# Patient Record
Sex: Female | Born: 1970
Health system: Southern US, Community
[De-identification: ages and names within clinical notes are randomized; demographics above are authoritative.]

## PROBLEM LIST (undated history)

## (undated) DIAGNOSIS — K922 Gastrointestinal hemorrhage, unspecified: Secondary | ICD-10-CM

## (undated) DIAGNOSIS — F259 Schizoaffective disorder, unspecified: Secondary | ICD-10-CM

## (undated) DIAGNOSIS — K921 Melena: Secondary | ICD-10-CM

## (undated) DIAGNOSIS — F319 Bipolar disorder, unspecified: Secondary | ICD-10-CM

## (undated) DIAGNOSIS — T8859XA Other complications of anesthesia, initial encounter: Secondary | ICD-10-CM

## (undated) DIAGNOSIS — T4145XA Adverse effect of unspecified anesthetic, initial encounter: Secondary | ICD-10-CM

## (undated) DIAGNOSIS — C569 Malignant neoplasm of unspecified ovary: Secondary | ICD-10-CM

## (undated) DIAGNOSIS — I2510 Atherosclerotic heart disease of native coronary artery without angina pectoris: Secondary | ICD-10-CM

## (undated) DIAGNOSIS — K219 Gastro-esophageal reflux disease without esophagitis: Secondary | ICD-10-CM

## (undated) DIAGNOSIS — Z9889 Other specified postprocedural states: Secondary | ICD-10-CM

## (undated) DIAGNOSIS — K5792 Diverticulitis of intestine, part unspecified, without perforation or abscess without bleeding: Secondary | ICD-10-CM

## (undated) DIAGNOSIS — R112 Nausea with vomiting, unspecified: Secondary | ICD-10-CM

## (undated) DIAGNOSIS — Z87442 Personal history of urinary calculi: Secondary | ICD-10-CM

## (undated) DIAGNOSIS — E8940 Asymptomatic postprocedural ovarian failure: Secondary | ICD-10-CM

## (undated) DIAGNOSIS — L309 Dermatitis, unspecified: Secondary | ICD-10-CM

## (undated) DIAGNOSIS — D369 Benign neoplasm, unspecified site: Secondary | ICD-10-CM

## (undated) DIAGNOSIS — E785 Hyperlipidemia, unspecified: Secondary | ICD-10-CM

## (undated) DIAGNOSIS — R109 Unspecified abdominal pain: Secondary | ICD-10-CM

## (undated) DIAGNOSIS — G8929 Other chronic pain: Secondary | ICD-10-CM

## (undated) DIAGNOSIS — F988 Other specified behavioral and emotional disorders with onset usually occurring in childhood and adolescence: Secondary | ICD-10-CM

## (undated) HISTORY — DX: Schizoaffective disorder, unspecified: F25.9

## (undated) HISTORY — DX: Bipolar disorder, unspecified: F31.9

## (undated) HISTORY — DX: Benign neoplasm, unspecified site: D36.9

## (undated) HISTORY — DX: Asymptomatic postprocedural ovarian failure: E89.40

## (undated) HISTORY — DX: Atherosclerotic heart disease of native coronary artery without angina pectoris: I25.10

## (undated) HISTORY — DX: Dermatitis, unspecified: L30.9

## (undated) HISTORY — DX: Melena: K92.1

## (undated) HISTORY — DX: Other chronic pain: G89.29

## (undated) HISTORY — DX: Gastrointestinal hemorrhage, unspecified: K92.2

## (undated) HISTORY — DX: Other specified behavioral and emotional disorders with onset usually occurring in childhood and adolescence: F98.8

## (undated) HISTORY — DX: Other chronic pain: R10.9

## (undated) HISTORY — DX: Hyperlipidemia, unspecified: E78.5

## (undated) HISTORY — PX: TUBAL LIGATION: SHX77

## (undated) HISTORY — DX: Malignant neoplasm of unspecified ovary: C56.9

---

## 1998-04-30 ENCOUNTER — Encounter: Admission: RE | Admit: 1998-04-30 | Discharge: 1998-07-29 | Payer: Self-pay | Admitting: *Deleted

## 2002-01-04 HISTORY — PX: APPENDECTOMY: SHX54

## 2005-05-21 ENCOUNTER — Inpatient Hospital Stay (HOSPITAL_COMMUNITY): Admission: RE | Admit: 2005-05-21 | Discharge: 2005-05-23 | Payer: Self-pay | Admitting: *Deleted

## 2005-05-22 ENCOUNTER — Ambulatory Visit: Payer: Self-pay | Admitting: Psychiatry

## 2006-11-17 ENCOUNTER — Encounter: Admission: RE | Admit: 2006-11-17 | Discharge: 2006-11-17 | Payer: Self-pay | Admitting: Family Medicine

## 2006-11-22 ENCOUNTER — Emergency Department (HOSPITAL_COMMUNITY): Admission: EM | Admit: 2006-11-22 | Discharge: 2006-11-22 | Payer: Self-pay | Admitting: Emergency Medicine

## 2008-01-05 DIAGNOSIS — E8940 Asymptomatic postprocedural ovarian failure: Secondary | ICD-10-CM

## 2008-01-05 DIAGNOSIS — C569 Malignant neoplasm of unspecified ovary: Secondary | ICD-10-CM

## 2008-01-05 DIAGNOSIS — D369 Benign neoplasm, unspecified site: Secondary | ICD-10-CM

## 2008-01-05 HISTORY — DX: Asymptomatic postprocedural ovarian failure: E89.40

## 2008-01-05 HISTORY — DX: Benign neoplasm, unspecified site: D36.9

## 2008-01-05 HISTORY — DX: Malignant neoplasm of unspecified ovary: C56.9

## 2008-01-05 HISTORY — PX: ABDOMINAL HYSTERECTOMY: SHX81

## 2008-05-01 ENCOUNTER — Inpatient Hospital Stay (HOSPITAL_COMMUNITY): Admission: EM | Admit: 2008-05-01 | Discharge: 2008-05-06 | Payer: Self-pay | Admitting: Emergency Medicine

## 2008-05-02 ENCOUNTER — Ambulatory Visit: Payer: Self-pay | Admitting: Internal Medicine

## 2008-05-03 ENCOUNTER — Encounter: Payer: Self-pay | Admitting: Internal Medicine

## 2008-05-04 ENCOUNTER — Ambulatory Visit: Payer: Self-pay | Admitting: Gastroenterology

## 2008-05-06 ENCOUNTER — Ambulatory Visit: Payer: Self-pay | Admitting: Gastroenterology

## 2008-05-06 HISTORY — PX: ESOPHAGOGASTRODUODENOSCOPY: SHX1529

## 2008-05-17 ENCOUNTER — Encounter: Payer: Self-pay | Admitting: Gastroenterology

## 2008-06-10 ENCOUNTER — Telehealth (INDEPENDENT_AMBULATORY_CARE_PROVIDER_SITE_OTHER): Payer: Self-pay | Admitting: *Deleted

## 2008-10-28 ENCOUNTER — Telehealth (INDEPENDENT_AMBULATORY_CARE_PROVIDER_SITE_OTHER): Payer: Self-pay

## 2008-10-28 ENCOUNTER — Emergency Department (HOSPITAL_COMMUNITY): Admission: EM | Admit: 2008-10-28 | Discharge: 2008-10-28 | Payer: Self-pay | Admitting: Emergency Medicine

## 2008-10-28 ENCOUNTER — Encounter: Payer: Self-pay | Admitting: Gastroenterology

## 2008-10-28 DIAGNOSIS — K589 Irritable bowel syndrome without diarrhea: Secondary | ICD-10-CM | POA: Insufficient documentation

## 2008-10-28 DIAGNOSIS — R109 Unspecified abdominal pain: Secondary | ICD-10-CM | POA: Insufficient documentation

## 2008-10-30 ENCOUNTER — Encounter: Payer: Self-pay | Admitting: Gastroenterology

## 2008-11-12 ENCOUNTER — Ambulatory Visit: Payer: Self-pay | Admitting: Internal Medicine

## 2008-11-12 ENCOUNTER — Encounter: Payer: Self-pay | Admitting: Gastroenterology

## 2008-11-12 DIAGNOSIS — R933 Abnormal findings on diagnostic imaging of other parts of digestive tract: Secondary | ICD-10-CM | POA: Insufficient documentation

## 2008-11-12 DIAGNOSIS — K5732 Diverticulitis of large intestine without perforation or abscess without bleeding: Secondary | ICD-10-CM | POA: Insufficient documentation

## 2008-11-12 LAB — CONVERTED CEMR LAB
BUN: 9 mg/dL (ref 6–23)
Basophils Absolute: 0 10*3/uL (ref 0.0–0.1)
Basophils Relative: 1 % (ref 0–1)
CO2: 30 meq/L (ref 19–32)
Calcium: 9.1 mg/dL (ref 8.4–10.5)
Chloride: 104 meq/L (ref 96–112)
Creatinine, Ser: 0.75 mg/dL (ref 0.40–1.20)
Eosinophils Absolute: 0.5 10*3/uL (ref 0.0–0.7)
Eosinophils Relative: 7 % — ABNORMAL HIGH (ref 0–5)
Glucose, Bld: 82 mg/dL (ref 70–99)
HCT: 37.1 % (ref 36.0–46.0)
Hemoglobin: 12.7 g/dL (ref 12.0–15.0)
Lymphocytes Relative: 19 % (ref 12–46)
Lymphs Abs: 1.5 10*3/uL (ref 0.7–4.0)
MCHC: 34.3 g/dL (ref 30.0–36.0)
MCV: 92.7 fL (ref 78.0–100.0)
Monocytes Absolute: 0.6 10*3/uL (ref 0.1–1.0)
Monocytes Relative: 8 % (ref 3–12)
Neutro Abs: 5.1 10*3/uL (ref 1.7–7.7)
Neutrophils Relative %: 66 % (ref 43–77)
Platelets: 212 10*3/uL (ref 150–400)
Potassium: 4.3 meq/L (ref 3.5–5.3)
RBC: 4 M/uL (ref 3.87–5.11)
RDW: 12.8 % (ref 11.5–15.5)
Sodium: 140 meq/L (ref 135–145)
WBC: 7.8 10*3/uL (ref 4.0–10.5)

## 2008-11-14 ENCOUNTER — Encounter: Payer: Self-pay | Admitting: Gastroenterology

## 2008-11-15 ENCOUNTER — Encounter: Payer: Self-pay | Admitting: Internal Medicine

## 2008-12-04 HISTORY — PX: COLONOSCOPY: SHX174

## 2008-12-16 ENCOUNTER — Emergency Department (HOSPITAL_COMMUNITY): Admission: EM | Admit: 2008-12-16 | Discharge: 2008-12-16 | Payer: Self-pay | Admitting: Emergency Medicine

## 2008-12-16 ENCOUNTER — Telehealth (INDEPENDENT_AMBULATORY_CARE_PROVIDER_SITE_OTHER): Payer: Self-pay

## 2008-12-17 ENCOUNTER — Encounter: Payer: Self-pay | Admitting: Gastroenterology

## 2008-12-20 ENCOUNTER — Ambulatory Visit (HOSPITAL_COMMUNITY): Admission: RE | Admit: 2008-12-20 | Discharge: 2008-12-20 | Payer: Self-pay | Admitting: Gastroenterology

## 2008-12-20 ENCOUNTER — Ambulatory Visit: Payer: Self-pay | Admitting: Gastroenterology

## 2008-12-26 ENCOUNTER — Encounter: Payer: Self-pay | Admitting: Gastroenterology

## 2009-01-02 ENCOUNTER — Other Ambulatory Visit: Payer: Self-pay | Admitting: General Surgery

## 2009-01-04 HISTORY — PX: SUBTOTAL COLECTOMY: SHX855

## 2009-01-05 ENCOUNTER — Inpatient Hospital Stay (HOSPITAL_COMMUNITY): Admission: EM | Admit: 2009-01-05 | Discharge: 2009-01-12 | Payer: Self-pay | Admitting: Emergency Medicine

## 2009-01-06 ENCOUNTER — Encounter (INDEPENDENT_AMBULATORY_CARE_PROVIDER_SITE_OTHER): Payer: Self-pay | Admitting: General Surgery

## 2009-02-04 ENCOUNTER — Ambulatory Visit: Payer: Self-pay | Admitting: Gastroenterology

## 2009-02-04 DIAGNOSIS — D126 Benign neoplasm of colon, unspecified: Secondary | ICD-10-CM | POA: Insufficient documentation

## 2009-04-21 ENCOUNTER — Ambulatory Visit (HOSPITAL_COMMUNITY): Admission: RE | Admit: 2009-04-21 | Discharge: 2009-04-21 | Payer: Self-pay | Admitting: Psychiatry

## 2009-06-12 ENCOUNTER — Encounter: Admission: RE | Admit: 2009-06-12 | Discharge: 2009-09-10 | Payer: Self-pay | Admitting: Obstetrics and Gynecology

## 2009-06-17 ENCOUNTER — Emergency Department (HOSPITAL_COMMUNITY): Admission: EM | Admit: 2009-06-17 | Discharge: 2009-06-17 | Payer: Self-pay | Admitting: Emergency Medicine

## 2009-07-22 ENCOUNTER — Emergency Department (HOSPITAL_COMMUNITY): Admission: EM | Admit: 2009-07-22 | Discharge: 2009-07-22 | Payer: Self-pay | Admitting: Emergency Medicine

## 2009-08-25 ENCOUNTER — Emergency Department (HOSPITAL_COMMUNITY): Admission: EM | Admit: 2009-08-25 | Discharge: 2009-08-25 | Payer: Self-pay | Admitting: Emergency Medicine

## 2009-08-30 ENCOUNTER — Emergency Department (HOSPITAL_COMMUNITY): Admission: EM | Admit: 2009-08-30 | Discharge: 2009-08-30 | Payer: Self-pay | Admitting: Emergency Medicine

## 2009-09-12 ENCOUNTER — Encounter: Payer: Self-pay | Admitting: Gastroenterology

## 2009-11-24 ENCOUNTER — Emergency Department (HOSPITAL_COMMUNITY): Admission: EM | Admit: 2009-11-24 | Discharge: 2009-11-24 | Payer: Self-pay | Admitting: Emergency Medicine

## 2010-01-04 DIAGNOSIS — L309 Dermatitis, unspecified: Secondary | ICD-10-CM

## 2010-01-04 HISTORY — DX: Dermatitis, unspecified: L30.9

## 2010-02-03 NOTE — Consult Note (Signed)
Summary: Consultation Report  Consultation Report   Imported By: Diana Eves 05/03/2008 14:58:10  _____________________________________________________________________  External Attachment:    Type:   Image     Comment:   External Document

## 2010-02-03 NOTE — Progress Notes (Signed)
  Phone Note Call from Patient   Caller: Patient Summary of Call: Pt called from [facility] ER, said she is scheduled for TCS Fri and has not gotten her Trilyte Prep yet. She is requesting the pill. Said there is no way she can drink the solution and she really does need to have the TCS. Please advise. Initial call taken by: Cloria Spring LPN,  December 16, 2008 2:57 PM     Appended Document:  Osmoprep okay. Pt NEEDS TO DRINL PLENTY OF FLUIDS. URINE SHOULD BE LIGHT YELLOW.  Appended Document:  LMOM for pt to call.  Appended Document:  Pt informed. [facility] [place].

## 2010-02-03 NOTE — Assessment & Plan Note (Signed)
Summary: diverticulitis,consult for colonoscopy/ams   Visit Type:  f/u Primary Care Provider:  Belva Agee, NP  Chief Complaint:  diverticulitis.  History of Present Illness: [name] is a pleasant 40 y/o female with h/o recurrent diverticulitis who presents for follow-up visit.  She called on 10/28/08 with c/o recurrent llq pain, loose dark stools similar to her previous diverticulitis episodes.  She was hospitalized in 4/10 with diverticulitis.  She had episode in late 2008 or early 2009 at [facility] as well.  Dr. Darrick Penna recommended STAT CT A/P but before this was able to be done, she went to the ED at [facility] for evaluation.  CT from 10/28/08 showed extensive inflammatory change involving the sigmoid colon most c/w sigmoid diverticulitis.  No definite abscess or evidence of perforation was seen.  WBC 11,800, H/H 12.9/36.8, postassium 3.5.  She was given Augmentin for 7 days.  She continues to have low-grade fever 99-100 Farenheit.  Her LLQ pain is improved but persistent.  She has some radiation to the mid abdomen as well.  She cannot tolerate the oral Dilaudid she was given.  She says it is too strong.  Denies n/v.  She is having one loose dark stool daily.  She has mucous in the stool but no frank blood.  She c/o feeling fatigued.     Current Medications (verified): 1)  Lithium .... One Tablet At Bedtime 2)  Lamictal .... One Tablet Daily 3)  Alprazolam 2 Mg Tabs (Alprazolam) .... Take 1 Tablet By Mouth Three Times A Day 4)  Ambien Cr 12.5 Mg Cr-Tabs (Zolpidem Tartrate) .... Take 1 Tab By Mouth At Bedtime As Needed 5)  Macrobid 100 Mg Caps (Nitrofurantoin Monohyd Macro) .... One Tablet Nightly  Allergies (verified): No Known Drug Allergies  Past History:  Past Medical History: Bipolar d/o with psychiatric admission in 2007 Frequent UTIs Recurrent diverticulitis, first episode at [facility] requiring hospitalization about 2 years ago.  Hospitalized in 4/10 at [facility] with right sided  diverticulitis and possible focal colitis.  Current CT 10/10, sigmoid diverticulitis. EGD 05/06/08, Dr. Darrick Penna, for coffee-ground emesis and hematemesis, showed hiatal hernia only.  Past Surgical History: C-section Appendectomy Hysterectomy  Family History: Negative for chronic GI illnesses, liver dz, crc.  Social History: Married.  Lives in [place] with husband and  3 sons, ages 68, 25, 6.   Currently unemployed. 1/2 ppd.  No alcohol or drug use.  Review of Systems General:  Complains of fever and fatigue; denies chills, sweats, anorexia, weakness, and weight loss. Eyes:  Denies vision loss. ENT:  Denies nasal congestion, sore throat, hoarseness, and difficulty swallowing. CV:  Denies chest pains, angina, palpitations, dyspnea on exertion, and peripheral edema. Resp:  Denies dyspnea at rest, dyspnea with exercise, and cough. GI:  Complains of abdominal pain, gas/bloating, diarrhea, and change in bowel habits; denies difficulty swallowing, pain on swallowing, nausea, indigestion/heartburn, vomiting, jaundice, constipation, bloody BM's, black BMs, and fecal incontinence. GU:  Denies urinary burning and blood in urine. MS:  Denies joint pain / LOM. Derm:  Denies rash and itching. Neuro:  Denies frequent headaches, memory loss, and confusion. Psych:  Denies depression and anxiety. Endo:  Denies unusual weight change. Heme:  Denies bruising and bleeding. Allergy:  Denies hives and rash.  Vital Signs:  Patient profile:   40 year old female Height:      63 inches Weight:      114 pounds BMI:     20.27 Temp:     98.7 degrees F oral  Pulse rate:   64 / minute BP sitting:   100 / 70  (left arm) Cuff size:   regular  Vitals Entered By: Cloria Spring LPN (November 12, 2008 9:29 AM)  Physical Exam  General:  Well developed, well nourished, no acute distress. Head:  Normocephalic and atraumatic. Eyes:  Conjunctivae pink, no scleral icterus.  Mouth:  Oropharyngeal mucosa moist,  pink.  No lesions, erythema or exudate.    Neck:  Supple; no masses or thyromegaly. Lungs:  Clear throughout to auscultation. Heart:  Regular rate and rhythm; no murmurs, rubs,  or bruits. Abdomen:  Soft.  Flat.  ND.  Positive BS.  Moderate tenderness in llq and suprapubic region.  No rebound.  Subjective guarding.  No HSM or masses. Extremities:  No clubbing, cyanosis, edema or deformities noted. Neurologic:  Alert and  oriented x4;  grossly normal neurologically. Skin:  Intact without significant lesions or rashes. Cervical Nodes:  No significant cervical adenopathy. Psych:  Alert and cooperative. Normal mood and affect.  Impression & Recommendations:  Problem # 1:  DIVERTICULITIS, COLON (ICD-562.11) Recurrent diverticulitis, third epidsode. 2 episodes in the last 7 months. Notably her last episode was right-sided, the current episode is in  sigmoid region. We'll request records from [facility] to see where the diverticulitis was located during her first episode around 2008. She been off antibiotics now for one week. She continues to have ongoing left lower quadrant abdominal pain and low-grade fever. She notes that her symptoms have improved but are still persistent. She has moderate pain on exam. She is afebrile currently. At this point in time she wants to continue outpatient treatment. She likely has an element of persisting diverticulitis having received a 7 day course of Augmentin. We discussed the possibility of starting Cipro and Flagyl along with blood work, however if she is not significantly improved in the next 48-72 hours then would pursue repeat CT scan to rule out diverticular abscess. Our goal is to plan on colonoscopy approximately the third week of December assuming that this current episode of diverticulitis resolves.  Colonoscopy to be performed in near future.  Risks, alternatives, and benefits including but not limited to the risk of reaction to medication,  bleeding, infection, and perforation were addressed.  Patient voiced understanding and provided verbal consent.  Orders: T-CBC w/Diff ([id number]) T-Basic Metabolic Panel ([id number]) Est. Patient Level IV (29562) Prescriptions: OXYCODONE-ACETAMINOPHEN 5-325 MG TABS (OXYCODONE-ACETAMINOPHEN) one by mouth every 4-6 hours as needed pain, may make drowsy  #30 x 0   Entered and Authorized by:   Leanna Battles. Dixon Boos   Signed by:   Leanna Battles Lavarius Doughten PA-C on 11/12/2008   Method used:   Print then Give to Patient   RxID:   [id number] METRONIDAZOLE 500 MG TABS (METRONIDAZOLE) one by mouth three times a day with food for two weeks  #42 x 0   Entered and Authorized by:   Leanna Battles. Dixon Boos   Signed by:   Leanna Battles Ulysess Witz PA-C on 11/12/2008   Method used:   Print then Give to Patient   RxID:   [id number] CIPROFLOXACIN HCL 500 MG TABS (CIPROFLOXACIN HCL) one by mouth two times a day for two weeks  #28 x 0   Entered and Authorized by:   Leanna Battles. Dixon Boos   Signed by:   Leanna Battles Kandra Graven PA-C on 11/12/2008   Method used:   Print then Give to Patient   RxID:   [id number]  Appended Document: diverticulitis,consult for colonoscopy/ams Reviewed records from [facility] CT 02/23/2006:  Prelim report stated inflammatory changes involving mid transverse colon and few diverticula seen there s/o diverticulitis.  Final report did not mention diverticulitis.

## 2010-02-03 NOTE — Progress Notes (Signed)
Summary: abd pain, dark stools  Phone Note Call from Patient Call back at Home Phone [phone]   Caller: Patient Summary of Call: pt called- feels like she is having an episode of diverticulitis. she is having nausea, lower left abd pain and dark stools. She doesnt know if she has a fever or not. pt has ov appt on 11/04/08. wants to know if there is anything she can do now. please advise Initial call taken by: Hendricks Limes LPN,  October 28, 2008 9:11 AM     Appended Document: abd pain, dark stools Pt needs STAT CT Scan A/P w/ iv contrast, Dx: abd pain and diarhhea, UJ:[id number]. nEEDS cdiff TOXIN X3, AND FECAL LACTOFERRIN.  Appended Document: abd pain, dark stools pt aware- stool containers at front desk. pt to come to [facility] for CT.   Appended Document: abd pain, dark stools pt instructed to go to [facility]. order faxed to radiology for stat ct scan

## 2010-02-03 NOTE — Assessment & Plan Note (Signed)
Summary: ABD PAIN/ SUBTOTAL COLECTOMY-DIVERTICULITIS JAN 3   Visit Type:  Follow-up Visit Primary Care Provider:  [facility]  Chief Complaint:  F/U abd pain.  History of Present Illness: Since surgery, having back pain. Gets up in the middle of the night. No numbness or tinlging in legs. No lower extremity weakness. Bowels are still kind of loose. Taking Benefiber tabs as needed. BM 3x/day. Still c/o pain: Rx-Vicodon no help, Percocet: no help.   Current Medications (verified): 1)  Lithium .... One Tablet At Bedtime 2)  Alprazolam 2 Mg Tabs (Alprazolam) .... Take 1 Tablet By Mouth Three Times A Day As Needed 3)  Ambien Cr 12.5 Mg Cr-Tabs (Zolpidem Tartrate) .... Take 1 Tab By Mouth At Bedtime As Needed 4)  Oxycodone-Acetaminophen 5-325 Mg Tabs (Oxycodone-Acetaminophen) .... One By Mouth Every 4-6 Hours As Needed Pain, May Make Drowsy  Allergies (verified): No Known Drug Allergies  Past History:  Past Medical History: Bipolar d/o with psychiatric admission in 2007 Frequent UTIs DEC 2010 TCS: SIMPLE ADENOMA, NO MICROSCOPIC COLITIS CHRONIC Abdominal pain, multifactorial: IBS-diarrhea predominant, bile salt-induced diarrhea? EGD 05/06/08, Dr. Darrick Penna, for coffee-ground emesis and hematemesis, showed hiatal hernia only. Recurrent diverticulitis, first episode at [facility] requiring hospitalization about 2 years ago. APR 2010: right sided diverticulitis,  CT 10/10, sigmoid diverticulitis-->subtotal colectomy 2011, Dr. Lovell Sheehan.  Past Surgical History: C-section Appendectomy Hysterectomy Subtotal colectomy 2011, Dr. Lovell Sheehan.  Vital Signs:  Patient profile:   40 year old female Height:      63 inches Weight:      106.50 pounds BMI:     18.93 Temp:     98.0 degrees F oral Pulse rate:   72 / minute BP sitting:   100 / 70  (left arm) Cuff size:   regular  Vitals Entered By: Cloria Spring LPN (February 04, 2009 9:37 AM)  Physical Exam  General:  Well developed, well nourished, no acute  distress. Head:  Normocephalic and atraumatic. Eyes:  PERRLA, no icterus. Lungs:  Clear throughout to auscultation. Heart:  Regular rate and rhythm; no murmurs Abdomen:  Soft.  Flat.  ND.  Positive BS.  Moderate tenderness x all 4 quadrants.  No rebound or guarding.    Impression & Recommendations:  Problem # 1:  ABDOMINAL PAIN (ICD-789.00)  Benefiber at least once a day. May liberalize diet.  2 TUMS with every meal.  Refer to [facility] OR DR. Lovell Sheehan for back pain. OPV in 4 mos.  Orders: Est. Patient Level III (16109)  Problem # 2:  BENIGN NEOPLASM OF COLON (ICD-211.3) S/P SUBTOTAL COLECTOMY. FLEX SIG in 5 years. Continue fiber. OPV as needed. 1st degree relatives need TCS at age 28 then every 5 years.  CC: PCP and Dr. Lovell Sheehan Prescriptions: AMITRIPTYLINE HCL 25 MG TABS (AMITRIPTYLINE HCL) 1 by mouth at bedtime for 7 days then 2 by mouth qhs  #60 x 5   Entered and Authorized by:   West Bali MD   Signed by:   West Bali MD on 02/04/2009   Method used:   [phone][facility] (retail)       [address]       [place], [place]  [address]       Ph: [phone] or [phone]       Fax: [phone]   RxID:   [id number]

## 2010-02-03 NOTE — Letter (Signed)
Summary: Internal Other /Change in Prep /TCS  Internal Other /Change in Prep /TCS   Imported By: Cloria Spring LPN 61/60/7371 06:26:94  _____________________________________________________________________  External Attachment:    Type:   Image     Comment:   External Document

## 2010-02-03 NOTE — Letter (Signed)
Summary: MEDICAL RECORDS TO LAWYERS OFFICE  MEDICAL RECORDS TO LAWYERS OFFICE   Imported By: Rexene Alberts 09/12/2009 13:59:29  _____________________________________________________________________  External Attachment:    Type:   Image     Comment:   External Document

## 2010-02-03 NOTE — Letter (Signed)
Summary: ER NOTES  ER NOTES   Imported By: Diana Eves 11/15/2008 13:39:16  _____________________________________________________________________  External Attachment:    Type:   Image     Comment:   External Document

## 2010-02-03 NOTE — Miscellaneous (Signed)
Summary: c-diff and wbc order

## 2010-02-03 NOTE — Letter (Signed)
Summary: CT SCAN ORDER  CT SCAN ORDER   Imported By: Ave Filter 10/30/2008 08:47:44  _____________________________________________________________________  External Attachment:    Type:   Image     Comment:   External Document

## 2010-02-03 NOTE — Medication Information (Signed)
Summary: zofran prior auth  zofran prior auth   Imported By: Hendricks Limes LPN 16/10/9602 54:09:81  _____________________________________________________________________  External Attachment:    Type:   Image     Comment:   External Document

## 2010-02-03 NOTE — Progress Notes (Signed)
Summary: Ruth Duncan Requesting appt with SM only  Phone Note Call from Patient Call back at [facility] Phone [phone]   Caller: Patient Summary of Call: Pt called and rs her hosp f/u with [place].  She does not want RMR as her doc even though he did the initial consult.  She said that SM did the procedure and she really likes her.  She has no problem with RMR but really likes SM and feels comfortable with her.  She changed her appt and would not rs with RMR. Initial call taken by: Elinor Parkinson,  June 10, 2008 1:55 PM      Appended Document: Ruth Duncan Requesting appt with SM only Ok.

## 2010-02-03 NOTE — Letter (Signed)
Summary: TCS ORDER  TCS ORDER   Imported By: Ave Filter 11/14/2008 10:40:55  _____________________________________________________________________  External Attachment:    Type:   Image     Comment:   External Document

## 2010-03-17 LAB — DIFFERENTIAL
Basophils Absolute: 0.1 10*3/uL (ref 0.0–0.1)
Basophils Relative: 1 % (ref 0–1)
Eosinophils Absolute: 0.1 10*3/uL (ref 0.0–0.7)
Eosinophils Relative: 2 % (ref 0–5)
Lymphocytes Relative: 19 % (ref 12–46)
Lymphs Abs: 1.4 10*3/uL (ref 0.7–4.0)
Monocytes Absolute: 0.8 10*3/uL (ref 0.1–1.0)
Monocytes Relative: 10 % (ref 3–12)
Neutro Abs: 5.3 10*3/uL (ref 1.7–7.7)
Neutrophils Relative %: 69 % (ref 43–77)

## 2010-03-17 LAB — COMPREHENSIVE METABOLIC PANEL
ALT: 11 U/L (ref 0–35)
AST: 19 U/L (ref 0–37)
Albumin: 3.4 g/dL — ABNORMAL LOW (ref 3.5–5.2)
Alkaline Phosphatase: 64 U/L (ref 39–117)
BUN: 3 mg/dL — ABNORMAL LOW (ref 6–23)
CO2: 24 mEq/L (ref 19–32)
Calcium: 8.8 mg/dL (ref 8.4–10.5)
Chloride: 106 mEq/L (ref 96–112)
Creatinine, Ser: 0.77 mg/dL (ref 0.4–1.2)
GFR calc Af Amer: >60 mL/min (ref >60)
GFR calc non Af Amer: >60 mL/min (ref >60)
Glucose, Bld: 84 mg/dL (ref 70–99)
Potassium: 3.5 mEq/L (ref 3.5–5.1)
Sodium: 137 mEq/L (ref 135–145)
Total Bilirubin: 0.4 mg/dL (ref 0.3–1.2)
Total Protein: 6.4 g/dL (ref 6.0–8.3)

## 2010-03-17 LAB — URINALYSIS, ROUTINE W REFLEX MICROSCOPIC
Bilirubin Urine: NEGATIVE
Glucose, UA: NEGATIVE mg/dL
Ketones, ur: NEGATIVE mg/dL
Nitrite: POSITIVE — AB
Protein, ur: NEGATIVE mg/dL
Specific Gravity, Urine: 1.02 (ref 1.005–1.030)
Urobilinogen, UA: 0.2 mg/dL (ref 0.0–1.0)
pH: 6.5 (ref 5.0–8.0)

## 2010-03-17 LAB — CBC
HCT: 41 % (ref 36.0–46.0)
Hemoglobin: 14.1 g/dL (ref 12.0–15.0)
MCH: 31.7 pg (ref 26.0–34.0)
MCHC: 34.3 g/dL (ref 30.0–36.0)
MCV: 92.5 fL (ref 78.0–100.0)
Platelets: 288 10*3/uL (ref 150–400)
RBC: 4.43 MIL/uL (ref 3.87–5.11)
RDW: 13.3 % (ref 11.5–15.5)
WBC: 7.8 10*3/uL (ref 4.0–10.5)

## 2010-03-17 LAB — LIPASE, BLOOD: Lipase: 25 U/L (ref 11–59)

## 2010-03-17 LAB — POCT CARDIAC MARKERS
CKMB, poc: <1 ng/mL — ABNORMAL LOW (ref 1.0–8.0)
Myoglobin, poc: 21 ng/mL (ref 12–200)
Troponin i, poc: <0.05 ng/mL (ref 0.00–0.09)

## 2010-03-17 LAB — URINE MICROSCOPIC-ADD ON

## 2010-03-20 LAB — POCT CARDIAC MARKERS
CKMB, poc: <1 ng/mL — ABNORMAL LOW (ref 1.0–8.0)
CKMB, poc: <1 ng/mL — ABNORMAL LOW (ref 1.0–8.0)
Myoglobin, poc: 38.7 ng/mL (ref 12–200)
Myoglobin, poc: 52.4 ng/mL (ref 12–200)
Troponin i, poc: <0.05 ng/mL (ref 0.00–0.09)
Troponin i, poc: <0.05 ng/mL (ref 0.00–0.09)

## 2010-03-20 LAB — URINALYSIS, ROUTINE W REFLEX MICROSCOPIC
Glucose, UA: 250 mg/dL — AB
Ketones, ur: 15 mg/dL — AB
Nitrite: POSITIVE — AB
Protein, ur: >300 mg/dL — AB
Specific Gravity, Urine: 1.02 (ref 1.005–1.030)
Urobilinogen, UA: >8 mg/dL — ABNORMAL HIGH (ref 0.0–1.0)
pH: 6.5 (ref 5.0–8.0)

## 2010-03-20 LAB — BASIC METABOLIC PANEL
BUN: 11 mg/dL (ref 6–23)
BUN: 14 mg/dL (ref 6–23)
CO2: 26 mEq/L (ref 19–32)
CO2: 28 mEq/L (ref 19–32)
Calcium: 9.1 mg/dL (ref 8.4–10.5)
Calcium: 9.2 mg/dL (ref 8.4–10.5)
Chloride: 104 mEq/L (ref 96–112)
Chloride: 105 mEq/L (ref 96–112)
Creatinine, Ser: 0.59 mg/dL (ref 0.4–1.2)
Creatinine, Ser: 0.63 mg/dL (ref 0.4–1.2)
GFR calc Af Amer: >60 mL/min (ref >60)
GFR calc Af Amer: >60 mL/min (ref >60)
GFR calc non Af Amer: >60 mL/min (ref >60)
GFR calc non Af Amer: >60 mL/min (ref >60)
Glucose, Bld: 87 mg/dL (ref 70–99)
Glucose, Bld: 88 mg/dL (ref 70–99)
Potassium: 3.4 mEq/L — ABNORMAL LOW (ref 3.5–5.1)
Potassium: 3.7 mEq/L (ref 3.5–5.1)
Sodium: 137 mEq/L (ref 135–145)
Sodium: 139 mEq/L (ref 135–145)

## 2010-03-20 LAB — DIFFERENTIAL
Basophils Absolute: 0.1 10*3/uL (ref 0.0–0.1)
Basophils Absolute: 0.3 10*3/uL — ABNORMAL HIGH (ref 0.0–0.1)
Basophils Relative: 1 % (ref 0–1)
Basophils Relative: 4 % — ABNORMAL HIGH (ref 0–1)
Eosinophils Absolute: 0.3 10*3/uL (ref 0.0–0.7)
Eosinophils Absolute: 0.4 10*3/uL (ref 0.0–0.7)
Eosinophils Relative: 3 % (ref 0–5)
Eosinophils Relative: 4 % (ref 0–5)
Lymphocytes Relative: 16 % (ref 12–46)
Lymphocytes Relative: 17 % (ref 12–46)
Lymphs Abs: 1.6 10*3/uL (ref 0.7–4.0)
Lymphs Abs: 1.6 10*3/uL (ref 0.7–4.0)
Monocytes Absolute: 0.6 10*3/uL (ref 0.1–1.0)
Monocytes Absolute: 0.8 10*3/uL (ref 0.1–1.0)
Monocytes Relative: 6 % (ref 3–12)
Monocytes Relative: 8 % (ref 3–12)
Neutro Abs: 6.7 10*3/uL (ref 1.7–7.7)
Neutro Abs: 7 10*3/uL (ref 1.7–7.7)
Neutrophils Relative %: 69 % (ref 43–77)
Neutrophils Relative %: 72 % (ref 43–77)

## 2010-03-20 LAB — CBC
HCT: 38.5 % (ref 36.0–46.0)
HCT: 38.5 % (ref 36.0–46.0)
Hemoglobin: 12.9 g/dL (ref 12.0–15.0)
Hemoglobin: 13.3 g/dL (ref 12.0–15.0)
MCH: 31.1 pg (ref 26.0–34.0)
MCH: 32 pg (ref 26.0–34.0)
MCHC: 33.4 g/dL (ref 30.0–36.0)
MCHC: 34.6 g/dL (ref 30.0–36.0)
MCV: 92.5 fL (ref 78.0–100.0)
MCV: 93.1 fL (ref 78.0–100.0)
Platelets: 258 10*3/uL (ref 150–400)
Platelets: 262 10*3/uL (ref 150–400)
RBC: 4.14 MIL/uL (ref 3.87–5.11)
RBC: 4.16 MIL/uL (ref 3.87–5.11)
RDW: 12.4 % (ref 11.5–15.5)
RDW: 12.5 % (ref 11.5–15.5)
WBC: 9.6 10*3/uL (ref 4.0–10.5)
WBC: 9.8 10*3/uL (ref 4.0–10.5)

## 2010-03-20 LAB — URINE MICROSCOPIC-ADD ON

## 2010-03-20 LAB — URINE CULTURE
Colony Count: NO GROWTH
Culture  Setup Time: 201108272014
Culture: NO GROWTH

## 2010-03-20 LAB — D-DIMER, QUANTITATIVE: D-Dimer, Quant: 0.32 ug/mL-FEU (ref 0.00–0.48)

## 2010-03-21 LAB — DIFFERENTIAL
Basophils Absolute: 0.1 10*3/uL (ref 0.0–0.1)
Basophils Relative: 1 % (ref 0–1)
Eosinophils Absolute: 0.3 10*3/uL (ref 0.0–0.7)
Eosinophils Relative: 6 % — ABNORMAL HIGH (ref 0–5)
Lymphocytes Relative: 23 % (ref 12–46)
Lymphs Abs: 1.2 10*3/uL (ref 0.7–4.0)
Monocytes Absolute: 0.4 10*3/uL (ref 0.1–1.0)
Monocytes Relative: 8 % (ref 3–12)
Neutro Abs: 3.3 10*3/uL (ref 1.7–7.7)
Neutrophils Relative %: 62 % (ref 43–77)

## 2010-03-21 LAB — URINALYSIS, ROUTINE W REFLEX MICROSCOPIC
Bilirubin Urine: NEGATIVE
Glucose, UA: NEGATIVE mg/dL
Hgb urine dipstick: NEGATIVE
Ketones, ur: NEGATIVE mg/dL
Nitrite: NEGATIVE
Protein, ur: NEGATIVE mg/dL
Specific Gravity, Urine: 1.01 (ref 1.005–1.030)
Urobilinogen, UA: 0.2 mg/dL (ref 0.0–1.0)
pH: 7.5 (ref 5.0–8.0)

## 2010-03-21 LAB — POCT CARDIAC MARKERS
CKMB, poc: <1 ng/mL — ABNORMAL LOW (ref 1.0–8.0)
CKMB, poc: <1 ng/mL — ABNORMAL LOW (ref 1.0–8.0)
Myoglobin, poc: 25.5 ng/mL (ref 12–200)
Myoglobin, poc: 47.7 ng/mL (ref 12–200)
Troponin i, poc: <0.05 ng/mL (ref 0.00–0.09)
Troponin i, poc: <0.05 ng/mL (ref 0.00–0.09)

## 2010-03-21 LAB — CBC
HCT: 39.1 % (ref 36.0–46.0)
Hemoglobin: 13.6 g/dL (ref 12.0–15.0)
MCH: 32.1 pg (ref 26.0–34.0)
MCHC: 34.7 g/dL (ref 30.0–36.0)
MCV: 92.4 fL (ref 78.0–100.0)
Platelets: 231 10*3/uL (ref 150–400)
RBC: 4.23 MIL/uL (ref 3.87–5.11)
RDW: 13.1 % (ref 11.5–15.5)
WBC: 5.3 10*3/uL (ref 4.0–10.5)

## 2010-03-21 LAB — COMPREHENSIVE METABOLIC PANEL
ALT: 11 U/L (ref 0–35)
AST: 15 U/L (ref 0–37)
Albumin: 4.3 g/dL (ref 3.5–5.2)
Alkaline Phosphatase: 52 U/L (ref 39–117)
BUN: 16 mg/dL (ref 6–23)
CO2: 26 mEq/L (ref 19–32)
Calcium: 8.9 mg/dL (ref 8.4–10.5)
Chloride: 106 mEq/L (ref 96–112)
Creatinine, Ser: 0.74 mg/dL (ref 0.4–1.2)
GFR calc Af Amer: >60 mL/min (ref >60)
GFR calc non Af Amer: >60 mL/min (ref >60)
Glucose, Bld: 89 mg/dL (ref 70–99)
Potassium: 4.1 mEq/L (ref 3.5–5.1)
Sodium: 138 mEq/L (ref 135–145)
Total Bilirubin: 0.5 mg/dL (ref 0.3–1.2)
Total Protein: 7.7 g/dL (ref 6.0–8.3)

## 2010-03-21 LAB — LIPASE, BLOOD: Lipase: 29 U/L (ref 11–59)

## 2010-03-21 LAB — LITHIUM LEVEL: Lithium Lvl: <0.25 mEq/L — ABNORMAL LOW (ref 0.80–1.40)

## 2010-03-22 LAB — CBC
HCT: 29.2 % — ABNORMAL LOW (ref 36.0–46.0)
HCT: 29.4 % — ABNORMAL LOW (ref 36.0–46.0)
HCT: 30.9 % — ABNORMAL LOW (ref 36.0–46.0)
HCT: 32.2 % — ABNORMAL LOW (ref 36.0–46.0)
HCT: 40.3 % (ref 36.0–46.0)
Hemoglobin: 10 g/dL — ABNORMAL LOW (ref 12.0–15.0)
Hemoglobin: 10.1 g/dL — ABNORMAL LOW (ref 12.0–15.0)
Hemoglobin: 10.6 g/dL — ABNORMAL LOW (ref 12.0–15.0)
Hemoglobin: 11 g/dL — ABNORMAL LOW (ref 12.0–15.0)
Hemoglobin: 14 g/dL (ref 12.0–15.0)
MCHC: 34.1 g/dL (ref 30.0–36.0)
MCHC: 34.2 g/dL (ref 30.0–36.0)
MCHC: 34.4 g/dL (ref 30.0–36.0)
MCHC: 34.5 g/dL (ref 30.0–36.0)
MCHC: 34.7 g/dL (ref 30.0–36.0)
MCV: 91.1 fL (ref 78.0–100.0)
MCV: 91.2 fL (ref 78.0–100.0)
MCV: 91.5 fL (ref 78.0–100.0)
MCV: 91.9 fL (ref 78.0–100.0)
MCV: 92.3 fL (ref 78.0–100.0)
Platelets: 159 10*3/uL (ref 150–400)
Platelets: 178 10*3/uL (ref 150–400)
Platelets: 179 10*3/uL (ref 150–400)
Platelets: 184 10*3/uL (ref 150–400)
Platelets: 228 10*3/uL (ref 150–400)
RBC: 3.18 MIL/uL — ABNORMAL LOW (ref 3.87–5.11)
RBC: 3.22 MIL/uL — ABNORMAL LOW (ref 3.87–5.11)
RBC: 3.38 MIL/uL — ABNORMAL LOW (ref 3.87–5.11)
RBC: 3.49 MIL/uL — ABNORMAL LOW (ref 3.87–5.11)
RBC: 4.43 MIL/uL (ref 3.87–5.11)
RDW: 12.4 % (ref 11.5–15.5)
RDW: 12.5 % (ref 11.5–15.5)
RDW: 12.6 % (ref 11.5–15.5)
RDW: 12.6 % (ref 11.5–15.5)
RDW: 12.6 % (ref 11.5–15.5)
WBC: 11.8 10*3/uL — ABNORMAL HIGH (ref 4.0–10.5)
WBC: 12.9 10*3/uL — ABNORMAL HIGH (ref 4.0–10.5)
WBC: 6.1 10*3/uL (ref 4.0–10.5)
WBC: 7.6 10*3/uL (ref 4.0–10.5)
WBC: 8.9 10*3/uL (ref 4.0–10.5)

## 2010-03-22 LAB — DIFFERENTIAL
Basophils Absolute: 0 10*3/uL (ref 0.0–0.1)
Basophils Absolute: 0 10*3/uL (ref 0.0–0.1)
Basophils Absolute: 0 10*3/uL (ref 0.0–0.1)
Basophils Absolute: 0 10*3/uL (ref 0.0–0.1)
Basophils Absolute: 0.1 10*3/uL (ref 0.0–0.1)
Basophils Relative: 0 % (ref 0–1)
Basophils Relative: 0 % (ref 0–1)
Basophils Relative: 0 % (ref 0–1)
Basophils Relative: 1 % (ref 0–1)
Basophils Relative: 1 % (ref 0–1)
Eosinophils Absolute: 0.1 10*3/uL (ref 0.0–0.7)
Eosinophils Absolute: 0.2 10*3/uL (ref 0.0–0.7)
Eosinophils Absolute: 0.3 10*3/uL (ref 0.0–0.7)
Eosinophils Absolute: 0.4 10*3/uL (ref 0.0–0.7)
Eosinophils Absolute: 0.6 10*3/uL (ref 0.0–0.7)
Eosinophils Relative: 1 % (ref 0–5)
Eosinophils Relative: 2 % (ref 0–5)
Eosinophils Relative: 4 % (ref 0–5)
Eosinophils Relative: 6 % — ABNORMAL HIGH (ref 0–5)
Eosinophils Relative: 7 % — ABNORMAL HIGH (ref 0–5)
Lymphocytes Relative: 12 % (ref 12–46)
Lymphocytes Relative: 25 % (ref 12–46)
Lymphocytes Relative: 41 % (ref 12–46)
Lymphocytes Relative: 9 % — ABNORMAL LOW (ref 12–46)
Lymphocytes Relative: 9 % — ABNORMAL LOW (ref 12–46)
Lymphs Abs: 0.7 10*3/uL (ref 0.7–4.0)
Lymphs Abs: 1 10*3/uL (ref 0.7–4.0)
Lymphs Abs: 1.6 10*3/uL (ref 0.7–4.0)
Lymphs Abs: 2.2 10*3/uL (ref 0.7–4.0)
Lymphs Abs: 2.5 10*3/uL (ref 0.7–4.0)
Monocytes Absolute: 0.4 10*3/uL (ref 0.1–1.0)
Monocytes Absolute: 0.6 10*3/uL (ref 0.1–1.0)
Monocytes Absolute: 0.9 10*3/uL (ref 0.1–1.0)
Monocytes Absolute: 0.9 10*3/uL (ref 0.1–1.0)
Monocytes Absolute: 1.2 10*3/uL — ABNORMAL HIGH (ref 0.1–1.0)
Monocytes Relative: 10 % (ref 3–12)
Monocytes Relative: 12 % (ref 3–12)
Monocytes Relative: 6 % (ref 3–12)
Monocytes Relative: 7 % (ref 3–12)
Monocytes Relative: 7 % (ref 3–12)
Neutro Abs: 10.3 10*3/uL — ABNORMAL HIGH (ref 1.7–7.7)
Neutro Abs: 2.7 10*3/uL (ref 1.7–7.7)
Neutro Abs: 5.5 10*3/uL (ref 1.7–7.7)
Neutro Abs: 5.8 10*3/uL (ref 1.7–7.7)
Neutro Abs: 9.4 10*3/uL — ABNORMAL HIGH (ref 1.7–7.7)
Neutrophils Relative %: 45 % (ref 43–77)
Neutrophils Relative %: 62 % (ref 43–77)
Neutrophils Relative %: 76 % (ref 43–77)
Neutrophils Relative %: 80 % — ABNORMAL HIGH (ref 43–77)
Neutrophils Relative %: 80 % — ABNORMAL HIGH (ref 43–77)

## 2010-03-22 LAB — BASIC METABOLIC PANEL
BUN: 12 mg/dL (ref 6–23)
BUN: 3 mg/dL — ABNORMAL LOW (ref 6–23)
BUN: 3 mg/dL — ABNORMAL LOW (ref 6–23)
BUN: 8 mg/dL (ref 6–23)
BUN: 8 mg/dL (ref 6–23)
CO2: 28 mEq/L (ref 19–32)
CO2: 28 mEq/L (ref 19–32)
CO2: 29 mEq/L (ref 19–32)
CO2: 31 mEq/L (ref 19–32)
CO2: 32 mEq/L (ref 19–32)
Calcium: 7.7 mg/dL — ABNORMAL LOW (ref 8.4–10.5)
Calcium: 7.9 mg/dL — ABNORMAL LOW (ref 8.4–10.5)
Calcium: 8 mg/dL — ABNORMAL LOW (ref 8.4–10.5)
Calcium: 8.1 mg/dL — ABNORMAL LOW (ref 8.4–10.5)
Calcium: 9 mg/dL (ref 8.4–10.5)
Chloride: 102 mEq/L (ref 96–112)
Chloride: 103 mEq/L (ref 96–112)
Chloride: 105 mEq/L (ref 96–112)
Chloride: 98 mEq/L (ref 96–112)
Chloride: 99 mEq/L (ref 96–112)
Creatinine, Ser: 0.63 mg/dL (ref 0.4–1.2)
Creatinine, Ser: 0.63 mg/dL (ref 0.4–1.2)
Creatinine, Ser: 0.68 mg/dL (ref 0.4–1.2)
Creatinine, Ser: 0.72 mg/dL (ref 0.4–1.2)
Creatinine, Ser: 0.77 mg/dL (ref 0.4–1.2)
GFR calc Af Amer: >60 mL/min (ref >60)
GFR calc Af Amer: >60 mL/min (ref >60)
GFR calc Af Amer: >60 mL/min (ref >60)
GFR calc Af Amer: >60 mL/min (ref >60)
GFR calc Af Amer: >60 mL/min (ref >60)
GFR calc non Af Amer: >60 mL/min (ref >60)
GFR calc non Af Amer: >60 mL/min (ref >60)
GFR calc non Af Amer: >60 mL/min (ref >60)
GFR calc non Af Amer: >60 mL/min (ref >60)
GFR calc non Af Amer: >60 mL/min (ref >60)
Glucose, Bld: 103 mg/dL — ABNORMAL HIGH (ref 70–99)
Glucose, Bld: 109 mg/dL — ABNORMAL HIGH (ref 70–99)
Glucose, Bld: 76 mg/dL (ref 70–99)
Glucose, Bld: 83 mg/dL (ref 70–99)
Glucose, Bld: 90 mg/dL (ref 70–99)
Potassium: 3.4 mEq/L — ABNORMAL LOW (ref 3.5–5.1)
Potassium: 3.6 mEq/L (ref 3.5–5.1)
Potassium: 3.6 mEq/L (ref 3.5–5.1)
Potassium: 4 mEq/L (ref 3.5–5.1)
Potassium: 4.1 mEq/L (ref 3.5–5.1)
Sodium: 132 mEq/L — ABNORMAL LOW (ref 135–145)
Sodium: 133 mEq/L — ABNORMAL LOW (ref 135–145)
Sodium: 137 mEq/L (ref 135–145)
Sodium: 137 mEq/L (ref 135–145)
Sodium: 137 mEq/L (ref 135–145)

## 2010-03-22 LAB — ALBUMIN
Albumin: 2.7 g/dL — ABNORMAL LOW (ref 3.5–5.2)
Albumin: 2.8 g/dL — ABNORMAL LOW (ref 3.5–5.2)

## 2010-03-22 LAB — PHOSPHORUS
Phosphorus: 2.5 mg/dL (ref 2.3–4.6)
Phosphorus: 2.8 mg/dL (ref 2.3–4.6)

## 2010-03-22 LAB — LIPASE, BLOOD: Lipase: 21 U/L (ref 11–59)

## 2010-03-22 LAB — MAGNESIUM
Magnesium: 1.6 mg/dL (ref 1.5–2.5)
Magnesium: 1.6 mg/dL (ref 1.5–2.5)

## 2010-03-23 LAB — CBC
HCT: 38.9 % (ref 36.0–46.0)
Hemoglobin: 13.5 g/dL (ref 12.0–15.0)
MCHC: 34.7 g/dL (ref 30.0–36.0)
MCV: 90.9 fL (ref 78.0–100.0)
Platelets: 227 10*3/uL (ref 150–400)
RBC: 4.28 MIL/uL (ref 3.87–5.11)
RDW: 13.3 % (ref 11.5–15.5)
WBC: 7.4 10*3/uL (ref 4.0–10.5)

## 2010-03-23 LAB — DIFFERENTIAL
Basophils Absolute: 0 10*3/uL (ref 0.0–0.1)
Basophils Relative: 1 % (ref 0–1)
Eosinophils Absolute: 0.1 10*3/uL (ref 0.0–0.7)
Eosinophils Relative: 2 % (ref 0–5)
Lymphocytes Relative: 28 % (ref 12–46)
Lymphs Abs: 2 10*3/uL (ref 0.7–4.0)
Monocytes Absolute: 0.6 10*3/uL (ref 0.1–1.0)
Monocytes Relative: 8 % (ref 3–12)
Neutro Abs: 4.6 10*3/uL (ref 1.7–7.7)
Neutrophils Relative %: 62 % (ref 43–77)

## 2010-03-23 LAB — BASIC METABOLIC PANEL
BUN: 11 mg/dL (ref 6–23)
CO2: 27 mEq/L (ref 19–32)
Calcium: 9.4 mg/dL (ref 8.4–10.5)
Chloride: 105 mEq/L (ref 96–112)
Creatinine, Ser: 0.79 mg/dL (ref 0.4–1.2)
GFR calc Af Amer: >60 mL/min (ref >60)
GFR calc non Af Amer: >60 mL/min (ref >60)
Glucose, Bld: 93 mg/dL (ref 70–99)
Potassium: 3.7 mEq/L (ref 3.5–5.1)
Sodium: 140 mEq/L (ref 135–145)

## 2010-03-23 LAB — POCT CARDIAC MARKERS
CKMB, poc: <1 ng/mL — ABNORMAL LOW (ref 1.0–8.0)
Myoglobin, poc: 25.4 ng/mL (ref 12–200)
Troponin i, poc: <0.05 ng/mL (ref 0.00–0.09)

## 2010-03-23 LAB — D-DIMER, QUANTITATIVE: D-Dimer, Quant: 0.35 ug/mL-FEU (ref 0.00–0.48)

## 2010-04-06 LAB — CROSSMATCH
ABO/RH(D): O POS
Antibody Screen: NEGATIVE

## 2010-04-06 LAB — CBC
HCT: 43.5 % (ref 36.0–46.0)
Hemoglobin: 14.9 g/dL (ref 12.0–15.0)
MCHC: 34.2 g/dL (ref 30.0–36.0)
MCV: 92 fL (ref 78.0–100.0)
Platelets: 267 10*3/uL (ref 150–400)
RBC: 4.72 MIL/uL (ref 3.87–5.11)
RDW: 12.6 % (ref 11.5–15.5)
WBC: 6.8 10*3/uL (ref 4.0–10.5)

## 2010-04-06 LAB — BASIC METABOLIC PANEL
BUN: 11 mg/dL (ref 6–23)
CO2: 30 mEq/L (ref 19–32)
Calcium: 9.9 mg/dL (ref 8.4–10.5)
Chloride: 99 mEq/L (ref 96–112)
Creatinine, Ser: 0.65 mg/dL (ref 0.4–1.2)
GFR calc Af Amer: >60 mL/min (ref >60)
GFR calc non Af Amer: >60 mL/min (ref >60)
Glucose, Bld: 81 mg/dL (ref 70–99)
Potassium: 4.7 mEq/L (ref 3.5–5.1)
Sodium: 138 mEq/L (ref 135–145)

## 2010-04-06 LAB — ABO/RH: ABO/RH(D): O POS

## 2010-04-07 LAB — CBC
HCT: 36.4 % (ref 36.0–46.0)
Hemoglobin: 12.6 g/dL (ref 12.0–15.0)
MCHC: 34.4 g/dL (ref 30.0–36.0)
MCV: 91.6 fL (ref 78.0–100.0)
Platelets: 201 10*3/uL (ref 150–400)
RBC: 3.98 MIL/uL (ref 3.87–5.11)
RDW: 12.5 % (ref 11.5–15.5)
WBC: 5.4 10*3/uL (ref 4.0–10.5)

## 2010-04-07 LAB — COMPREHENSIVE METABOLIC PANEL
ALT: 10 U/L (ref 0–35)
AST: 11 U/L (ref 0–37)
Albumin: 3.7 g/dL (ref 3.5–5.2)
Alkaline Phosphatase: 47 U/L (ref 39–117)
BUN: 13 mg/dL (ref 6–23)
CO2: 27 mEq/L (ref 19–32)
Calcium: 8.8 mg/dL (ref 8.4–10.5)
Chloride: 106 mEq/L (ref 96–112)
Creatinine, Ser: 0.7 mg/dL (ref 0.4–1.2)
GFR calc Af Amer: >60 mL/min (ref >60)
GFR calc non Af Amer: >60 mL/min (ref >60)
Glucose, Bld: 89 mg/dL (ref 70–99)
Potassium: 4 mEq/L (ref 3.5–5.1)
Sodium: 137 mEq/L (ref 135–145)
Total Bilirubin: 0.4 mg/dL (ref 0.3–1.2)
Total Protein: 6.5 g/dL (ref 6.0–8.3)

## 2010-04-07 LAB — DIFFERENTIAL
Basophils Absolute: 0 10*3/uL (ref 0.0–0.1)
Basophils Relative: 0 % (ref 0–1)
Eosinophils Absolute: 0.5 10*3/uL (ref 0.0–0.7)
Eosinophils Relative: 8 % — ABNORMAL HIGH (ref 0–5)
Lymphocytes Relative: 26 % (ref 12–46)
Lymphs Abs: 1.4 10*3/uL (ref 0.7–4.0)
Monocytes Absolute: 0.5 10*3/uL (ref 0.1–1.0)
Monocytes Relative: 9 % (ref 3–12)
Neutro Abs: 3.1 10*3/uL (ref 1.7–7.7)
Neutrophils Relative %: 57 % (ref 43–77)

## 2010-04-09 LAB — URINALYSIS, ROUTINE W REFLEX MICROSCOPIC
Bilirubin Urine: NEGATIVE
Glucose, UA: NEGATIVE mg/dL
Ketones, ur: NEGATIVE mg/dL
Leukocytes, UA: NEGATIVE
Nitrite: NEGATIVE
Protein, ur: NEGATIVE mg/dL
Specific Gravity, Urine: 1.01 (ref 1.005–1.030)
Urobilinogen, UA: 0.2 mg/dL (ref 0.0–1.0)
pH: 7.5 (ref 5.0–8.0)

## 2010-04-09 LAB — DIFFERENTIAL
Basophils Absolute: 0 10*3/uL (ref 0.0–0.1)
Basophils Relative: 0 % (ref 0–1)
Eosinophils Absolute: 0.3 10*3/uL (ref 0.0–0.7)
Eosinophils Relative: 2 % (ref 0–5)
Lymphocytes Relative: 13 % (ref 12–46)
Lymphs Abs: 1.5 10*3/uL (ref 0.7–4.0)
Monocytes Absolute: 0.9 10*3/uL (ref 0.1–1.0)
Monocytes Relative: 8 % (ref 3–12)
Neutro Abs: 9.1 10*3/uL — ABNORMAL HIGH (ref 1.7–7.7)
Neutrophils Relative %: 77 % (ref 43–77)

## 2010-04-09 LAB — CBC
HCT: 36.8 % (ref 36.0–46.0)
Hemoglobin: 12.8 g/dL (ref 12.0–15.0)
MCHC: 34.8 g/dL (ref 30.0–36.0)
MCV: 92.2 fL (ref 78.0–100.0)
Platelets: 240 10*3/uL (ref 150–400)
RBC: 3.99 MIL/uL (ref 3.87–5.11)
RDW: 12.6 % (ref 11.5–15.5)
WBC: 11.8 10*3/uL — ABNORMAL HIGH (ref 4.0–10.5)

## 2010-04-09 LAB — COMPREHENSIVE METABOLIC PANEL
ALT: 11 U/L (ref 0–35)
AST: 12 U/L (ref 0–37)
Albumin: 3.9 g/dL (ref 3.5–5.2)
Alkaline Phosphatase: 55 U/L (ref 39–117)
BUN: 8 mg/dL (ref 6–23)
CO2: 28 mEq/L (ref 19–32)
Calcium: 8.8 mg/dL (ref 8.4–10.5)
Chloride: 102 mEq/L (ref 96–112)
Creatinine, Ser: 0.61 mg/dL (ref 0.4–1.2)
GFR calc Af Amer: >60 mL/min (ref >60)
GFR calc non Af Amer: >60 mL/min (ref >60)
Glucose, Bld: 84 mg/dL (ref 70–99)
Potassium: 3.5 mEq/L (ref 3.5–5.1)
Sodium: 137 mEq/L (ref 135–145)
Total Bilirubin: 1 mg/dL (ref 0.3–1.2)
Total Protein: 6.9 g/dL (ref 6.0–8.3)

## 2010-04-09 LAB — LIPASE, BLOOD: Lipase: 35 U/L (ref 11–59)

## 2010-04-09 LAB — URINE MICROSCOPIC-ADD ON

## 2010-04-09 LAB — PREGNANCY, URINE: Preg Test, Ur: NEGATIVE

## 2010-04-14 LAB — BASIC METABOLIC PANEL
BUN: 1 mg/dL — ABNORMAL LOW (ref 6–23)
BUN: <1 mg/dL — ABNORMAL LOW (ref 6–23)
CO2: 25 mEq/L (ref 19–32)
CO2: 26 mEq/L (ref 19–32)
Calcium: 7.4 mg/dL — ABNORMAL LOW (ref 8.4–10.5)
Calcium: 8.2 mg/dL — ABNORMAL LOW (ref 8.4–10.5)
Chloride: 108 mEq/L (ref 96–112)
Chloride: 110 mEq/L (ref 96–112)
Creatinine, Ser: 0.52 mg/dL (ref 0.4–1.2)
Creatinine, Ser: 0.55 mg/dL (ref 0.4–1.2)
GFR calc Af Amer: >60 mL/min (ref >60)
GFR calc Af Amer: >60 mL/min (ref >60)
GFR calc non Af Amer: >60 mL/min (ref >60)
GFR calc non Af Amer: >60 mL/min (ref >60)
Glucose, Bld: 103 mg/dL — ABNORMAL HIGH (ref 70–99)
Glucose, Bld: 104 mg/dL — ABNORMAL HIGH (ref 70–99)
Potassium: 2.9 mEq/L — ABNORMAL LOW (ref 3.5–5.1)
Potassium: 3.5 mEq/L (ref 3.5–5.1)
Sodium: 137 mEq/L (ref 135–145)
Sodium: 139 mEq/L (ref 135–145)

## 2010-04-14 LAB — DIFFERENTIAL
Basophils Absolute: 0 10*3/uL (ref 0.0–0.1)
Basophils Absolute: 0 10*3/uL (ref 0.0–0.1)
Basophils Absolute: 0 10*3/uL (ref 0.0–0.1)
Basophils Relative: 1 % (ref 0–1)
Basophils Relative: 1 % (ref 0–1)
Basophils Relative: 1 % (ref 0–1)
Eosinophils Absolute: 0.4 10*3/uL (ref 0.0–0.7)
Eosinophils Absolute: 0.4 10*3/uL (ref 0.0–0.7)
Eosinophils Absolute: 0.6 10*3/uL (ref 0.0–0.7)
Eosinophils Relative: 7 % — ABNORMAL HIGH (ref 0–5)
Eosinophils Relative: 7 % — ABNORMAL HIGH (ref 0–5)
Eosinophils Relative: 9 % — ABNORMAL HIGH (ref 0–5)
Lymphocytes Relative: 16 % (ref 12–46)
Lymphocytes Relative: 16 % (ref 12–46)
Lymphocytes Relative: 21 % (ref 12–46)
Lymphs Abs: 0.9 10*3/uL (ref 0.7–4.0)
Lymphs Abs: 1 10*3/uL (ref 0.7–4.0)
Lymphs Abs: 1.3 10*3/uL (ref 0.7–4.0)
Monocytes Absolute: 0.5 10*3/uL (ref 0.1–1.0)
Monocytes Absolute: 0.6 10*3/uL (ref 0.1–1.0)
Monocytes Absolute: 0.7 10*3/uL (ref 0.1–1.0)
Monocytes Relative: 11 % (ref 3–12)
Monocytes Relative: 11 % (ref 3–12)
Monocytes Relative: 8 % (ref 3–12)
Neutro Abs: 3.7 10*3/uL (ref 1.7–7.7)
Neutro Abs: 3.8 10*3/uL (ref 1.7–7.7)
Neutro Abs: 4.1 10*3/uL (ref 1.7–7.7)
Neutrophils Relative %: 63 % (ref 43–77)
Neutrophils Relative %: 64 % (ref 43–77)
Neutrophils Relative %: 66 % (ref 43–77)

## 2010-04-14 LAB — CBC
HCT: 29.2 % — ABNORMAL LOW (ref 36.0–46.0)
HCT: 29.5 % — ABNORMAL LOW (ref 36.0–46.0)
HCT: 29.6 % — ABNORMAL LOW (ref 36.0–46.0)
Hemoglobin: 10.1 g/dL — ABNORMAL LOW (ref 12.0–15.0)
Hemoglobin: 10.3 g/dL — ABNORMAL LOW (ref 12.0–15.0)
Hemoglobin: 10.3 g/dL — ABNORMAL LOW (ref 12.0–15.0)
MCHC: 34.7 g/dL (ref 30.0–36.0)
MCHC: 34.8 g/dL (ref 30.0–36.0)
MCHC: 34.9 g/dL (ref 30.0–36.0)
MCV: 89 fL (ref 78.0–100.0)
MCV: 89.2 fL (ref 78.0–100.0)
MCV: 89.8 fL (ref 78.0–100.0)
Platelets: 170 10*3/uL (ref 150–400)
Platelets: 187 10*3/uL (ref 150–400)
Platelets: 200 10*3/uL (ref 150–400)
RBC: 3.27 MIL/uL — ABNORMAL LOW (ref 3.87–5.11)
RBC: 3.29 MIL/uL — ABNORMAL LOW (ref 3.87–5.11)
RBC: 3.31 MIL/uL — ABNORMAL LOW (ref 3.87–5.11)
RDW: 12.7 % (ref 11.5–15.5)
RDW: 12.9 % (ref 11.5–15.5)
RDW: 13.1 % (ref 11.5–15.5)
WBC: 5.6 10*3/uL (ref 4.0–10.5)
WBC: 6.1 10*3/uL (ref 4.0–10.5)
WBC: 6.4 10*3/uL (ref 4.0–10.5)

## 2010-04-14 LAB — URINALYSIS, ROUTINE W REFLEX MICROSCOPIC
Bilirubin Urine: NEGATIVE
Glucose, UA: NEGATIVE mg/dL
Hgb urine dipstick: NEGATIVE
Ketones, ur: 40 mg/dL — AB
Nitrite: NEGATIVE
Protein, ur: NEGATIVE mg/dL
Specific Gravity, Urine: 1.015 (ref 1.005–1.030)
Urobilinogen, UA: 0.2 mg/dL (ref 0.0–1.0)
pH: 6 (ref 5.0–8.0)

## 2010-04-14 LAB — COMPREHENSIVE METABOLIC PANEL
ALT: 10 U/L (ref 0–35)
AST: 9 U/L (ref 0–37)
Albumin: 2.6 g/dL — ABNORMAL LOW (ref 3.5–5.2)
Alkaline Phosphatase: 48 U/L (ref 39–117)
BUN: 1 mg/dL — ABNORMAL LOW (ref 6–23)
CO2: 26 mEq/L (ref 19–32)
Calcium: 7.7 mg/dL — ABNORMAL LOW (ref 8.4–10.5)
Chloride: 108 mEq/L (ref 96–112)
Creatinine, Ser: 0.59 mg/dL (ref 0.4–1.2)
GFR calc Af Amer: >60 mL/min (ref >60)
GFR calc non Af Amer: >60 mL/min (ref >60)
Glucose, Bld: 90 mg/dL (ref 70–99)
Potassium: 3.5 mEq/L (ref 3.5–5.1)
Sodium: 138 mEq/L (ref 135–145)
Total Bilirubin: 0.5 mg/dL (ref 0.3–1.2)
Total Protein: 5.1 g/dL — ABNORMAL LOW (ref 6.0–8.3)

## 2010-04-14 LAB — FERRITIN: Ferritin: 89 ng/mL (ref 10–291)

## 2010-04-14 LAB — URINE MICROSCOPIC-ADD ON

## 2010-04-14 LAB — HEMOGLOBIN AND HEMATOCRIT, BLOOD
HCT: 30.9 % — ABNORMAL LOW (ref 36.0–46.0)
Hemoglobin: 10.8 g/dL — ABNORMAL LOW (ref 12.0–15.0)

## 2010-04-15 LAB — BASIC METABOLIC PANEL
BUN: 3 mg/dL — ABNORMAL LOW (ref 6–23)
CO2: 28 mEq/L (ref 19–32)
Calcium: 8 mg/dL — ABNORMAL LOW (ref 8.4–10.5)
Chloride: 107 mEq/L (ref 96–112)
Creatinine, Ser: 0.65 mg/dL (ref 0.4–1.2)
GFR calc Af Amer: >60 mL/min (ref >60)
GFR calc non Af Amer: >60 mL/min (ref >60)
Glucose, Bld: 84 mg/dL (ref 70–99)
Potassium: 3.7 mEq/L (ref 3.5–5.1)
Sodium: 138 mEq/L (ref 135–145)

## 2010-04-15 LAB — DIFFERENTIAL
Basophils Absolute: 0 10*3/uL (ref 0.0–0.1)
Basophils Absolute: 0 10*3/uL (ref 0.0–0.1)
Basophils Absolute: 0.1 10*3/uL (ref 0.0–0.1)
Basophils Relative: 0 % (ref 0–1)
Basophils Relative: 0 % (ref 0–1)
Basophils Relative: 1 % (ref 0–1)
Eosinophils Absolute: 0.1 10*3/uL (ref 0.0–0.7)
Eosinophils Absolute: 0.2 10*3/uL (ref 0.0–0.7)
Eosinophils Absolute: 0.3 10*3/uL (ref 0.0–0.7)
Eosinophils Relative: 1 % (ref 0–5)
Eosinophils Relative: 2 % (ref 0–5)
Eosinophils Relative: 3 % (ref 0–5)
Lymphocytes Relative: 10 % — ABNORMAL LOW (ref 12–46)
Lymphocytes Relative: 16 % (ref 12–46)
Lymphocytes Relative: 7 % — ABNORMAL LOW (ref 12–46)
Lymphs Abs: 1 10*3/uL (ref 0.7–4.0)
Lymphs Abs: 1.4 10*3/uL (ref 0.7–4.0)
Lymphs Abs: 1.8 10*3/uL (ref 0.7–4.0)
Monocytes Absolute: 1 10*3/uL (ref 0.1–1.0)
Monocytes Absolute: 1.2 10*3/uL — ABNORMAL HIGH (ref 0.1–1.0)
Monocytes Absolute: 1.4 10*3/uL — ABNORMAL HIGH (ref 0.1–1.0)
Monocytes Relative: 10 % (ref 3–12)
Monocytes Relative: 8 % (ref 3–12)
Monocytes Relative: 9 % (ref 3–12)
Neutro Abs: 11 10*3/uL — ABNORMAL HIGH (ref 1.7–7.7)
Neutro Abs: 12.3 10*3/uL — ABNORMAL HIGH (ref 1.7–7.7)
Neutro Abs: 8 10*3/uL — ABNORMAL HIGH (ref 1.7–7.7)
Neutrophils Relative %: 72 % (ref 43–77)
Neutrophils Relative %: 79 % — ABNORMAL HIGH (ref 43–77)
Neutrophils Relative %: 83 % — ABNORMAL HIGH (ref 43–77)

## 2010-04-15 LAB — CBC
HCT: 31.5 % — ABNORMAL LOW (ref 36.0–46.0)
HCT: 34.7 % — ABNORMAL LOW (ref 36.0–46.0)
HCT: 38.5 % (ref 36.0–46.0)
Hemoglobin: 10.8 g/dL — ABNORMAL LOW (ref 12.0–15.0)
Hemoglobin: 12.1 g/dL (ref 12.0–15.0)
Hemoglobin: 13.5 g/dL (ref 12.0–15.0)
MCHC: 34.2 g/dL (ref 30.0–36.0)
MCHC: 34.9 g/dL (ref 30.0–36.0)
MCHC: 35 g/dL (ref 30.0–36.0)
MCV: 89.7 fL (ref 78.0–100.0)
MCV: 90.4 fL (ref 78.0–100.0)
MCV: 90.7 fL (ref 78.0–100.0)
Platelets: 168 10*3/uL (ref 150–400)
Platelets: 180 10*3/uL (ref 150–400)
Platelets: 226 10*3/uL (ref 150–400)
RBC: 3.48 MIL/uL — ABNORMAL LOW (ref 3.87–5.11)
RBC: 3.84 MIL/uL — ABNORMAL LOW (ref 3.87–5.11)
RBC: 4.29 MIL/uL (ref 3.87–5.11)
RDW: 13 % (ref 11.5–15.5)
RDW: 13.1 % (ref 11.5–15.5)
RDW: 13.2 % (ref 11.5–15.5)
WBC: 11.1 10*3/uL — ABNORMAL HIGH (ref 4.0–10.5)
WBC: 14.1 10*3/uL — ABNORMAL HIGH (ref 4.0–10.5)
WBC: 14.8 10*3/uL — ABNORMAL HIGH (ref 4.0–10.5)

## 2010-04-15 LAB — COMPREHENSIVE METABOLIC PANEL
ALT: 13 U/L (ref 0–35)
ALT: 16 U/L (ref 0–35)
AST: 12 U/L (ref 0–37)
AST: 14 U/L (ref 0–37)
Albumin: 3.2 g/dL — ABNORMAL LOW (ref 3.5–5.2)
Albumin: 3.8 g/dL (ref 3.5–5.2)
Alkaline Phosphatase: 53 U/L (ref 39–117)
Alkaline Phosphatase: 59 U/L (ref 39–117)
BUN: 6 mg/dL (ref 6–23)
BUN: 9 mg/dL (ref 6–23)
CO2: 28 mEq/L (ref 19–32)
CO2: 28 mEq/L (ref 19–32)
Calcium: 8.2 mg/dL — ABNORMAL LOW (ref 8.4–10.5)
Calcium: 8.6 mg/dL (ref 8.4–10.5)
Chloride: 100 mEq/L (ref 96–112)
Chloride: 104 mEq/L (ref 96–112)
Creatinine, Ser: 0.64 mg/dL (ref 0.4–1.2)
Creatinine, Ser: 0.73 mg/dL (ref 0.4–1.2)
GFR calc Af Amer: >60 mL/min (ref >60)
GFR calc Af Amer: >60 mL/min (ref >60)
GFR calc non Af Amer: >60 mL/min (ref >60)
GFR calc non Af Amer: >60 mL/min (ref >60)
Glucose, Bld: 90 mg/dL (ref 70–99)
Glucose, Bld: 99 mg/dL (ref 70–99)
Potassium: 3.9 mEq/L (ref 3.5–5.1)
Potassium: 3.9 mEq/L (ref 3.5–5.1)
Sodium: 136 mEq/L (ref 135–145)
Sodium: 136 mEq/L (ref 135–145)
Total Bilirubin: 1 mg/dL (ref 0.3–1.2)
Total Bilirubin: 1.1 mg/dL (ref 0.3–1.2)
Total Protein: 6.1 g/dL (ref 6.0–8.3)
Total Protein: 6.6 g/dL (ref 6.0–8.3)

## 2010-04-15 LAB — URINE MICROSCOPIC-ADD ON

## 2010-04-15 LAB — URINALYSIS, ROUTINE W REFLEX MICROSCOPIC
Bilirubin Urine: NEGATIVE
Glucose, UA: NEGATIVE mg/dL
Ketones, ur: NEGATIVE mg/dL
Leukocytes, UA: NEGATIVE
Nitrite: NEGATIVE
Protein, ur: NEGATIVE mg/dL
Specific Gravity, Urine: 1.02 (ref 1.005–1.030)
Urobilinogen, UA: 0.2 mg/dL (ref 0.0–1.0)
pH: 7 (ref 5.0–8.0)

## 2010-04-15 LAB — PROTIME-INR
INR: 1.1 (ref 0.00–1.49)
Prothrombin Time: 14.7 seconds (ref 11.6–15.2)

## 2010-04-15 LAB — MAGNESIUM: Magnesium: 1.8 mg/dL (ref 1.5–2.5)

## 2010-04-15 LAB — PHOSPHORUS: Phosphorus: 3.1 mg/dL (ref 2.3–4.6)

## 2010-04-15 LAB — PREGNANCY, URINE: Preg Test, Ur: NEGATIVE

## 2010-04-15 LAB — URINE CULTURE
Colony Count: NO GROWTH
Culture: NO GROWTH
Special Requests: NEGATIVE

## 2010-04-15 LAB — APTT: aPTT: 44 seconds — ABNORMAL HIGH (ref 24–37)

## 2010-04-15 LAB — LIPASE, BLOOD: Lipase: 15 U/L (ref 11–59)

## 2010-05-19 NOTE — H&P (Signed)
NAME:  Ruth Duncan, Ruth Duncan NO.:  [id number]   MEDICAL RECORD NO.:  [id number]          PATIENT TYPE:  INP   LOCATION:  A308                          FACILITY:  [facility]   PHYSICIAN:  Osvaldo Shipper, MD     DATE OF BIRTH:  10-Jan-1970   DATE OF ADMISSION:  05/01/2008  DATE OF DISCHARGE:  LH                              HISTORY & PHYSICAL   The patient goes to the [facility] as needed.  She goes to Dr. Evelene Croon in [facility] for psychiatric care.   ADMITTING DIAGNOSES:  1. Extensive diverticulitis.  2. History of bipolar disorder, stable.   CHIEF COMPLAINT:  Abdominal pain for the past 5 days.   HISTORY OF PRESENT ILLNESS:  The patient is a 40 year old lady of [place]  descent who was in her usual state of health until this past Friday when  she started developing abdominal pain.  The pain started insidiously, no  precipitant factors are identified, no aggravating or relieving factors  identified.  The patient was initially 6/10.  It slowly started getting  worse over the past 5 days and last night became 10/10 in intensity.  The pain is described as squeezing pain located all over the abdomen.  No definite radiation of the pain was identified.  The pain has been  progressively getting worse.  The pain has been associated with nausea  and vomiting enumerable times with the last episode being about 2 hours  ago in the ED.  She denies any diarrhea.  Denies any blood in the stool.  Denies any constipation or having to strain with having a bowel  movement.  She denies any blood in the emesis.  She did have a fever up  to 101 degrees Fahrenheit yesterday night.   MEDICATIONS AT HOME:  She is on multiple medications for bipolar.  She  is not sure of all the medications, but she thinks she is on Seroquel,  Lamictal, Abilify, Xanax and Klonopin - the exact doses are unknown.  Her husband is going to bring these medications in tomorrow.   ALLERGIES:  NO KNOWN  DRUG ALLERGIES.   PAST MEDICAL HISTORY:  Remarkable for bipolar disorder.   PAST SURGICAL HISTORY:  1. C-section.  2. Hysterectomy.  3. Appendectomy.   SOCIAL HISTORY:  Lives in [place] with her husband.  Currently  unemployed.  Smokes 1/2 pack of cigarettes on a daily basis.  Counseling  was provided.  No alcohol use, no illicit drug use.  Independent with  her daily activities.   FAMILY HISTORY:  Positive for hypertension.   REVIEW OF SYSTEMS:  GENERAL:  Positive for weakness and malaise.  HEENT:  Unremarkable.  CARDIOVASCULAR:  Unremarkable.  RESPIRATORY:  Unremarkable.  GI:  As in HPI.  GU:  Unremarkable.  NEUROLOGIC:  Unremarkable.  PSYCHIATRIC:  Unremarkable.  DERMATOLOGIC:  Unremarkable.   Other systems unremarkable.   PHYSICAL EXAMINATION:  VITAL SIGNS:  In the ED, temperature was 98.7,  blood pressure 100/57, heart rate 88, respiratory rate 16.  O2  saturation 96% on room air.  GENERAL:  This  is a thin female of [place] descent in no distress.  HEENT:  There is no pallor.  No icterus.  Oral mucous membranes moist.  No oral lesions are noted.  NECK:  Soft and supple.  No thyromegaly is appreciated.  No  lymphadenopathy is noted in the cervical area.  LUNGS:  Clear to auscultation bilaterally.  No wheezing, rales or  rhonchi.  No dullness to percussion.  CARDIOVASCULAR:  S1-S2 is normal, regular.  No murmurs appreciated.  No  S3-S4, no rubs, no bruits.  PMI was nondisplaced.  ABDOMEN:  Soft, diffusely tender, some rebound is present.  No rigidity  is noted.  Bowel sounds are sluggish but present.  No masses or  organomegaly appreciated.  RECTAL:  Not done.  GU:  Deferred.  DERMATOLOGIC:  Revealed no skin rashes.  MUSCULOSKELETAL:  Unremarkable.  NEUROLOGIC:  She is alert and oriented x3.  No focal neurological  deficits are present.   LABORATORY DATA:  White count is 14,800 with 83% neutrophils.  Hemoglobin is normal.  Platelet count is normal.  Electrolytes  are  normal.  Renal function is normal.  LFTs are normal.  Lipase is 15.  Beta HCG was negative.  UA showed small blood, a few squamous epithelial  cells.   IMAGING STUDIES:  She had a CT of the abdomen and pelvis which showed  evidence for extensive diverticulitis, especially in the hepatic flexure  area, subsequent focal colitis also noted.  No abscess was seen.  No  hydronephrosis, no hydroureter.  Otherwise unremarkable CT.   ASSESSMENT:  This is a 40 year old female with bipolar disorder who  presents with abdominal pain and is found to have evidence for acute  diverticulitis.   PLAN:  1. Acute diverticulitis.  Treat with IV Cipro and Flagyl.  Give      narcotics for pain control.  Give symptomatic treatment for nausea      and vomiting.  I have emphasized avoiding constipation, increasing      the roughage in the diet.  She will require outpatient GI      evaluation when she is stable and okay in the next few weeks.  2. Bipolar disorder.  She is on multiple medications for this.  The      husband is going to bring in the medication list tomorrow and we      can then start her on these medications.  3. Deep venous thrombosis prophylaxis with Lovenox.  4. IV fluids will be given.  Her blood pressure is low, and she tells      me that she always has low blood pressures, but we will anyway keep      this closely monitored.  5. Further management decisions will depend on results of further      testing and patient's response to treatment.      Osvaldo Shipper, MD  Electronically Signed     GK/MEDQ  D:  05/01/2008  T:  05/01/2008  Job:  [id number]   cc:   [facility]

## 2010-05-19 NOTE — Group Therapy Note (Signed)
NAME:  Ruth Duncan, Ruth Duncan NO.:  [id number]   MEDICAL RECORD NO.:  [id number]          PATIENT TYPE:  INP   LOCATION:  A308                          FACILITY:  [facility]   PHYSICIAN:  Dorris Singh, DO    DATE OF BIRTH:  05/09/1970   DATE OF PROCEDURE:  05/06/2008  DATE OF DISCHARGE:                                 PROGRESS NOTE   The patient was seen today and states she feels a lot better.  She was  able to eat without any problems.  She is scheduled for an EGD today.  Temperature is 96.9, pulse 60, respirations 18, blood pressure 108/65.  Generally, she is clinically improved from previous examinations and she  is in no acute distress.  HEART:  Regular rate and rhythm.  LUNGS:  Clear to auscultation bilaterally.  ABDOMEN:  Soft, nontender,  nondistended.  EXTREMITIES:  Positive pulses.  Her labs:  White count is  6.1, hemoglobin 10.3, hematocrit 29.5, and her platelet count 200,000.  Her BMET is within normal limits with her glucose being slightly  elevated at 104.   ASSESSMENT AND PLAN:  1. Acute diverticulitis.  The patient seems to be clinically      improving.  We will continue with IV antibiotics.  She is eating      now and we will continue to monitor.  2. Schedule for an EGD today.  I will await any findings that GI may      have regarding her EGD.  If they feel that she is able to be      discharged, we will anticipate discharge today if not first thing      in the morning.  We will continue to monitor her and change therapy      as necessary.  3. Anemia.  This could be hemodilution effect.  She has been on      several boluses of fluids and we will continue to monitor this.      Dorris Singh, DO  Electronically Signed     CB/MEDQ  D:  05/06/2008  T:  05/06/2008  Job:  [id number]

## 2010-05-19 NOTE — Op Note (Signed)
NAME:  Ruth Duncan, Ruth Duncan                 ACCOUNT NO.:  [id number]   MEDICAL RECORD NO.:  [id number]          PATIENT TYPE:  INP   LOCATION:  A308                          FACILITY:  [facility]   PHYSICIAN:  Kassie Mends, M.D.      DATE OF BIRTH:  Jul 15, 1970   DATE OF PROCEDURE:  05/06/2008  DATE OF DISCHARGE:                               OPERATIVE REPORT   PRIMARY GASTROENTEROLOGIST:  Roetta Sessions.   PROCEDURE:  Esophagogastroduodenoscopy.   INDICATIONS FOR EXAM:  Ms. Birdsall is 40 year old female who presented  with abdominal pain and diverticulitis.  She was placed on Lovenox.  Her  hemoglobin dropped from 12 to 13 to 10.  In reviewing the labs, in  November 2008, she had a hemoglobin of 12.8.  She has had a  hysterectomy.  She developed vomiting during her hospital stay and had  dark contents, as well as rare bright red blood.   FINDINGS:  1. Normal esophagus without evidence of Barrett's, mass, erosion,      ulceration or stricture.  2. Hiatal hernia.  Otherwise no evidence of erosions, ulcerations or      erythema in the stomach.  3. Normal duodenal bulb and second portion of the duodenum.   DIAGNOSIS:  No source for coffee-ground emesis and hematemesis  identified.  No source for acute drop in her hemoglobin identified.  She  never had bright red blood per rectum.   RECOMMENDATIONS:  1. Continue low residue diet for the next 5 days.  She is given a      handout on discharge.  2. Continue antibiotics for 10 days.  She will complete Augmentin 500      mg b.i.d. for 5 days as an outpatient.  3. She is given a prescription for Zofran and Dilaudid to use as      needed.  The Zofran tablets, quantity number 20 and Dilaudid      number 15.  4. Outpatient visit with Dr. Jena Gauss in 1 month.  5. Will check ferritin level today.  If she is iron deficient, would      consider capsule endoscopy and/or colonoscopy.   MEDICATIONS:  1. Demerol 1 mg IV.  2. Versed 4 mg IV.   PROCEDURE  TECHNIQUE:  Physical exam was performed.  Informed consent was  obtained from the patient after explaining the benefits, risks, and  alternatives to the procedure.  The patient was connected to the monitor  and placed in the left lateral position.  Continuous oxygen was provided  by nasal cannula and IV medicine administered through an indwelling  cannula.  After administration of sedation, the patient's esophagus was  intubated and the scope advanced under direct visualization to the  second portion of the duodenum.  The scope was removed slowly  by careful exam of the color, texture, anatomy, and integrity of the way  out.  The patient was recovered in endoscopy and discharged home in  satisfactory condition.   LABS:  Ferritin 89      Kassie Mends, M.D.  Electronically Signed     SM/MEDQ  D:  05/06/2008  T:  05/06/2008  Job:  [id number]   cc:   Ernestina Penna, M.D.  Fax: [phone]

## 2010-05-19 NOTE — Group Therapy Note (Signed)
NAME:  Ruth Duncan, HERBEL NO.:  [id number]   MEDICAL RECORD NO.:  [id number]          PATIENT TYPE:  INP   LOCATION:  A308                          FACILITY:  [facility]   PHYSICIAN:  Dorris Singh, DO    DATE OF BIRTH:  [birth year]   DATE OF PROCEDURE:  DATE OF DISCHARGE:                                 PROGRESS NOTE   The patient has been still complaining of abdominal pain.  We will  increase her IV fluids.  GI seen her and agreed with the current  management.  We will allow her to have ice chips as well.  She was  actually seen in [place] back in 2008.  We are requesting records.   PHYSICAL EXAMINATION:  VITAL SIGNS:  Temperature 97.2, pulse 78,  respirations 18, and blood pressure 93/54.  GENERAL:  A plus O x3.  HEART:  Regular rate and rhythm.  LUNGS:  Clear to auscultation bilaterally.  ABDOMEN:  Positive without tenderness in the right lower quadrant.  EXTREMITIES:  Positive pulses.   LABORATORY DATA:  As follows; white count is 14.1, hemoglobin 12.1,  hematocrit 34.7, and platelet count is 180.  Her BMP is within normal  limits.   ASSESSMENT AND PLAN:  Acute diverticulitis.  We will continue with  n.p.o. and IV antibiotics and GI is on the case.  I will make sure the  patient starts her home medications and we will continue to monitor and  she is __________.      Dorris Singh, DO  Electronically Signed     CB/MEDQ  D:  05/02/2008  T:  05/03/2008  Job:  [id number]

## 2010-05-19 NOTE — Discharge Summary (Signed)
NAME:  Ruth Duncan, Ruth Duncan                 ACCOUNT NO.:  [id number]   MEDICAL RECORD NO.:  [id number]          PATIENT TYPE:  INP   LOCATION:  A308                          FACILITY:  [facility]   PHYSICIAN:  Dorris Singh, DO    DATE OF BIRTH:  1970/07/04   DATE OF ADMISSION:  05/01/2008  DATE OF DISCHARGE:  05/03/2010LH                               DISCHARGE SUMMARY   ADMISSION DIAGNOSES:  Include:  1. Acute diverticulitis.  2. History of bipolar disorder.   DISCHARGE DIAGNOSES:  1. Acute diverticulitis, resolved.  2. Bipolar disorder.  3. Tobacco abuse.   CONSULTS:  Include Dr. Roetta Sessions of [facility].   TESTING AND OPERATIVE REPORTS:  She had an EGD on May 06, 2008, which  demonstrated normal esophagus without evidence of Barrett's, mass,  erosion, or ulceration or stricture.  She had hiatal hernia and a normal  duodenal bulb.   RADIOLOGY TESTS:  Include on May 01, 2008, a CT of the abdomen and  pelvis which demonstrated inflammatory changes in the right upper  abdomen, right hepatic flexure, colonic region, multiple colonic  diverticula noted in this region.  Findings are consistent with  extensive diverticulitis.  There is subsequent focal colitis.  There was  a strand of pericolonic fat and pericolonic fluid is noted.  No definite  diverticular abscess.  No definite extraluminal contrast material.  No  hydronephrosis or hydroureter.  Her pelvis, surgically absent uterus, no  adnexal masses noted, no pelvic ascites or adenopathy.   HOSPITAL COURSE:  Patient was admitted to the service of [facility] with  the above diagnoses.  Her primary care physician is at [facility] and she goes to Dr. Sharl Ma in [facility] for  psychiatric care.  She was admitted to the service of [facility].  She  was placed on Cipro and Flagyl IV.  She was also made n.p.o. and GI was  consulted to see her.  For her bipolar medications, her husband was to  bring in her list and she was  followed on that until she could eat.  She  was also started on IV fluids.  She continued for several days to have  abdominal pain and could not eat.  She was having recurrent nausea and  vomiting and did have an episode of anxiety in which she was given  Ativan for.  On May 05, 2008, she continued to improve and she was  started on a liquid diet and she was able to eat a full diet on May 05, 2008, and then on May 06, 2008, she had an EGD done which was normal.  It  was determined after her EGD that she could be discharged to home and to  follow up with Dr. Cira Servant if that is recommended.   DISCHARGE INSTRUCTIONS:  1. Eat a low-residue diet.  2. Schedule with Dr. Jena Gauss in 1 month for her anemia and abdominal      pain.   CURRENT MEDICATIONS THAT SHE WILL BE PLACED ON:  Include:  1. Lamictal ODT 25 mg a week for week 1 and  2, 50 mg for week 3 and 4,      and 100 mg for week 5.  2. Alprazolam 1 mg as needed.  3. Seroquel 150 mg daily.   She will be sent home with prescriptions written by GI on:  1. Dilaudid 2 mg one p.o. every 4 hours, #15.  2. Augmentin 500 mg one p.o. b.i.d.  3. Zofran 4 mg one p.o. every 4 hours, #20.   DISCHARGE INSTRUCTIONS:  To follow up with [facility] in the  next 2 weeks and with Dr. Jena Gauss in 4 weeks.  She is to return if  symptoms worsen and she is to take medications as recommended.   CONDITION:  Stable.   DISPOSITION:  Will be to home.      Dorris Singh, DO  Electronically Signed     CB/MEDQ  D:  05/06/2008  T:  05/06/2008  Job:  [phone]

## 2010-05-19 NOTE — Consult Note (Signed)
NAME:  Ruth Duncan, Ruth Duncan                 ACCOUNT NO.:  [id number]   MEDICAL RECORD NO.:  [id number]          PATIENT TYPE:  INP   LOCATION:  A308                          FACILITY:  [facility]   PHYSICIAN:  R. Roetta Sessions, M.D. DATE OF BIRTH:  December 25, 1970   DATE OF CONSULTATION:  05/01/2008  DATE OF DISCHARGE:                                 CONSULTATION   REQUESTING PHYSICIAN:  [facility].   REASON FOR CONSULTATION:  Diverticulitis.   HISTORY OF PRESENT ILLNESS:  The patient is a very pleasant 40 year old  lady with [place] descent who presented to the emergency department  yesterday with progressive 6 day history of right-sided abdominal pain  associated with protracted nausea and vomiting.  She states the symptoms  began about last Friday.  She thought she was having gas like pains.  Over the course of several days the pain became more severe.  A couple  of days ago she had a temperature of 101.  She describes the pain as  squeezing like.  There is some radiation into her back at this point.  Her left abdomen is sore but not quite as severe as the right side.  She  states her bowel movements have been regular.  She denies any  constipation.  Denies any diarrhea.  She describes her stools as very  dark and black.  This has been occurring for a couple of weeks.  Denies  any Pepto-Bismol use.  She did have stool Hemoccult negative in the ED  yesterday.  She denies any frank blood in the stool.  No blood in the  emesis.  She tells me she takes Macrobid p.r.n. for frequent UTIs.  She  is also on multiple medications for bipolar disorder but denies any  NSAID or aspirin use.   HOME MEDICATIONS:  We are still waiting for a list of her medications,  but she believes she is on Seroquel, Lamictal, Abilify, Xanax and  Klonopin.   ALLERGIES:  No known drug allergies.   PAST MEDICAL HISTORY:  History of bipolar disorder with psychiatric  admission back in May of 2007.  History of frequent  UTIs.   PAST SURGICAL HISTORY:  She has had a C-section, hysterectomy,  appendectomy.  The patient states she has had another episode of  diverticulitis about 2 years and was hospitalized at [facility] but not as severe as this current episode.   FAMILY HISTORY:  Negative for chronic GI illnesses, colorectal cancer.   SOCIAL HISTORY:  She lives in [place] with her husband.  She has 3 sons.  She is currently unemployed.  She smokes 1/2 a pack of cigarettes daily.  No alcohol or drug use.   REVIEW OF SYSTEMS:  GI:  See HPI.  CONSTITUTIONAL:  Denies any weight  loss.  CARDIOPULMONARY:  Denies chest pain, shortness of breath,  palpitations or cough.  GENITOURINARY:  Denies any dysuria or hematuria.   PHYSICAL EXAMINATION:  VITAL SIGNS:  T current 99.4, pulse 80,  respirations 18, blood pressure 80/36.  O2 sat is 95% on room air.  Height  61 inches.  Weight 52.1 kilograms.  GENERAL:  A pleasant, well-nourished, well-developed young female in no  acute distress.  She does appear uncomfortable.  Her mother is at her  bedside.  SKIN:  Warm and dry, no jaundice.  HEENT:  Sclerae nonicteric.  Oropharyngeal mucosa moist and pink.  No  lymphadenopathy.  CHEST:  Lungs are clear to auscultation.  CARDIAC:  Reveals regular rate and rhythm.  No murmurs, rubs or gallops.  ABDOMEN:  Positive bowel sounds.  Abdomen soft.  Nondistended.  She has  moderate tenderness throughout the right abdomen, lesser tenderness in  the left abdomen.  Subjective guarding but no rebound.  No organomegaly  or masses appreciated but exam is somewhat limited by patient's  discomfort.  LOWER EXTREMITIES:  No edema.   LABS:  Sodium 136, potassium 3.9, BUN 6, creatinine 0.73, glucose 90,  total bilirubin 1.1, alkaline phosphatase 53, AST 12, ALT 13, albumin  3.2.  White count 14,100, hemoglobin 12.1, platelets 180,000, lipase 15.  CT showed inflammatory changes in the right upper quadrant/right hepatic   flexure colonic region.  Changes were consistent with extensive  diverticulitis and subsequent focal colitis.   IMPRESSION:  The patient is a very pleasant 40 year old lady with  progressive right-sided abdominal pain, nausea and vomiting likely due  to right-sided diverticula that is based on CT report.  She gives a  history of dark stool but I doubt melena, occurring for the past 2  weeks.  Her Hemoccult is negative.  Her H and H is normal.  I cannot  exclude the possibility of right colonic bleeding but at this point it  would appear to be minimal based on normal H and H.  She has never had a  prior colonoscopy and would recommend one at some point after complete  resolution of symptoms.  She states this is her second documented  episode of diverticulitis based on CT and requiring hospitalization in  the past 2 years.   RECOMMENDATIONS:  1. Will review the CT scan with radiologist.  2. Continue IV Cipro and IV Flagyl.  3. Maintain n.p.o. for now.  4. Further recommendations to follow.  5. Retrieve records from [facility] regarding CT in      2008.   I would like to thank the [facility] team for allowing [place] to take part  in the care of this patient.      Tana Coast, P.AJonathon Bellows, M.D.  Electronically Signed    LL/MEDQ  D:  05/02/2008  T:  05/02/2008  Job:  [id number]   cc:   [facility]   [facility]

## 2010-05-22 NOTE — H&P (Signed)
NAME:  Ruth Duncan, Ruth Duncan NO.:  [id number]   MEDICAL RECORD NO.:  [id number]          PATIENT TYPE:  IPS   LOCATION:  0307                          [facility]ACILITY:  [facility]   PHYSICIAN:  Jasmine Pang, M.D. DATE OF BIRTH:  21-Dec-1970   DATE OF ADMISSION:  05/21/2005  DATE OF DISCHARGE:                         PSYCHIATRIC ADMISSION ASSESSMENT   This is a voluntary admission to the services of Dr. Milford Cage.   IDENTIFYING INFORMATION:  This is a 40 year old, married, [place] female.  She states that she has been hearing voices in her head all of her life but  usually she is able to make it through.  Recently, she has begun crying.  She stayed home from work the past week.  She states the voices have been  telling her to hurt herself, but I didn't do it because of my kids.  The  voices are now telling her that, I am no good, nobody cares, I should just  end it and put everyone out of their misery.  One voice, I didn't  understand because it stumbled.  The patient states that her primary care  doctor, Dr. Morrie Sheldon, began Seroquel 50 mg about three days ago and the dose was  bumped to 100 mg last night.  She states today that she is not hearing the  voices.  She was also prescribed Xanax 0.25 mg t.i.d. approximately a month  ago.  She states that just takes it periodically.  Prior to this, she has  had no psychiatric care.  There is a reference that she tried to OD at age  65, and she also reports having a recent weight loss of approximately 21  pounds.  The patient is quite anxious today.  She is not actively crying,  although she is on the verge of tears and she states that she is a very  private person and only wants to discuss this further with the attending  psychiatrist.   PAST PSYCHIATRIC HISTORY:  She has no prior history.  She has an upcoming  appointment this Thursday, May 26, 2005, to start care at [facility].   SOCIAL HISTORY:  She has one year of college.   She works in an office.  She  is married once.  She has three children, all boys ages 10, and the twins  are almost 81.   FAMILY HISTORY:  She denies.   ALCOHOL AND DRUG HISTORY:  She does smoke one pack of cigarettes per day.   Her primary physician, Dr. Morrie Sheldon in [place] apparently recently told her that  she has COPD.   MEDICATIONS:  1.  As already stated, she is prescribed Seroquel now 100 mg at h.s.  2.  Xanax 0.25 t.i.d. p.r.n.   DRUG ALLERGIES:  No known drug allergies.   POSITIVE PHYSICAL FINDINGS:  She is a well-developed, well-nourished female  in no acute distress.   LABORATORY:  Within normal limits.  There was no __________  scan, etcetera  to indicate a weight loss.   MENTAL STATUS:  GENERAL:  She is alert and oriented x3.  She is casually  groomed.  Appropriately dressed.  MOTOR:  Within normal limits.  SPEECH:  Normal.  She had no bizarre references.  MOOD:  Close to tears but not crying.  AFFECT:  Reflects being anxious and scared.  THOUGHT PROCESSES:  Clear, organized, and goal-oriented.  She is requesting  discharge.  JUDGMENT/INSIGHT:  Intact.  CONCENTRATION/MEMORY:  Intact.  She is denying auditory/visual  hallucinations today and she is denying suicidal or homicidal ideation.   DIAGNOSES:   AXIS I:  1.  Major depressive disorder with psychotic features.  2.  Auditory hallucinations.   AXIS II:  No diagnosis.   AXIS III:  1.  Chronic obstructive pulmonary disease, does smoke one pack of cigarettes      per day.  2.  She is status post an appendectomy.  3.  She is status post two cesarean sections.  4.  She is also known to have a hiatal hernia.   AXIS IV:  Stress at work.   AXIS V:  Is 39.   PLAN:  1.  Admit for stabilization.  2.  Adjust her meds as indicated.  3.  To have her keep her upcoming appointment at [facility], May 26, 2005.      Mickie Leonarda Salon, P.A.-C.      Jasmine Pang, M.D.  Electronically  Signed    MD/MEDQ  D:  05/22/2005  T:  05/22/2005  Job:  [id number]

## 2010-05-22 NOTE — Discharge Summary (Signed)
NAME:  Ruth Duncan, Ruth Duncan                 ACCOUNT NO.:  [id number]   MEDICAL RECORD NO.:  [id number]          PATIENT TYPE:  IPS   LOCATION:  0307                          [facility]   PHYSICIAN:  Syed T. Arfeen, M.D.   DATE OF BIRTH:  05-04-1970   DATE OF ADMISSION:  05/21/2005  DATE OF DISCHARGE:                                 DISCHARGE SUMMARY   IDENTIFYING INFORMATION:  The patient is a 40 year old married [place] female  who reported that she has been hearing voices in her head all of her life  and currently she is not able to make it and cannot function.  The patient  was seen crying, very emotional and reported that she has been not going to  work for the past one week.  She reported that she could not handle the  voices.  They have started telling her to hurt herself.  However, the  patient reported any suicidal thoughts on the admission day.  The patient  supported that she had been very attached to her cats and she does not want  to do it for her children.  The patient feels sometimes that these voices  sometimes let her down.  However, she wanted to get better and make it good  for her life.  The patient was unable to give the nature of voices and  reported that she has been for such a long time that she could not remember  when she started.  Very recently, the patient was seen by primary care  doctor, Dr. Morrie Sheldon, who started her on Seroquel 50 mg three days ago and the  dose has been increased to 100 mg last night.  The patient also reported  that she has been taking Xanax 0.25 mg three times a day a month ago to help  her anxiety.  The patient endorses sometimes she feels very depressed,  overwhelmed with guilty and embarrassment that she cannot be a productive  person in the family.  She also reported she had lately been sleep problem  and not able to get sleep.  She also endorses sometimes crying, anhedonia,  though she denied any major significant weight loss.  She denies  any  homicidal thoughts at this time.   PAST PSYCHIATRIC HISTORY:  The patient reported that she had tried to  overdose at age 15 though no follow-up or psychiatric care.  The patient is  scheduled to see the psychiatrist at [facility] on Thursday, May 26, 2005.   MEDICAL HISTORY:  She denies any medical problems.   CURRENT MEDICATIONS:  She is on Seroquel 100 mg and Xanax 0.25 mg three  times a day.   LABORATORY DATA:  Within normal limits.   MENTAL STATUS EXAM:  The patient is alert and oriented x3.  She is casually  dressed, appropriately dressed and groomed.  Speech normal and coherent.  Thought processes logical, goal directed.  Affect tearful.  Mood sad,  depressed.  Thought processes clear, organized, goal directed.  Did not  endorse any suicidal thoughts, auditory hallucinations or paranoia at  this  time.  Attention and concentration distracted at times.  Insight, judgment  and impulse control fair.   ADMISSION DIAGNOSES:  AXIS I:  Major depressive disorder with psychotic  features.  Rule out bipolar disorder.  Rule out schizophrenia, paranoid-  type.  AXIS II:  Deferred.  AXIS III:  The patient has a history of appendectomy, C-sections.  AXIS IV:  Stress of job.  AXIS V:  40.   HOSPITAL COURSE:  The patient was admitted and placed on safety checks.  The  patient was restarted on her medication.  Seroquel 50 mg was started at  bedtime as she reported better with this medication.  She was also started  on 10 mg of Prozac to address her depressive symptoms.  A family session was  ordered which was done with the husband.  The patient was able to handle the  session very well.  Endorses that being away from her family, she has been  more stressed and overwhelmed and likely to be discharged.  The patient was  noted much calmer and relaxed.  She addresses that, if she stayed away from  the kids and the family, she would be more depressed.  However, she promised   that she will continue to follow up with her appointment with [facility].  She reported no side effects with the Prozac and wanted  to continue the Prozac to see if it can help her anxiety and depressive  thoughts.  She reported that, since she came to the hospital, she has not  heard any voices.  Denies any paranoia.  Denies any hallucinations, either  visual or auditory.  She reported that she felt ready to be discharged so  that she can continue her medications outside and agreed to comply with her  follow-up appointment.   CONDITION ON DISCHARGE:  Remarkably improved.  Mood euthymic.  Affect  better.  Denies auditory or visual hallucinations.  Denied any homicidal  thoughts or suicidal thoughts.  Reported no side effects from the  medications.   DISCHARGE MEDICATIONS:  1.  Prozac 10 mg daily.  2.  Xanax 0.25 mg three times a day.  3.  Seroquel 50 mg at bedtime.   She reported improved sleep with less anxiety and less depressive thoughts.   DISCHARGE DIAGNOSES:  AXIS I:  Depressive disorder with psychotic features.  AXIS II:  Deferred.  AXIS III:  History of appendectomy, two cesarean sections.  AXIS IV:  Stress of job.  AXIS V:  60.   FOLLOWUP:  The patient is schedule to see psychiatrist in [facility] on Thursday, May 26, 2005.  The patient agreed with the discharge  plan.      Syed T. Lolly Mustache, M.D.  Electronically Signed     STA/MEDQ  D:  05/23/2005  T:  05/23/2005  Job:  [id number]

## 2010-08-11 ENCOUNTER — Other Ambulatory Visit (HOSPITAL_COMMUNITY): Payer: Self-pay | Admitting: Orthopedic Surgery

## 2010-08-11 DIAGNOSIS — M542 Cervicalgia: Secondary | ICD-10-CM

## 2010-08-11 DIAGNOSIS — M545 Low back pain, unspecified: Secondary | ICD-10-CM

## 2010-08-15 ENCOUNTER — Ambulatory Visit (HOSPITAL_COMMUNITY)
Admission: RE | Admit: 2010-08-15 | Discharge: 2010-08-15 | Disposition: A | Discharge status: Home / Self Care | Payer: Medicare Other | Source: Ambulatory Visit | Attending: Orthopedic Surgery | Admitting: Orthopedic Surgery

## 2010-08-15 DIAGNOSIS — M542 Cervicalgia: Secondary | ICD-10-CM

## 2010-08-15 DIAGNOSIS — M545 Low back pain, unspecified: Secondary | ICD-10-CM | POA: Insufficient documentation

## 2010-08-15 DIAGNOSIS — J323 Chronic sphenoidal sinusitis: Secondary | ICD-10-CM | POA: Insufficient documentation

## 2010-08-15 DIAGNOSIS — M5030 Other cervical disc degeneration, unspecified cervical region: Secondary | ICD-10-CM | POA: Insufficient documentation

## 2010-09-02 ENCOUNTER — Ambulatory Visit (HOSPITAL_COMMUNITY)
Admission: RE | Admit: 2010-09-02 | Discharge: 2010-09-02 | Disposition: A | Discharge status: Home / Self Care | Payer: Medicare Other | Attending: Psychiatry | Admitting: Psychiatry

## 2010-09-02 DIAGNOSIS — F1420 Cocaine dependence, uncomplicated: Secondary | ICD-10-CM | POA: Insufficient documentation

## 2010-09-04 ENCOUNTER — Other Ambulatory Visit (HOSPITAL_COMMUNITY): Payer: Self-pay | Admitting: Physician Assistant

## 2010-09-04 ENCOUNTER — Other Ambulatory Visit (HOSPITAL_COMMUNITY): Payer: Medicare Other | Attending: Physician Assistant

## 2010-09-04 DIAGNOSIS — C189 Malignant neoplasm of colon, unspecified: Secondary | ICD-10-CM | POA: Insufficient documentation

## 2010-09-04 DIAGNOSIS — F1420 Cocaine dependence, uncomplicated: Secondary | ICD-10-CM | POA: Insufficient documentation

## 2010-09-05 LAB — URINE DRUGS OF ABUSE SCREEN W ALC, ROUTINE (REF LAB)
Amphetamine Screen, Ur: NEGATIVE
Barbiturate Quant, Ur: NEGATIVE
Benzodiazepines.: NEGATIVE
Cocaine Metabolites: NEGATIVE
Creatinine,U: 169.8 mg/dL
Ethyl Alcohol: <10 mg/dL (ref <10)
Marijuana Metabolite: NEGATIVE
Methadone: NEGATIVE
Opiate Screen, Urine: NEGATIVE
Phencyclidine (PCP): NEGATIVE
Propoxyphene: NEGATIVE

## 2010-09-08 ENCOUNTER — Other Ambulatory Visit (HOSPITAL_COMMUNITY): Payer: Medicare Other | Attending: Physician Assistant

## 2010-09-08 DIAGNOSIS — F1420 Cocaine dependence, uncomplicated: Secondary | ICD-10-CM | POA: Insufficient documentation

## 2010-09-08 DIAGNOSIS — Z79899 Other long term (current) drug therapy: Secondary | ICD-10-CM | POA: Insufficient documentation

## 2010-09-08 DIAGNOSIS — Z6379 Other stressful life events affecting family and household: Secondary | ICD-10-CM | POA: Insufficient documentation

## 2010-09-09 ENCOUNTER — Other Ambulatory Visit (HOSPITAL_COMMUNITY): Payer: Self-pay | Admitting: Physician Assistant

## 2010-09-09 ENCOUNTER — Other Ambulatory Visit (HOSPITAL_COMMUNITY): Payer: Medicare Other | Attending: Physician Assistant

## 2010-09-09 DIAGNOSIS — F1420 Cocaine dependence, uncomplicated: Secondary | ICD-10-CM | POA: Insufficient documentation

## 2010-09-10 LAB — URINE DRUGS OF ABUSE SCREEN W ALC, ROUTINE (REF LAB)
Amphetamine Screen, Ur: NEGATIVE
Barbiturate Quant, Ur: NEGATIVE
Benzodiazepines.: NEGATIVE
Cocaine Metabolites: NEGATIVE
Creatinine,U: 113.9 mg/dL
Ethyl Alcohol: <10 mg/dL (ref <10)
Marijuana Metabolite: NEGATIVE
Methadone: NEGATIVE
Opiate Screen, Urine: NEGATIVE
Phencyclidine (PCP): NEGATIVE
Propoxyphene: NEGATIVE

## 2010-09-11 ENCOUNTER — Other Ambulatory Visit: Payer: Self-pay | Admitting: Obstetrics & Gynecology

## 2010-09-11 ENCOUNTER — Other Ambulatory Visit (HOSPITAL_COMMUNITY): Payer: Medicare Other

## 2010-09-11 DIAGNOSIS — R928 Other abnormal and inconclusive findings on diagnostic imaging of breast: Secondary | ICD-10-CM

## 2010-09-14 ENCOUNTER — Other Ambulatory Visit (HOSPITAL_COMMUNITY): Payer: Medicare Other

## 2010-09-15 ENCOUNTER — Other Ambulatory Visit (HOSPITAL_COMMUNITY): Payer: Medicare Other

## 2010-09-16 ENCOUNTER — Other Ambulatory Visit (HOSPITAL_COMMUNITY): Payer: Medicare Other

## 2010-09-18 ENCOUNTER — Ambulatory Visit
Admission: RE | Admit: 2010-09-18 | Discharge: 2010-09-18 | Disposition: A | Discharge status: Home / Self Care | Payer: Medicare Other | Source: Ambulatory Visit | Attending: Obstetrics & Gynecology | Admitting: Obstetrics & Gynecology

## 2010-09-18 ENCOUNTER — Other Ambulatory Visit (HOSPITAL_COMMUNITY): Payer: Medicare Other

## 2010-09-18 DIAGNOSIS — R928 Other abnormal and inconclusive findings on diagnostic imaging of breast: Secondary | ICD-10-CM

## 2010-09-21 ENCOUNTER — Other Ambulatory Visit (HOSPITAL_COMMUNITY): Payer: Medicare Other

## 2010-09-21 ENCOUNTER — Other Ambulatory Visit (HOSPITAL_COMMUNITY): Payer: Self-pay | Admitting: Physician Assistant

## 2010-09-22 LAB — DRUGS OF ABUSE SCREEN W/O ALC, ROUTINE URINE
Amphetamine Screen, Ur: NEGATIVE
Barbiturate Quant, Ur: NEGATIVE
Benzodiazepines.: NEGATIVE
Cocaine Metabolites: NEGATIVE
Creatinine,U: 110.8 mg/dL
Marijuana Metabolite: NEGATIVE
Methadone: NEGATIVE
Opiate Screen, Urine: NEGATIVE
Phencyclidine (PCP): NEGATIVE
Propoxyphene: NEGATIVE

## 2010-09-23 ENCOUNTER — Other Ambulatory Visit (HOSPITAL_COMMUNITY): Payer: Medicare Other

## 2010-09-23 ENCOUNTER — Other Ambulatory Visit (HOSPITAL_COMMUNITY): Payer: Self-pay | Admitting: Physician Assistant

## 2010-09-24 LAB — URINE DRUGS OF ABUSE SCREEN W ALC, ROUTINE (REF LAB)
Amphetamine Screen, Ur: NEGATIVE
Barbiturate Quant, Ur: NEGATIVE
Benzodiazepines.: NEGATIVE
Cocaine Metabolites: NEGATIVE
Creatinine,U: 88.2 mg/dL
Ethyl Alcohol: <10 mg/dL (ref <10)
Marijuana Metabolite: NEGATIVE
Methadone: NEGATIVE
Opiate Screen, Urine: NEGATIVE
Phencyclidine (PCP): NEGATIVE
Propoxyphene: NEGATIVE

## 2010-09-25 ENCOUNTER — Other Ambulatory Visit (HOSPITAL_COMMUNITY): Payer: Medicare Other

## 2010-09-28 ENCOUNTER — Other Ambulatory Visit (HOSPITAL_COMMUNITY): Payer: Medicare Other

## 2010-09-30 ENCOUNTER — Other Ambulatory Visit (HOSPITAL_COMMUNITY): Payer: Medicare Other

## 2010-10-02 ENCOUNTER — Other Ambulatory Visit (HOSPITAL_COMMUNITY): Payer: Self-pay | Admitting: Physician Assistant

## 2010-10-02 ENCOUNTER — Other Ambulatory Visit (HOSPITAL_COMMUNITY): Payer: Medicare Other

## 2010-10-03 LAB — URINE DRUGS OF ABUSE SCREEN W ALC, ROUTINE (REF LAB)
Amphetamine Screen, Ur: NEGATIVE
Barbiturate Quant, Ur: NEGATIVE
Benzodiazepines.: NEGATIVE
Cocaine Metabolites: NEGATIVE
Creatinine,U: 221.4 mg/dL
Ethyl Alcohol: <10 mg/dL (ref <10)
Marijuana Metabolite: NEGATIVE
Methadone: NEGATIVE
Opiate Screen, Urine: NEGATIVE
Phencyclidine (PCP): NEGATIVE
Propoxyphene: NEGATIVE

## 2010-10-05 ENCOUNTER — Other Ambulatory Visit (HOSPITAL_COMMUNITY): Payer: Self-pay | Admitting: Physician Assistant

## 2010-10-05 ENCOUNTER — Other Ambulatory Visit (HOSPITAL_COMMUNITY): Payer: Medicare Other | Attending: Physician Assistant

## 2010-10-05 DIAGNOSIS — F1420 Cocaine dependence, uncomplicated: Secondary | ICD-10-CM | POA: Insufficient documentation

## 2010-10-05 DIAGNOSIS — Z6379 Other stressful life events affecting family and household: Secondary | ICD-10-CM | POA: Insufficient documentation

## 2010-10-05 DIAGNOSIS — Z79899 Other long term (current) drug therapy: Secondary | ICD-10-CM | POA: Insufficient documentation

## 2010-10-06 LAB — URINE DRUGS OF ABUSE SCREEN W ALC, ROUTINE (REF LAB)
Amphetamine Screen, Ur: NEGATIVE
Barbiturate Quant, Ur: NEGATIVE
Benzodiazepines.: NEGATIVE
Cocaine Metabolites: NEGATIVE
Creatinine,U: 75.5 mg/dL
Ethyl Alcohol: <10 mg/dL (ref <10)
Marijuana Metabolite: NEGATIVE
Methadone: NEGATIVE
Opiate Screen, Urine: NEGATIVE
Phencyclidine (PCP): NEGATIVE
Propoxyphene: NEGATIVE

## 2010-10-07 ENCOUNTER — Other Ambulatory Visit (HOSPITAL_COMMUNITY): Payer: Medicare Other

## 2010-10-09 ENCOUNTER — Other Ambulatory Visit (HOSPITAL_COMMUNITY): Payer: Medicare Other

## 2010-10-12 ENCOUNTER — Other Ambulatory Visit (HOSPITAL_COMMUNITY): Payer: Self-pay | Admitting: Physician Assistant

## 2010-10-12 ENCOUNTER — Other Ambulatory Visit (HOSPITAL_COMMUNITY): Payer: Medicare Other

## 2010-10-13 LAB — COMPREHENSIVE METABOLIC PANEL
ALT: 11
AST: 16
Albumin: 3.7
Alkaline Phosphatase: 54
BUN: 12
CO2: 26
Calcium: 9.3
Chloride: 107
Creatinine, Ser: 0.75
GFR calc Af Amer: >60
GFR calc non Af Amer: >60
Glucose, Bld: 114 — ABNORMAL HIGH
Potassium: 3.3 — ABNORMAL LOW
Sodium: 141
Total Bilirubin: 0.8
Total Protein: 6.3

## 2010-10-13 LAB — URINE DRUGS OF ABUSE SCREEN W ALC, ROUTINE (REF LAB)
Amphetamine Screen, Ur: NEGATIVE
Barbiturate Quant, Ur: NEGATIVE
Benzodiazepines.: NEGATIVE
Cocaine Metabolites: NEGATIVE
Creatinine,U: 133.1 mg/dL
Ethyl Alcohol: <10 mg/dL (ref <10)
Marijuana Metabolite: NEGATIVE
Methadone: NEGATIVE
Opiate Screen, Urine: NEGATIVE
Phencyclidine (PCP): NEGATIVE
Propoxyphene: NEGATIVE

## 2010-10-13 LAB — DIFFERENTIAL
Basophils Absolute: 0
Basophils Relative: 0
Eosinophils Absolute: 0.3
Eosinophils Relative: 2
Lymphocytes Relative: 8 — ABNORMAL LOW
Lymphs Abs: 1.1
Monocytes Absolute: 0.8
Monocytes Relative: 6
Neutro Abs: 11.7 — ABNORMAL HIGH
Neutrophils Relative %: 84 — ABNORMAL HIGH

## 2010-10-13 LAB — URINALYSIS, ROUTINE W REFLEX MICROSCOPIC
Glucose, UA: 250 — AB
Nitrite: POSITIVE — AB
Protein, ur: 100 — AB
Specific Gravity, Urine: 1.02
Urobilinogen, UA: >8 — ABNORMAL HIGH
pH: 6.5

## 2010-10-13 LAB — CBC
HCT: 37
Hemoglobin: 12.8
MCHC: 34.6
MCV: 91.2
Platelets: 231
RBC: 4.06
RDW: 12.4
WBC: 14 — ABNORMAL HIGH

## 2010-10-13 LAB — URINE MICROSCOPIC-ADD ON

## 2010-10-13 LAB — LIPASE, BLOOD: Lipase: 25

## 2010-10-14 ENCOUNTER — Other Ambulatory Visit (HOSPITAL_COMMUNITY): Payer: Medicare Other

## 2010-10-16 ENCOUNTER — Other Ambulatory Visit (HOSPITAL_COMMUNITY): Payer: Medicare Other

## 2010-10-19 ENCOUNTER — Other Ambulatory Visit (HOSPITAL_COMMUNITY): Payer: Medicare Other

## 2010-10-21 ENCOUNTER — Other Ambulatory Visit (HOSPITAL_COMMUNITY): Payer: Medicare Other

## 2010-10-23 ENCOUNTER — Other Ambulatory Visit (HOSPITAL_COMMUNITY): Payer: Medicare Other

## 2010-10-23 ENCOUNTER — Other Ambulatory Visit (HOSPITAL_COMMUNITY): Payer: Self-pay | Admitting: Physician Assistant

## 2010-10-24 LAB — URINE DRUGS OF ABUSE SCREEN W ALC, ROUTINE (REF LAB)
Amphetamine Screen, Ur: NEGATIVE
Barbiturate Quant, Ur: NEGATIVE
Benzodiazepines.: NEGATIVE
Cocaine Metabolites: NEGATIVE
Creatinine,U: 179.2 mg/dL
Ethyl Alcohol: <10 mg/dL (ref <10)
Marijuana Metabolite: NEGATIVE
Methadone: NEGATIVE
Opiate Screen, Urine: NEGATIVE
Phencyclidine (PCP): NEGATIVE
Propoxyphene: NEGATIVE

## 2010-10-26 ENCOUNTER — Other Ambulatory Visit (HOSPITAL_COMMUNITY): Payer: Self-pay | Admitting: Physician Assistant

## 2010-10-26 ENCOUNTER — Other Ambulatory Visit (HOSPITAL_COMMUNITY): Payer: Medicare Other

## 2010-10-27 LAB — URINE DRUGS OF ABUSE SCREEN W ALC, ROUTINE (REF LAB)
Amphetamine Screen, Ur: NEGATIVE
Barbiturate Quant, Ur: NEGATIVE
Benzodiazepines.: NEGATIVE
Cocaine Metabolites: NEGATIVE
Creatinine,U: 130.7 mg/dL
Ethyl Alcohol: <10 mg/dL (ref <10)
Marijuana Metabolite: NEGATIVE
Methadone: NEGATIVE
Opiate Screen, Urine: NEGATIVE
Phencyclidine (PCP): NEGATIVE
Propoxyphene: NEGATIVE

## 2010-10-28 ENCOUNTER — Other Ambulatory Visit (HOSPITAL_COMMUNITY): Payer: Medicare Other

## 2010-10-30 ENCOUNTER — Other Ambulatory Visit (HOSPITAL_COMMUNITY): Payer: Medicare Other

## 2010-11-02 ENCOUNTER — Other Ambulatory Visit (HOSPITAL_COMMUNITY): Payer: Medicare Other

## 2010-11-04 ENCOUNTER — Other Ambulatory Visit (HOSPITAL_COMMUNITY): Payer: Self-pay | Admitting: Physician Assistant

## 2010-11-04 ENCOUNTER — Other Ambulatory Visit (HOSPITAL_COMMUNITY): Payer: Medicare Other

## 2010-11-05 LAB — DRUGS OF ABUSE SCREEN W/O ALC, ROUTINE URINE
Amphetamine Screen, Ur: NEGATIVE
Barbiturate Quant, Ur: NEGATIVE
Benzodiazepines.: NEGATIVE
Cocaine Metabolites: NEGATIVE
Creatinine,U: 156 mg/dL
Marijuana Metabolite: NEGATIVE
Methadone: NEGATIVE
Opiate Screen, Urine: NEGATIVE
Phencyclidine (PCP): NEGATIVE
Propoxyphene: NEGATIVE

## 2010-11-06 ENCOUNTER — Other Ambulatory Visit (HOSPITAL_COMMUNITY): Payer: Medicare Other | Attending: Physician Assistant

## 2010-11-06 DIAGNOSIS — F1420 Cocaine dependence, uncomplicated: Secondary | ICD-10-CM | POA: Insufficient documentation

## 2010-11-06 DIAGNOSIS — Z79899 Other long term (current) drug therapy: Secondary | ICD-10-CM | POA: Insufficient documentation

## 2010-11-06 DIAGNOSIS — F259 Schizoaffective disorder, unspecified: Secondary | ICD-10-CM | POA: Insufficient documentation

## 2010-11-06 DIAGNOSIS — Z6379 Other stressful life events affecting family and household: Secondary | ICD-10-CM | POA: Insufficient documentation

## 2011-01-06 ENCOUNTER — Ambulatory Visit (HOSPITAL_COMMUNITY): Payer: Medicare Other | Admitting: Physician Assistant

## 2011-01-16 IMAGING — CR DG ABDOMEN ACUTE W/ 1V CHEST
3 series · 3 of 3 positions shown · non-contrast
Comparison: 11/22/2006

CLINICAL DATA: Severe abdominal pain with nausea and vomiting.

ACUTE ABDOMEN SERIES (ABDOMEN 2 VIEW & CHEST 1 VIEW)

[view not recorded (1 of 3)]
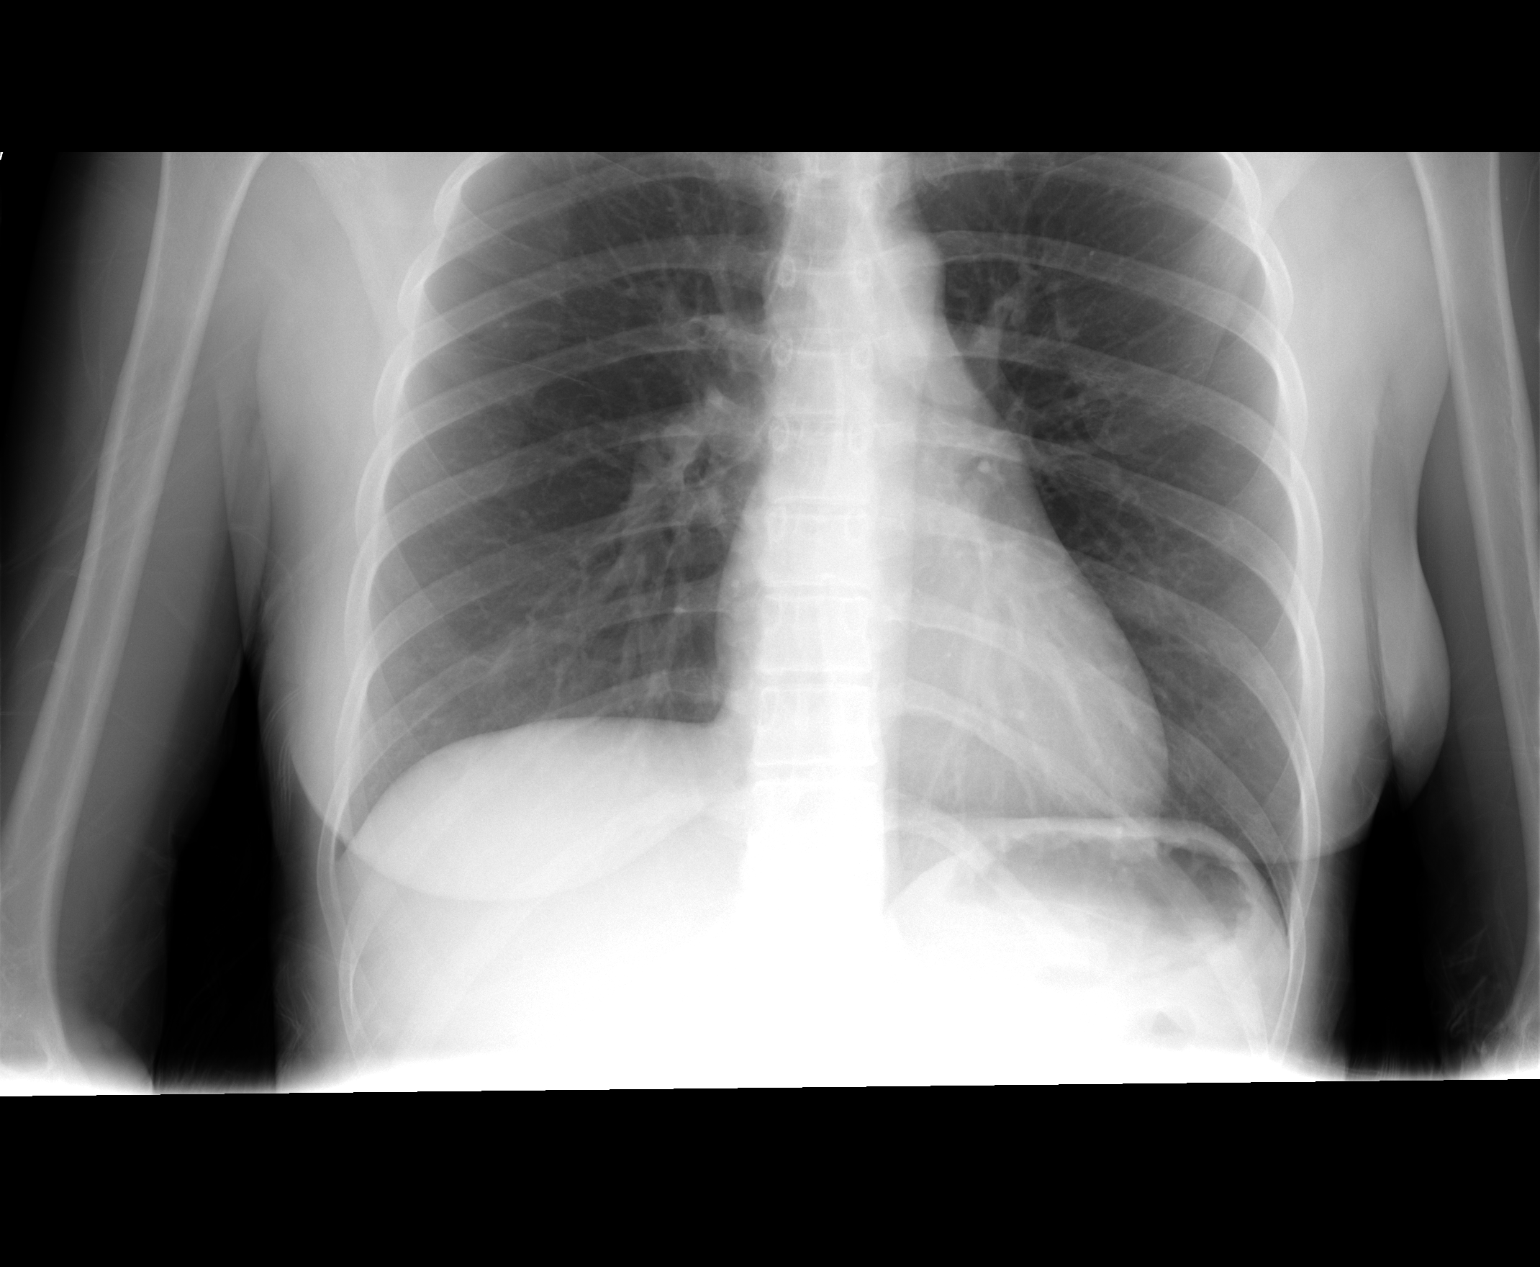

[view not recorded (2 of 3)]
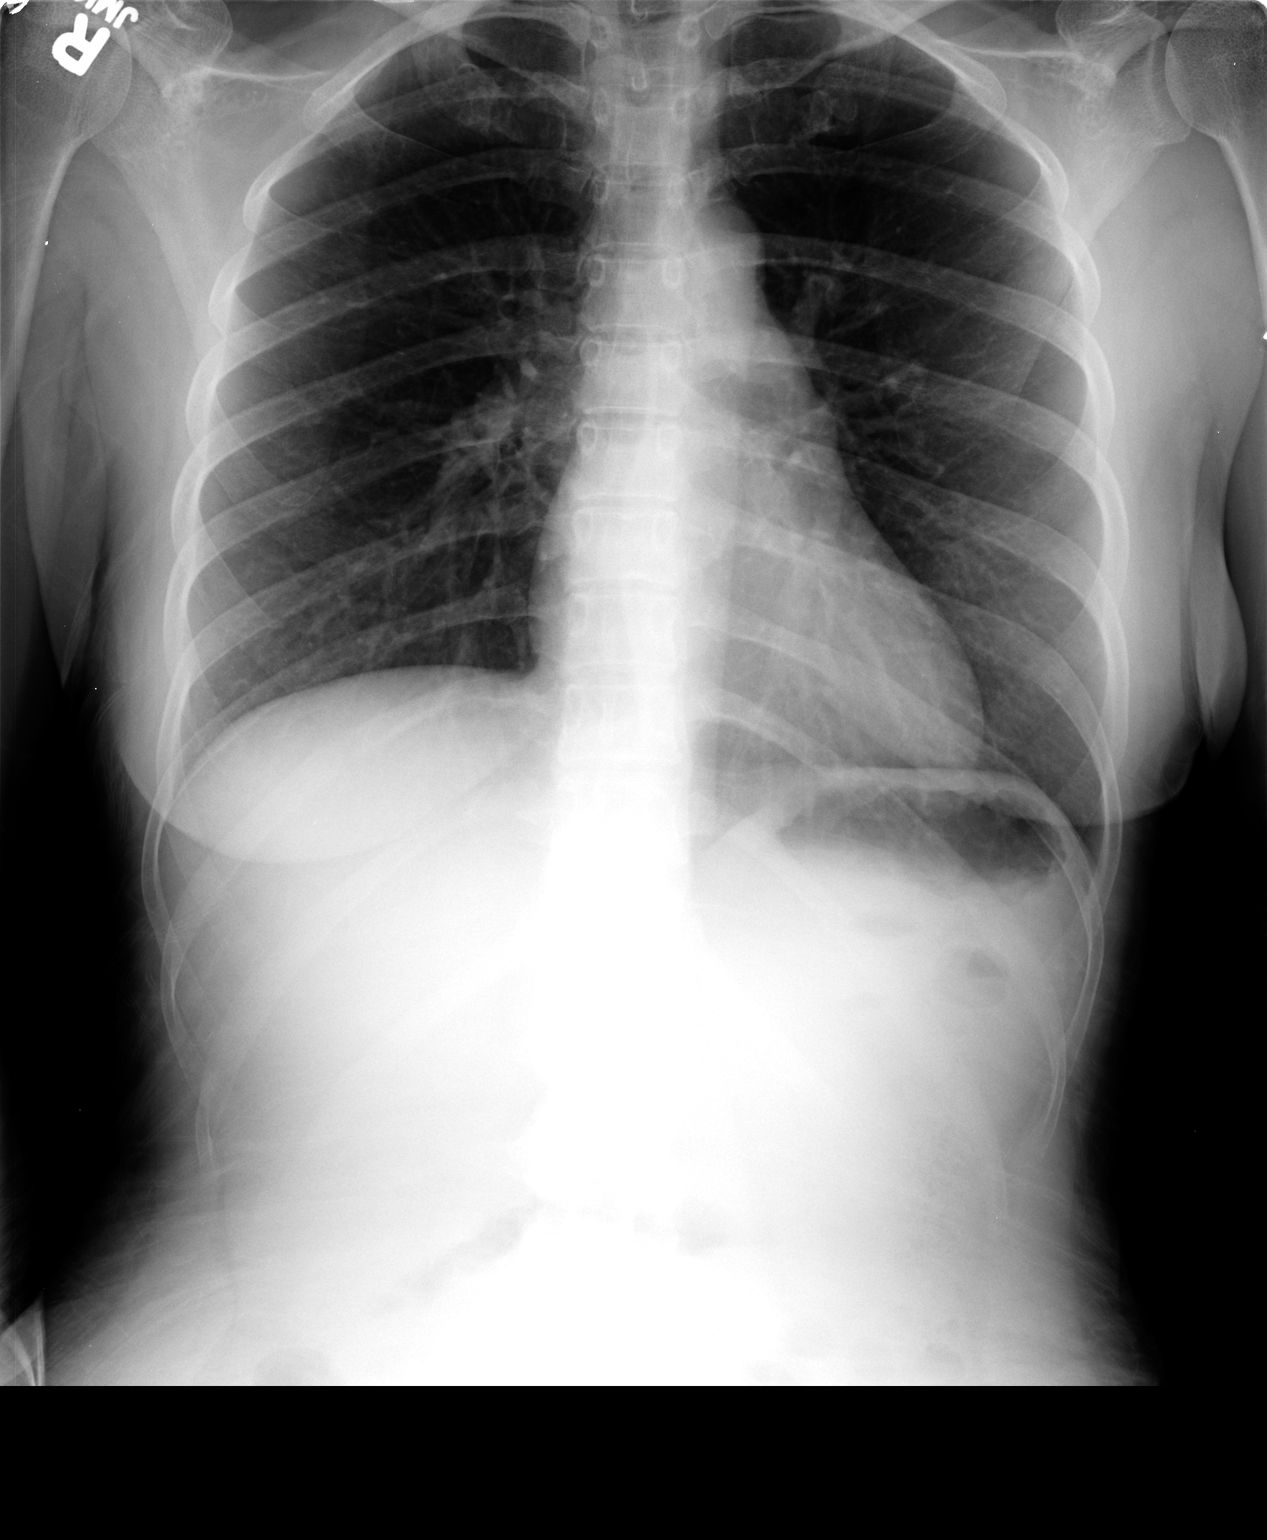

[view not recorded (3 of 3)]
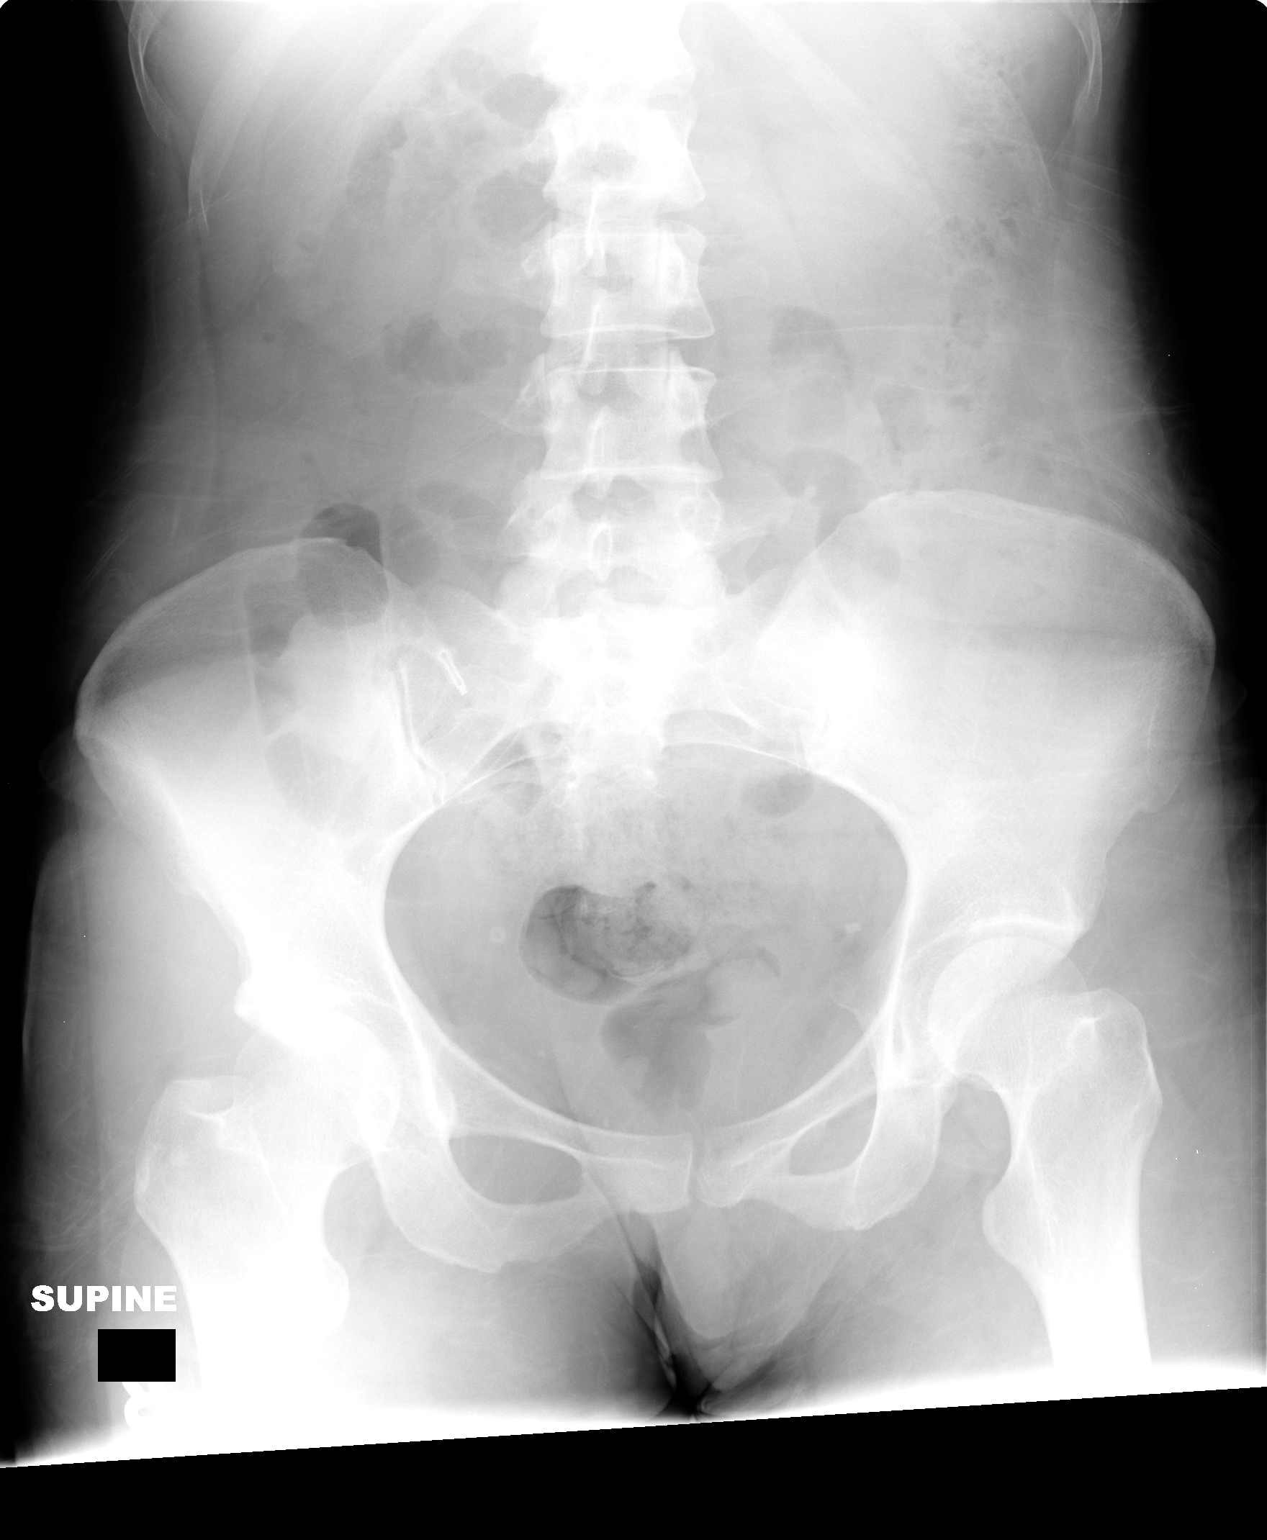

[3 of 3 positions shown; findings below may reference images not displayed]

FINDINGS: Heart and lungs appear normal.  No free air or free fluid
in the abdomen.  The bowel gas pattern is normal.  Surgical clip in
the right lower quadrant, probably from previous appendectomy.
Tubal ligation rings are noted in the pelvis.  No bony abnormality.
IMPRESSION: Benign-appearing abdomen and chest.

## 2011-02-21 ENCOUNTER — Emergency Department (HOSPITAL_COMMUNITY)
Admission: EM | Admit: 2011-02-21 | Discharge: 2011-02-21 | Disposition: A | Discharge status: Home / Self Care | Payer: Medicare Other | Attending: Emergency Medicine | Admitting: Emergency Medicine

## 2011-02-21 ENCOUNTER — Emergency Department (HOSPITAL_COMMUNITY): Payer: Medicare Other

## 2011-02-21 ENCOUNTER — Encounter (HOSPITAL_COMMUNITY): Payer: Self-pay

## 2011-02-21 DIAGNOSIS — Z9079 Acquired absence of other genital organ(s): Secondary | ICD-10-CM | POA: Insufficient documentation

## 2011-02-21 DIAGNOSIS — R1032 Left lower quadrant pain: Secondary | ICD-10-CM | POA: Insufficient documentation

## 2011-02-21 DIAGNOSIS — R197 Diarrhea, unspecified: Secondary | ICD-10-CM | POA: Insufficient documentation

## 2011-02-21 DIAGNOSIS — K5900 Constipation, unspecified: Secondary | ICD-10-CM | POA: Insufficient documentation

## 2011-02-21 DIAGNOSIS — R10814 Left lower quadrant abdominal tenderness: Secondary | ICD-10-CM | POA: Insufficient documentation

## 2011-02-21 DIAGNOSIS — R112 Nausea with vomiting, unspecified: Secondary | ICD-10-CM | POA: Insufficient documentation

## 2011-02-21 DIAGNOSIS — Z9889 Other specified postprocedural states: Secondary | ICD-10-CM | POA: Insufficient documentation

## 2011-02-21 DIAGNOSIS — F17200 Nicotine dependence, unspecified, uncomplicated: Secondary | ICD-10-CM | POA: Insufficient documentation

## 2011-02-21 HISTORY — DX: Diverticulitis of intestine, part unspecified, without perforation or abscess without bleeding: K57.92

## 2011-02-21 LAB — COMPREHENSIVE METABOLIC PANEL
ALT: 10 U/L (ref 0–35)
AST: 11 U/L (ref 0–37)
Albumin: 3.8 g/dL (ref 3.5–5.2)
Alkaline Phosphatase: 55 U/L (ref 39–117)
BUN: 13 mg/dL (ref 6–23)
CO2: 28 mEq/L (ref 19–32)
Calcium: 8.9 mg/dL (ref 8.4–10.5)
Chloride: 102 mEq/L (ref 96–112)
Creatinine, Ser: 0.55 mg/dL (ref 0.50–1.10)
GFR calc Af Amer: >90 mL/min (ref >90)
GFR calc non Af Amer: >90 mL/min (ref >90)
Glucose, Bld: 145 mg/dL — ABNORMAL HIGH (ref 70–99)
Potassium: 3.4 mEq/L — ABNORMAL LOW (ref 3.5–5.1)
Sodium: 137 mEq/L (ref 135–145)
Total Bilirubin: 0.4 mg/dL (ref 0.3–1.2)
Total Protein: 6.7 g/dL (ref 6.0–8.3)

## 2011-02-21 LAB — CBC
HCT: 36.5 % (ref 36.0–46.0)
Hemoglobin: 12.9 g/dL (ref 12.0–15.0)
MCH: 31.9 pg (ref 26.0–34.0)
MCHC: 35.3 g/dL (ref 30.0–36.0)
MCV: 90.3 fL (ref 78.0–100.0)
Platelets: 211 10*3/uL (ref 150–400)
RBC: 4.04 MIL/uL (ref 3.87–5.11)
RDW: 11.9 % (ref 11.5–15.5)
WBC: 7.5 10*3/uL (ref 4.0–10.5)

## 2011-02-21 LAB — DIFFERENTIAL
Basophils Absolute: 0 10*3/uL (ref 0.0–0.1)
Basophils Relative: 0 % (ref 0–1)
Eosinophils Absolute: 0.3 10*3/uL (ref 0.0–0.7)
Eosinophils Relative: 4 % (ref 0–5)
Lymphocytes Relative: 16 % (ref 12–46)
Lymphs Abs: 1.2 10*3/uL (ref 0.7–4.0)
Monocytes Absolute: 0.4 10*3/uL (ref 0.1–1.0)
Monocytes Relative: 6 % (ref 3–12)
Neutro Abs: 5.5 10*3/uL (ref 1.7–7.7)
Neutrophils Relative %: 73 % (ref 43–77)

## 2011-02-21 LAB — URINALYSIS, ROUTINE W REFLEX MICROSCOPIC
Bilirubin Urine: NEGATIVE
Glucose, UA: NEGATIVE mg/dL
Hgb urine dipstick: NEGATIVE
Ketones, ur: NEGATIVE mg/dL
Leukocytes, UA: NEGATIVE
Nitrite: NEGATIVE
Protein, ur: NEGATIVE mg/dL
Specific Gravity, Urine: 1.02 (ref 1.005–1.030)
Urobilinogen, UA: 0.2 mg/dL (ref 0.0–1.0)
pH: 7 (ref 5.0–8.0)

## 2011-02-21 LAB — RAPID URINE DRUG SCREEN, HOSP PERFORMED
Amphetamines: NOT DETECTED
Barbiturates: NOT DETECTED
Benzodiazepines: NOT DETECTED
Cocaine: NOT DETECTED
Opiates: NOT DETECTED
Tetrahydrocannabinol: NOT DETECTED

## 2011-02-21 MED ORDER — ONDANSETRON HCL 4 MG/2ML IJ SOLN
4.0000 mg | INTRAMUSCULAR | Status: DC | PRN
  Administered 2011-02-21 (×2): 4 mg via INTRAVENOUS
  Filled 2011-02-21 (×2): qty 2

## 2011-02-21 MED ORDER — SODIUM CHLORIDE 0.9 % IV SOLN
INTRAVENOUS | Status: DC
  Administered 2011-02-21: 15:00:00 via INTRAVENOUS

## 2011-02-21 MED ORDER — MORPHINE SULFATE 4 MG/ML IJ SOLN
4.0000 mg | INTRAMUSCULAR | Status: DC | PRN
  Administered 2011-02-21: 4 mg via INTRAVENOUS
  Filled 2011-02-21: qty 1

## 2011-02-21 MED ORDER — IOHEXOL 300 MG/ML  SOLN
40.0000 mL | Freq: Once | INTRAMUSCULAR | Status: AC | PRN
  Administered 2011-02-21: 40 mL via ORAL

## 2011-02-21 MED ORDER — ONDANSETRON 8 MG PO TBDP: 8.0000 mg | ORAL_TABLET | Freq: Three times a day (TID) | ORAL | Status: AC | PRN

## 2011-02-21 MED ORDER — IOHEXOL 300 MG/ML  SOLN
100.0000 mL | Freq: Once | INTRAMUSCULAR | Status: AC | PRN
  Administered 2011-02-21: 100 mL via INTRAVENOUS

## 2011-02-21 MED ORDER — SODIUM CHLORIDE 0.9 % IV BOLUS (SEPSIS)
1000.0000 mL | Freq: Once | INTRAVENOUS | Status: AC
  Administered 2011-02-21: 1000 mL via INTRAVENOUS

## 2011-02-21 NOTE — ED Notes (Signed)
Pt presents with LLQ pain and vomiting that started today. Pt states she has a HX of diverticulitis.

## 2011-02-21 NOTE — ED Notes (Signed)
Family at bedside. Patient states she needs some more nausea medicine that the Zofran isn't helping.

## 2011-02-21 NOTE — ED Provider Notes (Signed)
History     CSN: [id number]  Arrival date & time 02/21/11  1331   First MD Initiated Contact with Patient 02/21/11 1412      Chief Complaint  Patient presents with  . Abdominal Pain  . Emesis    (Consider location/radiation/quality/duration/timing/severity/associated sxs/prior treatment) HPI  Patient relates she had acute onset of left lower quadrant pain today about 8:30 in the morning. She denies that the pain radiates. The pain is sharp and sometimes dull. She had nausea and vomiting. She states movement and walking or changing positions makes the pain hurt worse. Nothing makes it feel better. She states for the past 2 weeks she has felt sluggish and has had constipation alternating with diarrhea. Her husband relates she's been eating a lot of poppyseed and nuts. She denies fever or. She states last night she did have some diarrhea. She states this pain feels just like when she had diverticulitis and she had a partial colectomy done about 2 years ago. This is the first time she's had pain since her surgery.  PCP Dr. Ludwig Clarks at [facility] Dr. Darrick Penna  Past Medical History  Diagnosis Date  . Diverticulitis     Past Surgical History  Procedure Date  . Appendectomy   . Abdominal hysterectomy   . Cesarean section   . Colon resection     No family history on file.  History  Substance Use Topics  . Smoking status: Current Everyday Smoker  . Smokeless tobacco: Not on file  . Alcohol Use: No   lives with spouse  OB History    Grav Para Term Preterm Abortions TAB SAB Ect Mult Living                  Review of Systems  All other systems reviewed and are negative.    Allergies  Review of patient's allergies indicates no known allergies.  Home Medications   Current Outpatient Rx  Name Route Sig Dispense Refill  . ATOMOXETINE HCL 40 MG PO CAPS Oral Take 40 mg by mouth daily.    [name] FLUOXETINE HCL 20 MG PO CAPS Oral  Take 20 mg by mouth daily.    [name] LEVOBUNOLOL HCL 0.25 % OP SOLN  1 drop at bedtime.    [name] LEVOCETIRIZINE DIHYDROCHLORIDE 5 MG PO TABS Oral Take 5 mg by mouth every evening.    [name] PANTOPRAZOLE SODIUM 40 MG PO TBEC Oral Take 40 mg by mouth daily.      BP 100/53  Pulse 60  Temp(Src) 97.2 F (36.2 C) (Oral)  Resp 16  Ht 5\' 3"  (1.6 m)  Wt 109 lb (49.442 kg)  BMI 19.31 kg/m2  SpO2 100%  Vital signs normal    Physical Exam  Nursing note and vitals reviewed. Constitutional: She is oriented to person, place, and time. She appears well-developed and well-nourished.  Non-toxic appearance. She does not appear ill. No distress.  HENT:  Head: Normocephalic and atraumatic.  Right Ear: External ear normal.  Left Ear: External ear normal.  Nose: Nose normal. No mucosal edema or rhinorrhea.  Mouth/Throat: Oropharynx is clear and moist and mucous membranes are normal. No dental abscesses or uvula swelling.  Eyes: Conjunctivae and EOM are normal. Pupils are equal, round, and reactive to light.  Neck: Normal range of motion and full passive range of motion without pain. Neck supple.  Cardiovascular: Normal rate, regular rhythm and normal heart sounds.  Exam reveals no gallop and no friction rub.  No murmur heard. Pulmonary/Chest: Effort normal and breath sounds normal. No respiratory distress. She has no wheezes. She has no rhonchi. She has no rales. She exhibits no tenderness and no crepitus.  Abdominal: Soft. Normal appearance and bowel sounds are normal. She exhibits no distension. There is tenderness. There is no rebound and no guarding.       She has tenderness in her left lower quadrant also mildly across her lower abdomen but most tender in the left lower quadrant.  Musculoskeletal: Normal range of motion. She exhibits no edema and no tenderness.       Moves all extremities well.   Neurological: She is alert and oriented to person, place, and time. She has normal strength. No cranial nerve  deficit.  Skin: Skin is warm, dry and intact. No rash noted. No erythema. No pallor.  Psychiatric: She has a normal mood and affect. Her speech is normal and behavior is normal. Her mood appears not anxious.    ED Course  Procedures (including critical care time)   Medications  0.9 %  sodium chloride infusion (  Intravenous New Bag/Given 02/21/11 1518)  morphine 4 MG/ML injection 4 mg (4 mg Intravenous Given 02/21/11 1518)  ondansetron (ZOFRAN) injection 4 mg (4 mg Intravenous Given 02/21/11 1720)  sodium chloride 0.9 % bolus 1,000 mL (1000 mL Intravenous Given 02/21/11 1431)  iohexol (OMNIPAQUE) 300 MG/ML solution 40 mL (40 mL Oral Contrast Given 02/21/11 1640)  iohexol (OMNIPAQUE) 300 MG/ML solution 100 mL (100 mL Intravenous Contrast Given 02/21/11 1642)   Pt given results of CT scan and we have discussed treatment plan.    Results for orders placed during the hospital encounter of 02/21/11  CBC      Component Value Range   WBC 7.5  4.0 - 10.5 (K/uL)   RBC 4.04  3.87 - 5.11 (MIL/uL)   Hemoglobin 12.9  12.0 - 15.0 (g/dL)   HCT 65.7  84.6 - 96.2 (%)   MCV 90.3  78.0 - 100.0 (fL)   MCH 31.9  26.0 - 34.0 (pg)   MCHC 35.3  30.0 - 36.0 (g/dL)   RDW 95.2  84.1 - 32.4 (%)   Platelets 211  150 - 400 (K/uL)  DIFFERENTIAL      Component Value Range   Neutrophils Relative 73  43 - 77 (%)   Neutro Abs 5.5  1.7 - 7.7 (K/uL)   Lymphocytes Relative 16  12 - 46 (%)   Lymphs Abs 1.2  0.7 - 4.0 (K/uL)   Monocytes Relative 6  3 - 12 (%)   Monocytes Absolute 0.4  0.1 - 1.0 (K/uL)   Eosinophils Relative 4  0 - 5 (%)   Eosinophils Absolute 0.3  0.0 - 0.7 (K/uL)   Basophils Relative 0  0 - 1 (%)   Basophils Absolute 0.0  0.0 - 0.1 (K/uL)  COMPREHENSIVE METABOLIC PANEL      Component Value Range   Sodium 137  135 - 145 (mEq/L)   Potassium 3.4 (*) 3.5 - 5.1 (mEq/L)   Chloride 102  96 - 112 (mEq/L)   CO2 28  19 - 32 (mEq/L)   Glucose, Bld 145 (*) 70 - 99 (mg/dL)   BUN 13  6 - 23 (mg/dL)    Creatinine, Ser 4.01  0.50 - 1.10 (mg/dL)   Calcium 8.9  8.4 - 02.7 (mg/dL)   Total Protein 6.7  6.0 - 8.3 (g/dL)   Albumin 3.8  3.5 - 5.2 (g/dL)   AST  11  0 - 37 (U/L)   ALT 10  0 - 35 (U/L)   Alkaline Phosphatase 55  39 - 117 (U/L)   Total Bilirubin 0.4  0.3 - 1.2 (mg/dL)   GFR calc non Af Amer >90  >90 (mL/min)   GFR calc Af Amer >90  >90 (mL/min)  URINALYSIS, ROUTINE W REFLEX MICROSCOPIC      Component Value Range   Color, Urine YELLOW  YELLOW    APPearance CLOUDY (*) CLEAR    Specific Gravity, Urine 1.020  1.005 - 1.030    pH 7.0  5.0 - 8.0    Glucose, UA NEGATIVE  NEGATIVE (mg/dL)   Hgb urine dipstick NEGATIVE  NEGATIVE    Bilirubin Urine NEGATIVE  NEGATIVE    Ketones, ur NEGATIVE  NEGATIVE (mg/dL)   Protein, ur NEGATIVE  NEGATIVE (mg/dL)   Urobilinogen, UA 0.2  0.0 - 1.0 (mg/dL)   Nitrite NEGATIVE  NEGATIVE    Leukocytes, UA NEGATIVE  NEGATIVE   URINE RAPID DRUG SCREEN (HOSP PERFORMED)      Component Value Range   Opiates NONE DETECTED  NONE DETECTED    Cocaine NONE DETECTED  NONE DETECTED    Benzodiazepines NONE DETECTED  NONE DETECTED    Amphetamines NONE DETECTED  NONE DETECTED    Tetrahydrocannabinol NONE DETECTED  NONE DETECTED    Barbiturates NONE DETECTED  NONE DETECTED    Laboratory interpretation all normal    Ct Abdomen Pelvis W Contrast  02/21/2011  *RADIOLOGY REPORT*  Clinical Data: Abdominal pain.  Vomiting.  Left lower quadrant pain.  History of colectomy for diverticulitis.  CT ABDOMEN AND PELVIS WITH CONTRAST  Technique:  Multidetector CT imaging of the abdomen and pelvis was performed following the standard protocol during bolus administration of intravenous contrast.  Contrast: 40mL OMNIPAQUE IOHEXOL 300 MG/ML IV SOLN, OMNIPAQUE IOHEXOL 300 MG/ML IV SOLN  Comparison: 11/24/2009.  Findings: Dependent atelectasis is present at the lung bases. Gallbladder appears normal.  Liver shows mild periportal edema due to volume status.  Normal renal enhancement  with right inferior poles cysts.  Left kidney appears normal.  Adrenal glands are normal.  The spleen appears normal.  Pancreas and common bile duct are normal.  The stomach appears normal.  Small bowel is within normal limits.  There is no small bowel dilation.  Prominent stool burden is present in the distal colon.  Sub total colectomy is present with anastomoses in the pelvis.  Fecalization of small bowel in the right lower quadrant is probably secondary to colectomy.  No obstructive features are identified.  Hysterectomy. Ovaries appear within normal limits.  Urinary bladder normal. Benign bone island in the right femur.  IMPRESSION: 1.  No acute abnormality. 2.  Sub total colectomy.  No evidence of small or large bowel obstruction.  Widely patent anastomosis in the anatomic pelvis. Large stool burden in the residual colon. 3.  Right renal cysts. 4.  Mild periportal edema due to the high volume status.  Original Report Authenticated By: Andreas Newport, M.D.   Dg Abd Acute W/chest  02/21/2011  *RADIOLOGY REPORT*  Clinical Data: Lower abdominal pain.  ACUTE ABDOMEN SERIES (ABDOMEN 2 VIEW & CHEST 1 VIEW)  Comparison: None.  Findings: Lungs clear.  Cardiopericardial silhouette within normal limits.  Trachea midline.  No airspace disease or effusion. Bowel gas pattern is within normal limits.  No pathologic air fluid levels are identified.   Stool and bowel gas present in the rectosigmoid.  Large stool ball  is present in the region of the rectosigmoid.  IMPRESSION: Normal bowel gas pattern.  No active cardiopulmonary disease.  Original Report Authenticated By: Andreas Newport, M.D.      1. Abdominal pain   2. Constipation    Miralax regimine  Plan discharge Devoria Albe, MD, FACEP    MDM          Ward Givens, MD 02/21/11 [phone]

## 2011-02-21 NOTE — Discharge Instructions (Signed)
Take MiraLAX one dose in 8 ounces water every 30 minutes for the next 2-3 hours or until you get good results. Then take it once a day to prevent constipation, especially if you are going to eat a lot of high fiber foods such as nuts or seeds. Recheck if you are not improving or you seem worse.

## 2011-03-29 ENCOUNTER — Encounter (HOSPITAL_COMMUNITY): Payer: Self-pay | Admitting: *Deleted

## 2011-03-29 ENCOUNTER — Emergency Department (HOSPITAL_COMMUNITY): Payer: Medicare Other

## 2011-03-29 ENCOUNTER — Emergency Department (HOSPITAL_COMMUNITY)
Admission: EM | Admit: 2011-03-29 | Discharge: 2011-03-29 | Disposition: A | Discharge status: Home / Self Care | Payer: Medicare Other | Attending: Emergency Medicine | Admitting: Emergency Medicine

## 2011-03-29 DIAGNOSIS — R112 Nausea with vomiting, unspecified: Secondary | ICD-10-CM

## 2011-03-29 DIAGNOSIS — R109 Unspecified abdominal pain: Secondary | ICD-10-CM | POA: Insufficient documentation

## 2011-03-29 DIAGNOSIS — Z79899 Other long term (current) drug therapy: Secondary | ICD-10-CM | POA: Insufficient documentation

## 2011-03-29 DIAGNOSIS — F17200 Nicotine dependence, unspecified, uncomplicated: Secondary | ICD-10-CM | POA: Insufficient documentation

## 2011-03-29 DIAGNOSIS — R197 Diarrhea, unspecified: Secondary | ICD-10-CM

## 2011-03-29 DIAGNOSIS — IMO0001 Reserved for inherently not codable concepts without codable children: Secondary | ICD-10-CM | POA: Insufficient documentation

## 2011-03-29 LAB — COMPREHENSIVE METABOLIC PANEL
ALT: 26 U/L (ref 0–35)
AST: 15 U/L (ref 0–37)
Albumin: 4.3 g/dL (ref 3.5–5.2)
Alkaline Phosphatase: 63 U/L (ref 39–117)
BUN: 19 mg/dL (ref 6–23)
CO2: 25 mEq/L (ref 19–32)
Calcium: 9.5 mg/dL (ref 8.4–10.5)
Chloride: 101 mEq/L (ref 96–112)
Creatinine, Ser: 0.61 mg/dL (ref 0.50–1.10)
GFR calc Af Amer: >90 mL/min (ref >90)
GFR calc non Af Amer: >90 mL/min (ref >90)
Glucose, Bld: 139 mg/dL — ABNORMAL HIGH (ref 70–99)
Potassium: 4 mEq/L (ref 3.5–5.1)
Sodium: 137 mEq/L (ref 135–145)
Total Bilirubin: 0.8 mg/dL (ref 0.3–1.2)
Total Protein: 8.2 g/dL (ref 6.0–8.3)

## 2011-03-29 LAB — URINALYSIS, ROUTINE W REFLEX MICROSCOPIC
Bilirubin Urine: NEGATIVE
Glucose, UA: 100 mg/dL — AB
Ketones, ur: NEGATIVE mg/dL
Leukocytes, UA: NEGATIVE
Nitrite: NEGATIVE
Specific Gravity, Urine: 1.025 (ref 1.005–1.030)
Urobilinogen, UA: 0.2 mg/dL (ref 0.0–1.0)
pH: 6 (ref 5.0–8.0)

## 2011-03-29 LAB — CBC
HCT: 41.4 % (ref 36.0–46.0)
Hemoglobin: 14.4 g/dL (ref 12.0–15.0)
MCH: 30.9 pg (ref 26.0–34.0)
MCHC: 34.8 g/dL (ref 30.0–36.0)
MCV: 88.8 fL (ref 78.0–100.0)
Platelets: 194 10*3/uL (ref 150–400)
RBC: 4.66 MIL/uL (ref 3.87–5.11)
RDW: 12.2 % (ref 11.5–15.5)
WBC: 10.5 10*3/uL (ref 4.0–10.5)

## 2011-03-29 LAB — DIFFERENTIAL
Basophils Absolute: 0 10*3/uL (ref 0.0–0.1)
Basophils Relative: 0 % (ref 0–1)
Eosinophils Absolute: 0.1 10*3/uL (ref 0.0–0.7)
Eosinophils Relative: 1 % (ref 0–5)
Lymphocytes Relative: 4 % — ABNORMAL LOW (ref 12–46)
Lymphs Abs: 0.4 10*3/uL — ABNORMAL LOW (ref 0.7–4.0)
Monocytes Absolute: 0.3 10*3/uL (ref 0.1–1.0)
Monocytes Relative: 3 % (ref 3–12)
Neutro Abs: 9.7 10*3/uL — ABNORMAL HIGH (ref 1.7–7.7)
Neutrophils Relative %: 93 % — ABNORMAL HIGH (ref 43–77)

## 2011-03-29 LAB — URINE MICROSCOPIC-ADD ON

## 2011-03-29 LAB — LIPASE, BLOOD: Lipase: 18 U/L (ref 11–59)

## 2011-03-29 LAB — POCT PREGNANCY, URINE: Preg Test, Ur: NEGATIVE

## 2011-03-29 MED ORDER — PROMETHAZINE HCL 25 MG RE SUPP: 25.0000 mg | Freq: Four times a day (QID) | RECTAL | Status: DC | PRN

## 2011-03-29 MED ORDER — PROMETHAZINE HCL 25 MG/ML IJ SOLN
12.5000 mg | Freq: Once | INTRAMUSCULAR | Status: AC
  Administered 2011-03-29: 12.5 mg via INTRAVENOUS
  Filled 2011-03-29: qty 1

## 2011-03-29 MED ORDER — SODIUM CHLORIDE 0.9 % IV BOLUS (SEPSIS)
1000.0000 mL | Freq: Once | INTRAVENOUS | Status: AC
  Administered 2011-03-29: 1000 mL via INTRAVENOUS

## 2011-03-29 MED ORDER — ONDANSETRON HCL 4 MG/2ML IJ SOLN
4.0000 mg | INTRAMUSCULAR | Status: DC | PRN
  Administered 2011-03-29: 4 mg via INTRAVENOUS
  Filled 2011-03-29: qty 2

## 2011-03-29 MED ORDER — FAMOTIDINE IN NACL 20-0.9 MG/50ML-% IV SOLN
20.0000 mg | Freq: Once | INTRAVENOUS | Status: AC
  Administered 2011-03-29: 20 mg via INTRAVENOUS
  Filled 2011-03-29: qty 50

## 2011-03-29 MED ORDER — SODIUM CHLORIDE 0.9 % IV SOLN
INTRAVENOUS | Status: DC
  Administered 2011-03-29: 11:00:00 via INTRAVENOUS

## 2011-03-29 NOTE — ED Provider Notes (Signed)
History    This chart was scribed for Laray Anger, DO, MD by Smitty Pluck. The patient was seen in room APA12 and the patient's care was started at 8:33AM.   CSN: [id number]  Arrival date & time 03/29/11  0811   First MD Initiated Contact with Patient 03/29/11 0825      Chief Complaint  Patient presents with  . Nausea  . Vomiting  . Generalized Body Aches     The history is provided by the patient.   Ruth Duncan is a 41 y.o. female who presents to the Emergency Department complaining of gradual onset and persistence of multiple intermittent episodes of N/V/D that began yesterday.  Has been assoc with chills and generalized body aches/fatigue.  Denies fevers, no CP/SOB, no abd pain, no back pain, no black or blood in stools or emesis, no rash.   Past Medical History  Diagnosis Date  . Diverticulitis     Past Surgical History  Procedure Date  . Appendectomy   . Abdominal hysterectomy   . Cesarean section   . Colon resection     History  Substance Use Topics  . Smoking status: Current Everyday Smoker  . Smokeless tobacco: Not on file  . Alcohol Use: No    Review of Systems ROS: Statement: All systems negative except as marked or noted in the HPI; Constitutional: Negative for fever and +chills, generalized body aches/fatigue. ; ; Eyes: Negative for eye pain, redness and discharge. ; ; ENMT: Negative for ear pain, hoarseness, nasal congestion, sinus pressure and sore throat. ; ; Cardiovascular: Negative for chest pain, palpitations, diaphoresis, dyspnea and peripheral edema. ; ; Respiratory: Negative for cough, wheezing and stridor. ; ; Gastrointestinal: +N/V/D. Negative for abd pain, no blood in stool, hematemesis, jaundice and rectal bleeding. . ; ; Genitourinary: Negative for dysuria, flank pain and hematuria. ; ; Musculoskeletal: Negative for back pain and neck pain. Negative for swelling and trauma.; ; Skin: Negative for pruritus, rash, abrasions, blisters,  bruising and skin lesion.; ; Neuro: Negative for headache, lightheadedness and neck stiffness. Negative for weakness, altered level of consciousness , altered mental status, extremity weakness, paresthesias, involuntary movement, seizure and syncope.     Allergies  Review of patient's allergies indicates no known allergies.  Home Medications   Current Outpatient Rx  Name Route Sig Dispense Refill  . ATOMOXETINE HCL 40 MG PO CAPS Oral Take 40 mg by mouth daily.    [name] FLUOXETINE HCL 20 MG PO CAPS Oral Take 20 mg by mouth daily.    [name] LEVOBUNOLOL HCL 0.25 % OP SOLN  1 drop at bedtime.    [name] LEVOCETIRIZINE DIHYDROCHLORIDE 5 MG PO TABS Oral Take 5 mg by mouth every evening.    [name] PANTOPRAZOLE SODIUM 40 MG PO TBEC Oral Take 40 mg by mouth daily.      BP 98/60  Pulse 112  Temp 98 F (36.7 C)  Resp 20  Ht 5' 3.5" (1.613 m)  Wt 110 lb (49.896 kg)  BMI 19.18 kg/m2  Physical Exam 0835: Physical examination:  Nursing notes reviewed; Vital signs and O2 SAT reviewed;  Constitutional: Well developed, Well nourished, In no acute distress; Head:  Normocephalic, atraumatic; Eyes: EOMI, PERRL, No scleral icterus; ENMT: Mouth and pharynx normal, Mucous membranes dry; Neck: Supple, Full range of motion, No lymphadenopathy; Cardiovascular: Regular rate and rhythm, No murmur, rub, or gallop; Respiratory: Breath sounds clear & equal bilaterally, No rales, rhonchi, wheezes, or rub, Normal respiratory effort/excursion; Chest: Nontender,  Movement normal; Abdomen: Soft, +mild diffuse tenderness to palp, no rebound or guarding. Nondistended, Normal bowel sounds; Extremities: Pulses normal, No tenderness, No edema, No calf edema or asymmetry.; Neuro: AA&Ox3, Major CN grossly intact.  No gross focal motor or sensory deficits in extremities.; Skin: Color normal, Warm, Dry, no rash.    ED Course  Procedures     MDM  MDM Reviewed: previous chart, nursing note and vitals Reviewed previous: CT scan, labs and  x-ray Interpretation: labs and x-ray   Results for orders placed during the hospital encounter of 03/29/11  URINALYSIS, ROUTINE W REFLEX MICROSCOPIC      Component Value Range   Color, Urine YELLOW  YELLOW    APPearance CLEAR  CLEAR    Specific Gravity, Urine 1.025  1.005 - 1.030    pH 6.0  5.0 - 8.0    Glucose, UA 100 (*) NEGATIVE (mg/dL)   Hgb urine dipstick TRACE (*) NEGATIVE    Bilirubin Urine NEGATIVE  NEGATIVE    Ketones, ur NEGATIVE  NEGATIVE (mg/dL)   Protein, ur TRACE (*) NEGATIVE (mg/dL)   Urobilinogen, UA 0.2  0.0 - 1.0 (mg/dL)   Nitrite NEGATIVE  NEGATIVE    Leukocytes, UA NEGATIVE  NEGATIVE   CBC      Component Value Range   WBC 10.5  4.0 - 10.5 (K/uL)   RBC 4.66  3.87 - 5.11 (MIL/uL)   Hemoglobin 14.4  12.0 - 15.0 (g/dL)   HCT 16.1  09.6 - 04.5 (%)   MCV 88.8  78.0 - 100.0 (fL)   MCH 30.9  26.0 - 34.0 (pg)   MCHC 34.8  30.0 - 36.0 (g/dL)   RDW 40.9  81.1 - 91.4 (%)   Platelets 194  150 - 400 (K/uL)  DIFFERENTIAL      Component Value Range   Neutrophils Relative 93 (*) 43 - 77 (%)   Neutro Abs 9.7 (*) 1.7 - 7.7 (K/uL)   Lymphocytes Relative 4 (*) 12 - 46 (%)   Lymphs Abs 0.4 (*) 0.7 - 4.0 (K/uL)   Monocytes Relative 3  3 - 12 (%)   Monocytes Absolute 0.3  0.1 - 1.0 (K/uL)   Eosinophils Relative 1  0 - 5 (%)   Eosinophils Absolute 0.1  0.0 - 0.7 (K/uL)   Basophils Relative 0  0 - 1 (%)   Basophils Absolute 0.0  0.0 - 0.1 (K/uL)  COMPREHENSIVE METABOLIC PANEL      Component Value Range   Sodium 137  135 - 145 (mEq/L)   Potassium 4.0  3.5 - 5.1 (mEq/L)   Chloride 101  96 - 112 (mEq/L)   CO2 25  19 - 32 (mEq/L)   Glucose, Bld 139 (*) 70 - 99 (mg/dL)   BUN 19  6 - 23 (mg/dL)   Creatinine, Ser 7.82  0.50 - 1.10 (mg/dL)   Calcium 9.5  8.4 - 95.6 (mg/dL)   Total Protein 8.2  6.0 - 8.3 (g/dL)   Albumin 4.3  3.5 - 5.2 (g/dL)   AST 15  0 - 37 (U/L)   ALT 26  0 - 35 (U/L)   Alkaline Phosphatase 63  39 - 117 (U/L)   Total Bilirubin 0.8  0.3 - 1.2 (mg/dL)    GFR calc non Af Amer >90  >90 (mL/min)   GFR calc Af Amer >90  >90 (mL/min)  LIPASE, BLOOD      Component Value Range   Lipase 18  11 - 59 (U/L)  POCT  PREGNANCY, URINE      Component Value Range   Preg Test, Ur NEGATIVE  NEGATIVE   URINE MICROSCOPIC-ADD ON      Component Value Range   Squamous Epithelial / LPF FEW (*) RARE    RBC / HPF 0-2  <3 (RBC/hpf)   Dg Abd Acute W/chest 03/29/2011  *RADIOLOGY REPORT*  Clinical Data: Abdominal pain, nausea, vomiting and diarrhea.  ACUTE ABDOMEN SERIES (ABDOMEN 2 VIEW & CHEST 1 VIEW)  Comparison: CT scan 02/21/2011.  Findings: The upright chest x-ray is normal.  Two views of the abdomen demonstrate scattered air and stool in the colon.  Relative paucity of bowel gas otherwise.  No distended small bowel loops to suggest obstruction.  No free air.  The soft tissue shadows of the abdomen are maintained.  No worrisome calcifications.  The bony structures are intact.  IMPRESSION:  1.  Normal chest x-ray. 2.  No plain film findings for an acute abdominal process.  Original Report Authenticated By: P. Loralie Champagne, M.D.     1:43 PM:  Pt requested phenergan instead of zofran stating that "zofran doesn't work."  Has tol PO well in the ED without N/V after phenergan.  No stooling while in ED.  No specific area of point tenderness on abd exam and AXR neg for SBO; will hold CT A/P for now as pt just had CT A/P 1 month ago which was without acute process.  Labs and UA unremarkable today.  Wants to go home now.  Dx testing d/w pt.  Questions answered.  Verb understanding, agreeable to d/c home with outpt f/u.        I personally performed the services described in this documentation, which was scribed in my presence. The recorded information has been reviewed and considered. Marijean Montanye Allison Quarry, DO 03/30/11 [phone]

## 2011-03-29 NOTE — ED Notes (Addendum)
Pt presents to er with c/o multiple episoded of n/v/d, generalized body aches, chills, dizziness with standing and moving around.  Pt states that she started getting sick around 11pm,

## 2011-03-29 NOTE — Discharge Instructions (Signed)
RESOURCE GUIDE  Dental Problems  Patients with Medicaid: [facility]                     [facility] [address]                                           [address] Phone:  [phone]                                                  Phone:  [phone]  If unable to pay or uninsured, contact:  [facility] or [facility] to become qualified for the adult dental clinic.  Chronic Pain Problems Contact [facility]  [phone] Patients need to be referred by their primary care doctor.  Insufficient Money for Medicine Contact [employer]:  call "211" or [facility] 271-5999.  No Primary Care Doctor Call [facility]  [phone] Other agencies that provide inexpensive medical care    [facility]  [phone]    [facility]  [phone]    [facility]  [phone]    Women's Clinic  [phone]    [facility]  [phone]    [facility]  [phone]  Psychological Services [facility]  [phone] [facility]  [phone] [facility]   [phone] (emergency services [phone])  Substance Abuse Resources [facility]  336-882-2125 [facility] 336-784-9470 [facility] 336-285-9073 [facility] 336-845-3988 Residential & Outpatient Substance Abuse Program  800-659-3381  Abuse/Neglect [place] Child Abuse Hotline (336) 641-3795 [place] Child Abuse Hotline 800-378-5315 (After Hours)  Emergency Shelter [facility] (336) 271-5985  M[facility] Homes [facility] (336) 275-9566 [facility] (704) 372-4663  MRSA Hotline #:   832-7006    [place] [facility]  [facility] [place]     [employer]                          [facility] [address] [place]                       [address]      [address]  [place]                                                [place]                            [place] Phone:  [phone]                                   Phone:  [phone]                 Phone:  [phone]  [facility] Phone:  [phone]    [facility] Child Abuse Hotline 509-799-8321 8052736928 (After Hours)    Take the prescription as directed.  Take over the counter tylenol, as directed on packaging, as needed for discomfort.  Increase your fluid intake (ie:  Gatoraide) for the next few days.  Eat a bland diet and advance to your regular diet slowly as you can tolerate it.   Avoid full strength juices, as well as milk and milk products until your diarrhea has resolved.   Call your regular medical doctor today to schedule a follow up appointment this week.  Return to the Emergency Department immediately if not improving (or even worsening) despite taking the medicines as prescribed, any black or bloody stool or vomit, if you develop a fever ("101" or greater), or for any other concerns.

## 2011-03-29 NOTE — ED Notes (Signed)
Pt requesting phenergan notified EDP. Offered pt fluids, only drunk 2 sips.

## 2011-03-30 LAB — URINE CULTURE
Colony Count: NO GROWTH
Culture  Setup Time: 201303251025
Culture: NO GROWTH

## 2011-04-20 ENCOUNTER — Ambulatory Visit (INDEPENDENT_AMBULATORY_CARE_PROVIDER_SITE_OTHER): Payer: Medicare Other | Admitting: Physician Assistant

## 2011-04-20 DIAGNOSIS — F259 Schizoaffective disorder, unspecified: Secondary | ICD-10-CM

## 2011-04-21 ENCOUNTER — Other Ambulatory Visit (HOSPITAL_COMMUNITY): Payer: Self-pay | Admitting: Physician Assistant

## 2011-04-21 MED ORDER — FLUOXETINE HCL 20 MG PO CAPS: 20.0000 mg | ORAL_CAPSULE | Freq: Every day | ORAL | Status: DC

## 2011-04-21 MED ORDER — PERPHENAZINE 8 MG PO TABS: 8.0000 mg | ORAL_TABLET | Freq: Two times a day (BID) | ORAL | Status: DC

## 2011-05-04 ENCOUNTER — Encounter (HOSPITAL_COMMUNITY): Payer: Self-pay | Admitting: Physician Assistant

## 2011-05-04 NOTE — Progress Notes (Signed)
Psychiatric Assessment Adult  Patient Identification:  Ruth Duncan Date of Evaluation:  04/20/2011 Chief Complaint: Schizoaffective disorder, Cocaine dependence in remission History of Chief Complaint:   Chief Complaint  Patient presents with  . Addiction Problem  . Schizophrenia  . Establish Care    HPI [name] is familiar to this provider as I oversaw her medical care while she was admitted to the [facility]. She happily reports that she is now 10 months clean and she continues to attend 12-step meetings 3 days a week, has a new sponsor and is working through the 12 steps again. She reports that she feels better and is enjoying life. She has experienced several deaths in the past few months, but has not had any thought of using substances as a coping mechanism.  She has a history of schizoaffective disorder, and reports that she has auditory hallucinations on "rare occasions." She does endorse anxiety about someone outside of her home. Her sleep is poor, but, as she states, she survives. She endorses that she continues to have racing thoughts, and has trouble at times distinguishing between fantasy and reality.  Brenee endorses many symptoms of ADHD including difficulty concentrating, difficulty completing tasks without moving on to another task, losing items such as her keys, phone, lighter, disorganization, multitasking, impulsivity, and interrupting others.  Review of Systems Physical Exam [name] is a well-nourished well-developed Asian female in no acute distress who presents as alert and oriented to person place time and event. She appears appropriate, if not younger, than her stated age of 41.  Depressive Symptoms: None currently  (Hypo) Manic Symptoms:   Elevated Mood:  No Irritable Mood:  Yes Grandiosity:  No Distractibility:  Yes Labiality of Mood:  Yes Delusions:  Yes Hallucinations:  Yes Impulsivity:  Yes Sexually  Inappropriate Behavior:  No Financial Extravagance:  No Flight of Ideas:  No  Anxiety Symptoms: Excessive Worry:  No Panic Symptoms:  Yes Agoraphobia:  No Obsessive Compulsive: No  Symptoms: None, Specific Phobias:  No Social Anxiety:  No  Psychotic Symptoms:  Hallucinations: Yes Auditory Delusions:  Yes Paranoia:  Yes   Ideas of Reference:  No  PTSD Symptoms: Ever had a traumatic exposure:  Yes Had a traumatic exposure in the last month:  No Re-experiencing: Yes Intrusive Thoughts Hypervigilance:  Yes Hyperarousal: Yes Difficulty Concentrating Increased Startle Response Avoidance: No None  Traumatic Brain Injury: No   Past Psychiatric History: Diagnosis: Schizoaffective disorder, cocaine dependence in remission   Hospitalizations: [facility] in 2007  Outpatient Care: Psychiatry, psychotherapy, and intensive outpatient   Substance Abuse Care: Inpatient and outpatient care   Self-Mutilation: None   Suicidal Attempts: Thoughts only   Violent Behaviors: None    Past Medical History:   Past Medical History  Diagnosis Date  . Diverticulitis   . Colon cancer 2011  . Ovarian cancer 2010  . Surgical menopause 2010   History of Loss of Consciousness:  No Seizure History:  No Cardiac History:  No Allergies:  No Known Allergies Current Medications:  Current Outpatient Prescriptions  Medication Sig Dispense Refill  . atomoxetine (STRATTERA) 40 MG capsule Take 40 mg by mouth daily.      [name] FLUoxetine (PROZAC) 20 MG capsule Take 1 capsule (20 mg total) by mouth daily.  30 capsule  0  . levobunolol (BETAGAN) 0.25 % ophthalmic solution 1 drop at bedtime.      [name] levocetirizine (XYZAL) 5 MG tablet Take 5 mg by  mouth every evening.      . pantoprazole (PROTONIX) 40 MG tablet Take 40 mg by mouth daily.      [name] perphenazine (TRILAFON) 8 MG tablet Take 1 tablet (8 mg total) by mouth 2 (two) times daily.  60 tablet  0    Substance Abuse History in the last 12  months: Shaleka has a history significant for substance abuse, most specifically cocaine. She has been treated in residential as well as outpatient facilities. She is currently in remission for 10 months.  Medical Consequences of Substance Abuse: None  Legal Consequences of Substance Abuse: None  Family Consequences of Substance Abuse: Disappointment and mistrust  Social History: Ruth Duncan was born and grew up in [place], [place]. Her parents divorced when she was 36 years of age, and she grew up with her paternal grandmother and grandfather. Her grandfather was an alcoholic, and Tenika found him dead of an MI at the age of 1. Her mother used to lock her in a dark bathroom for an hour or so very frequently, and Roniesha continues to have phobias associated with opening a bathroom door. Ruth Duncan graduated from [employer], but has had no real career because she was unable to hold a job. She has been married for 22 years and has 3 boys, twins age 92 and her oldest son age 63. 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 She is currently attending Alcoholics Anonymous meetings 3 times a week, has a sponsor, and is working the 12 steps.  Family History:   Family History  Problem Relation Age of Onset  . Alcohol abuse Paternal Grandfather     Mental Status Examination/Evaluation: Objective:  Appearance: Casual, Neat and Well Groomed  Eye Contact::  Good  Speech:  Clear and Coherent  Volume:  Normal  Mood:  Euthymic   Affect:  Appropriate  Thought Process:  Goal Directed and Linear  Orientation:  Full  Thought Content:  Delusions and Hallucinations: Auditory  Suicidal Thoughts:  No  Homicidal Thoughts:  No  Judgement:  Good  Insight:  Good  Psychomotor Activity:  Normal  Akathisia:  No  Handed:    AIMS (if indicated):    Assets:  Communication Skills Desire for Improvement Financial Resources/Insurance Housing Intimacy Leisure Time Physical  Health Resilience Social Support Transportation    Laboratory/X-Ray Psychological Evaluation(s)        Assessment:    AXIS I Schizoaffective Disorder and Substance Abuse  AXIS II Deferred  AXIS III Past Medical History  Diagnosis Date  . Diverticulitis   . Colon cancer 2011  . Ovarian cancer 2010  . Surgical menopause 2010     AXIS IV occupational problems and problems related to legal system/crime  AXIS V 51-60 moderate symptoms   Treatment Plan/Recommendations:  Plan of Care: Continue patient on Prozac and perphenazine   Laboratory:    Psychotherapy:   Medications: Prozac 20 mg daily, and perphenazine 8 mg daily   Routine PRN Medications:  No  Consultations:   Safety Concerns:  Relapse prevention   Other:      Skyley Grandmaison, PA-C 4/30/201311:42 AM

## 2011-05-26 ENCOUNTER — Encounter (HOSPITAL_COMMUNITY): Payer: Self-pay | Admitting: Physician Assistant

## 2011-05-26 ENCOUNTER — Ambulatory Visit (INDEPENDENT_AMBULATORY_CARE_PROVIDER_SITE_OTHER): Payer: Medicare Other | Admitting: Physician Assistant

## 2011-05-26 DIAGNOSIS — F259 Schizoaffective disorder, unspecified: Secondary | ICD-10-CM | POA: Insufficient documentation

## 2011-05-26 DIAGNOSIS — F1421 Cocaine dependence, in remission: Secondary | ICD-10-CM

## 2011-05-26 DIAGNOSIS — F410 Panic disorder [episodic paroxysmal anxiety] without agoraphobia: Secondary | ICD-10-CM

## 2011-05-26 MED ORDER — HYDROXYZINE PAMOATE 25 MG PO CAPS: 25.0000 mg | ORAL_CAPSULE | Freq: Three times a day (TID) | ORAL | Status: AC | PRN

## 2011-05-26 MED ORDER — PERPHENAZINE 8 MG PO TABS: 8.0000 mg | ORAL_TABLET | Freq: Two times a day (BID) | ORAL | Status: DC

## 2011-05-26 MED ORDER — FLUOXETINE HCL 40 MG PO CAPS: 40.0000 mg | ORAL_CAPSULE | Freq: Every day | ORAL | Status: DC

## 2011-05-26 NOTE — Progress Notes (Signed)
   [facility]al Health Follow-up Outpatient Visit  Ruth Duncan 1970-03-31  Date: 05/26/2011   Subjective: Ruth Duncan presents today to followup on medications prescribed for her schizoaffective disorder and anxiety. She happily announces that she will have been abstinent from substances of abuse for one year this coming June 6. She has completed her jail time for charges incurred while she was using. She has gotten involved with her church and is very active there, and is becoming a Architectural technologist. She continues to attend 12-step meetings. She complains that she has had an increase in anxiety that stems from spending weekends in jail isolated. She is having panic attacks 2-3 times daily that present with shortness of breath and chest pain and last about 30 minutes. Afterward she feels exhausted. She reports that she has improved her communication with her mother, and they are talking on the telephone to 3 times a week, and they even go out to lunch together on occasion. She states she is having no expectations on that relationship. She reports the Intuniv samples she was given at her last visit caused her to have headaches and nausea and vomiting. She complains of poor sleep and decreased appetite. She denies any suicidal or homicidal ideation. She reports that her auditory hallucinations have the diminished.  There were no vitals filed for this visit.  Mental Status Examination  Appearance: Well groomed and neatly dressed Alert: Yes Attention: good  Cooperative: Yes Eye Contact: Good Speech: Clear and coherent Psychomotor Activity: Normal Memory/Concentration: Intact Oriented: person, place, time/date and situation Mood: Anxious and Euthymic, labile Affect: Full Range Thought Processes and Associations: Circumstantial, Goal Directed and Logical Fund of Knowledge: Good Thought Content: Auditory hallucinations Insight: Good Judgement: Good  Diagnosis: Schizoaffective disorder, panic  disorder, cocaine dependence in remission, new onset of acute traumatic disorder.  Treatment Plan: We'll increase her Prozac to 40 mg daily, and continue her Trileptal on at 8 mg twice daily. To target the panic attacks, we will allow her to have Vistaril 25 mg up to 3 times daily as needed. She is also given permission to use Vistaril as a sleep aid if necessary. We will refer her to see a therapist in our [place] office contingent on availability. She will return for followup in approximately 4-6 weeks as appointments are available.  Cathlene Gardella, PA-C

## 2011-06-22 ENCOUNTER — Ambulatory Visit (INDEPENDENT_AMBULATORY_CARE_PROVIDER_SITE_OTHER): Payer: Medicare Other | Admitting: Physician Assistant

## 2011-06-22 DIAGNOSIS — F1421 Cocaine dependence, in remission: Secondary | ICD-10-CM

## 2011-06-22 DIAGNOSIS — F410 Panic disorder [episodic paroxysmal anxiety] without agoraphobia: Secondary | ICD-10-CM

## 2011-06-22 DIAGNOSIS — F259 Schizoaffective disorder, unspecified: Secondary | ICD-10-CM

## 2011-06-22 MED ORDER — HALOPERIDOL 2 MG PO TABS: ORAL_TABLET | ORAL | Status: DC

## 2011-06-22 NOTE — Progress Notes (Signed)
   [facility] Behavioral Health Follow-up Outpatient Visit  Ruth Duncan 07/21/1970  Date: 06/22/2011  Subjective:  [name] presents today to followup on her treatment of schizoaffective disorder, anxiety, and history of cocaine dependence. She reports that she is doing somewhat better, and that her anxiety is less. She has been taking the Vistaril tablet 1 tablet daily 3-4 times weekly as needed. She continues to complain that she is not sleeping do to her busy mind. She also complains that she is having trouble finishing projects. She reports that her auditory hallucinations are all but gone. She continues to attend regular 12-step meetings, has gotten involved with her church, and is now participating with an Educational psychologist. She is also busy this summer taking her kids to the water park frequently.   There were no vitals filed for this visit.  Mental Status Examination  Appearance: Well groomed and nicely dressed Alert: Yes Attention: good  Cooperative: Yes Eye Contact: Good Speech: Clear and coherent Psychomotor Activity: Normal Memory/Concentration: Intact Oriented: person, place, time/date and situation Mood: Euthymic Affect: Appropriate Thought Processes and Associations: Goal Directed and Linear Fund of Knowledge: Good Thought Content: Normal Insight: Good Judgement: Good  Diagnosis: Schizoaffective disorder, panic disorder, cocaine dependence in remission  Treatment Plan: To target her insomnia we will try Haldol 2 mg at bedtime. If she does not respond to milligrams she has been given permission to take 4 mg, or up to 6 mg to help her sleep. She has been advised of potential side effects, and has been instructed to call if she experiences any problems. We discussed the possibility of starting her on Daytrana methylphenidate patch at some point in the future, but we should try Strattera first. She will return for followup in 6 weeks.  Kadarius Cuffe, PA-C

## 2011-06-28 ENCOUNTER — Ambulatory Visit (HOSPITAL_COMMUNITY): Payer: Self-pay | Admitting: Psychology

## 2011-08-04 ENCOUNTER — Ambulatory Visit (HOSPITAL_COMMUNITY): Payer: Self-pay | Admitting: Physician Assistant

## 2011-08-17 ENCOUNTER — Other Ambulatory Visit (HOSPITAL_COMMUNITY): Payer: Self-pay | Admitting: Physician Assistant

## 2011-08-30 ENCOUNTER — Other Ambulatory Visit (HOSPITAL_COMMUNITY): Payer: Self-pay | Admitting: *Deleted

## 2011-08-30 MED ORDER — PERPHENAZINE 8 MG PO TABS: 8.0000 mg | ORAL_TABLET | Freq: Two times a day (BID) | ORAL | Status: DC

## 2011-08-30 MED ORDER — HALOPERIDOL 2 MG PO TABS: ORAL_TABLET | ORAL | Status: DC

## 2011-08-30 NOTE — Telephone Encounter (Signed)
VM:Had to reschedule last appt, due to kids being sick. Now has appt in October, but needs refills on these meds. Requests refills of Haldol and Trilafon. Also, having migraines again, and needs a RX for Topamax. She can't remember if you are the provider who gave it to her last time.

## 2011-09-14 ENCOUNTER — Other Ambulatory Visit: Payer: Self-pay | Admitting: Family Medicine

## 2011-09-14 DIAGNOSIS — R1011 Right upper quadrant pain: Secondary | ICD-10-CM

## 2011-09-14 LAB — CBC
HCT: 41 %
Hemoglobin: 14 g/dL (ref 12.0–16.0)
MCH: 32.4
MCHC: 34
MCV: 95.1 fL
RBC: 4.34
RDW: 13.6
WBC: 8.6
platelet count: 197

## 2011-09-16 ENCOUNTER — Other Ambulatory Visit: Payer: Self-pay

## 2011-09-17 ENCOUNTER — Encounter: Payer: Self-pay | Admitting: Gastroenterology

## 2011-09-20 ENCOUNTER — Ambulatory Visit (INDEPENDENT_AMBULATORY_CARE_PROVIDER_SITE_OTHER): Payer: Medicare Other | Admitting: Gastroenterology

## 2011-09-20 ENCOUNTER — Encounter: Payer: Self-pay | Admitting: Gastroenterology

## 2011-09-20 VITALS — BP 103/69 | HR 73 | Temp 97.2°F | Ht 63.0 in | Wt 112.4 lb

## 2011-09-20 DIAGNOSIS — R197 Diarrhea, unspecified: Secondary | ICD-10-CM

## 2011-09-20 DIAGNOSIS — R1013 Epigastric pain: Secondary | ICD-10-CM

## 2011-09-20 LAB — LIPASE: Lipase: 45 U/L (ref 11–59)

## 2011-09-20 MED ORDER — ONDANSETRON 4 MG PO TBDP: 4.0000 mg | ORAL_TABLET | Freq: Three times a day (TID) | ORAL | Status: AC | PRN

## 2011-09-20 MED ORDER — PANTOPRAZOLE SODIUM 40 MG PO TBEC: 40.0000 mg | DELAYED_RELEASE_TABLET | Freq: Two times a day (BID) | ORAL | Status: DC

## 2011-09-20 NOTE — Progress Notes (Signed)
Primary Care Physician:  Warrick Parisian, MD Primary Gastroenterologist: Dr. Darrick Penna   Chief Complaint  Patient presents with  . Diarrhea  . Abdominal Pain  . Emesis    HPI:   41 year old female who was last seen by our office in Feb 2011, hx of IBS-D, recurrent diverticulitis requiring colectomy in 2011, chronic abdominal pain. Notes 2 weeks of epigastric pain, N/V. +epigastric pain with eating, belching, burping.  Pain so bad the other night, felt like couldn't breathe. +diarrhea, "can't eat, starving". Feels like acid coming out of her. If passes gas, may "crap her pants". Green. Watery. 15 times per day. Postprandial. If eats a goldfish cracker, "throw up or diarrhea".  Wt loss, about 3 lbs. No hematemesis. +indigestion. On Protonix. Not helping now, but has helped in past.  No NSAIDs. Was on some abx for sinus infection several weeks ago. No sick contacts. Well water. Lower abdominal soreness. No changes to medication.   Prior to all this, would have BM once per day.   Past Medical History  Diagnosis Date  . Diverticulitis   . Tubular adenoma 2010  . Ovarian cancer 2010  . Surgical menopause 2010  . Bipolar disorder   . Chronic abdominal pain     hx of IBS-D, ? bile salt-induced diarrhea    Past Surgical History  Procedure Date  . Appendectomy 2004  . Abdominal hysterectomy 2010  . Cesarean section 1998  . Subtotal colectomy 2011  . Esophagogastroduodenoscopy 05/06/2008    Normal esophagus without evidence of Barrett's, mass, erosion, ulceration or stricture/ Hiatal hernia/ Normal duodenal bulb and second portion of the duodenum  . Tubal ligation   . Colonoscopy 12/2008    tubular adenomas, negative microscopic colitis. Due for Flex Sig Dec 2015    Current Outpatient Prescriptions  Medication Sig Dispense Refill  . FLUoxetine (PROZAC) 40 MG capsule TAKE 1 CAPSULE (40 MG TOTAL) BY MOUTH DAILY.  30 capsule  1  . haloperidol (HALDOL) 2 MG tablet Take 1 to 2 tablets at  bedtime for sleep as needed.  60 tablet  1  . levobunolol (BETAGAN) 0.25 % ophthalmic solution 1 drop at bedtime.      [name] levocetirizine (XYZAL) 5 MG tablet Take 5 mg by mouth every evening.      . pantoprazole (PROTONIX) 40 MG tablet Take 1 tablet (40 mg total) by mouth 2 (two) times daily.  60 tablet  1  . perphenazine (TRILAFON) 8 MG tablet Take 1 tablet (8 mg total) by mouth 2 (two) times daily.  60 tablet  1  . topiramate (TOPAMAX) 25 MG tablet Take 25 mg by mouth daily.      [name] atomoxetine (STRATTERA) 40 MG capsule Take 40 mg by mouth daily.      . ondansetron (ZOFRAN ODT) 4 MG disintegrating tablet Take 1 tablet (4 mg total) by mouth every 8 (eight) hours as needed for nausea.  30 tablet  0    Allergies as of 09/20/2011  . (No Known Allergies)    Family History  Problem Relation Age of Onset  . Alcohol abuse Paternal Grandfather   . Colon cancer Neg Hx     History   Social History  . Marital Status: Married    Spouse Name: N/A    Number of Children: N/A  . Years of Education: N/A   Occupational History  . Disability     since 2010   Social History Main Topics  . Smoking status: Current Every Day Smoker -- 1.0  packs/day for 30 years  . Smokeless tobacco: None  . Alcohol Use: No  . Drug Use: No  . Sexually Active: Yes -- Female partner(s)   Other Topics Concern  . None   Social History Narrative   Kani was born and grew up in [place], [place]. Her parents divorced when she was 17 years of age, and she grew up with her paternal grandmother and grandfather. Her grandfather was an alcoholic, and Isabella found him dead of an MI at the age of 68. Her mother used to lock her in a dark bathroom for an hour or so very frequently, and Margi continues to have phobias associated with opening a bathroom door. Ammara graduated from [employer], but has had no real career because she was unable to hold a job. She has been married for 22 years and has 3 boys, twins age 107 and her  oldest son age 61. 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 She is currently attending Alcoholics Anonymous meetings 3 times a week, has a sponsor, and is working the 12 steps.    Review of Systems: Gen: SEE HPI CV: Denies chest pain, palpitations, syncope, peripheral edema, and claudication. Resp: Denies dyspnea at rest, cough, wheezing, coughing up blood, and pleurisy. GI: SEE HPI Derm: Denies rash, itching, dry skin Psych: Denies depression, anxiety, memory loss, confusion. No homicidal or suicidal ideation.  Heme: Denies bruising, bleeding, and enlarged lymph nodes.  Physical Exam: BP 103/69  Pulse 73  Temp 97.2 F (36.2 C) (Temporal)  Ht 5\' 3"  (1.6 m)  Wt 112 lb 6.4 oz (50.984 kg)  BMI 19.91 kg/m2 General:   Alert and oriented. No distress noted. Pleasant and cooperative.  Head:  Normocephalic and atraumatic. Eyes:  Conjuctiva clear without scleral icterus. Mouth:  Oral mucosa pink and moist. Good dentition. No lesions. Neck:  Supple, without mass or thyromegaly. Heart:  S1, S2 present without murmurs, rubs, or gallops. Regular rate and rhythm. Abdomen:  +BS, soft, +TTP epigastric, lower abdomen, RUQ, out of proportion to palpation. Barely touching patient causes her to wince. non-distended. No HSM or masses noted. Msk:  Symmetrical without gross deformities. Normal posture. Extremities:  Without edema. Neurologic:  Alert and  oriented x4;  grossly normal neurologically. Skin:  Intact without significant lesions or rashes. Cervical Nodes:  No significant cervical adenopathy. Psych:  Alert and cooperative. Normal mood and affect.   Outside labs: Sept 2013 No leukocytosis, anemia [place] of abd normal, no stones

## 2011-09-20 NOTE — Patient Instructions (Addendum)
Please have blood work done and complete stool studies. We will call you as soon as the results are available.  Increase Protonix to twice a day. I have sent a prescription for Zofran to your pharmacy.  You may take Kaopectate as needed. We may need to look at your colon and possibly an upper endoscopy in near future if stool studies, blood work, and ultrasound are negative.

## 2011-09-21 ENCOUNTER — Encounter: Payer: Self-pay | Admitting: Gastroenterology

## 2011-09-21 DIAGNOSIS — R1013 Epigastric pain: Secondary | ICD-10-CM | POA: Insufficient documentation

## 2011-09-21 NOTE — Progress Notes (Signed)
Faxed to PCP

## 2011-09-21 NOTE — Assessment & Plan Note (Signed)
41 year old female with acute onset of epigastric pain, N/V, increased indigestion, burping and belching X 2 weeks. Notes associated profuse watery diarrhea, up to 15 times per day. No rectal bleeding. Notes occasional incontinence. No hematemesis, use of NSAIDs, changes in medication. Exposure to abx noted in recent past. CBC normal, [place] of abdomen normal without stones. Palpation of abdomen difficult, as patient winced prior to even being touched. However, she does not appear acutely ill. No melena, rectal bleeding. No hematemesis. Last EGD in 2010 with hiatal hernia. Last TCS in 2010. As of note, her weight is up 6 lbs from last visit in 2011.   Differentials vast at this point; will order stat lipase. Need stool studies to assess for Cdiff, include culture, giardia, lactoferrin. Consider flex sig if profuse diarrhea continues without etiology. Increase Protonix to BID for now. Bland diet, kaopectate for now. Discussed need for possible upper EGD if all else negative. Further recommendations in near future.

## 2011-09-22 NOTE — Progress Notes (Signed)
Quick Note:  LMOM to call. ______ 

## 2011-09-23 NOTE — Progress Notes (Signed)
Quick Note:  Pt returned call and was informed. She will try to get the stool samples soon. ______

## 2011-09-28 ENCOUNTER — Ambulatory Visit (HOSPITAL_COMMUNITY): Payer: Self-pay | Admitting: Physician Assistant

## 2011-10-16 ENCOUNTER — Other Ambulatory Visit (HOSPITAL_COMMUNITY): Payer: Self-pay | Admitting: Physician Assistant

## 2011-10-21 NOTE — Progress Notes (Signed)
Pt had near total colectomy. HAS ILEO-COLIC ANASTOMOSIS-DISTAL [place]om[place] AND RECTUM. PT NEEDS A LOW FAT DIET. EAT 2 TUMS WITH MEALS TID.(NO MORE THAN 6 PER DAY). OPV W/ [facility] WITHIN THE NEXT MONTH, E 30 DX: DIARRHEA.  REVIEWED.

## 2011-10-21 NOTE — Progress Notes (Signed)
REVIEWED.  

## 2011-11-02 NOTE — Progress Notes (Signed)
See above recommendations per SLF. Needs low-fat diet, eat 2 tums TID with meals, no more than 6 per day.   Return in 1 month with [facility], 30 min slot.  Still has not completed stool studies.

## 2011-11-02 NOTE — Progress Notes (Signed)
Called, [facility] for a return call. Low fat diet mailed.

## 2011-11-17 ENCOUNTER — Encounter: Payer: Self-pay | Admitting: Gastroenterology

## 2011-11-17 NOTE — Progress Notes (Signed)
Pt is aware of OV on 12/08/11 @ 0930 with SF in E30 and appt card was mailed

## 2011-11-18 ENCOUNTER — Ambulatory Visit (INDEPENDENT_AMBULATORY_CARE_PROVIDER_SITE_OTHER): Payer: Medicare Other | Admitting: Physician Assistant

## 2011-11-18 DIAGNOSIS — F411 Generalized anxiety disorder: Secondary | ICD-10-CM

## 2011-11-18 DIAGNOSIS — F259 Schizoaffective disorder, unspecified: Secondary | ICD-10-CM

## 2011-11-18 DIAGNOSIS — F251 Schizoaffective disorder, depressive type: Secondary | ICD-10-CM | POA: Insufficient documentation

## 2011-11-18 MED ORDER — PERPHENAZINE 8 MG PO TABS: 8.0000 mg | ORAL_TABLET | Freq: Two times a day (BID) | ORAL | Status: DC

## 2011-11-18 MED ORDER — ESCITALOPRAM OXALATE 10 MG PO TABS: ORAL_TABLET | ORAL | Status: DC

## 2011-11-18 NOTE — Progress Notes (Signed)
   [facility] Behavioral Health Follow-up Outpatient Visit  Ruth Duncan May 04, 1970  Date: 11/18/2011   Subjective: [name] presents today to followup on her treatment for schizoaffective disorder and anxiety. She happily announces that she has gotten off of probation 2 years early because she successfully completed the intensive outpatient program here, and has remained clean. She also reports that her relationship with her mother continues to improve. She does endorse some increased anxiety and depression, especially with the expectation of the upcoming holidays. She would like to resume Lexapro. She complains of having racing thoughts and not being able to be productive while trying to multitask. She also is having anxiety concerning her father going on dialysis. She is staying very involved with multiple church activities, and continues to attend 12-step meetings twice a week. She denies any suicidal or homicidal ideation. She denies any auditory or visual hallucinations.  There were no vitals filed for this visit.  Mental Status Examination  Appearance: Well groomed and casually dressed Alert: Yes Attention: good  Cooperative: Yes Eye Contact: Good Speech: Clear and coherent Psychomotor Activity: Normal Memory/Concentration: Intact Oriented: person, place, time/date and situation Mood: Anxious Affect: Congruent Thought Processes and Associations: Linear Fund of Knowledge: Good Thought Content: Normal Insight: Fair Judgement: Good  Diagnosis: Schizoaffective disorder, generalized anxiety disorder.  Treatment Plan: We will change her Prozac to Lexapro 10 mg daily, and continue her perphenazine 8 mg twice daily. As she no longer takes Haldol and Strattera we are discontinuing those due to poor response. She has been instructed to try to back off of some of her responsibilities, and do a better job of self care. In other words, she needs to find balance. She will return for followup in 2  months. At that time we may consider other medications for the racing thoughts.  Sherrie Marsan, PA-C

## 2011-12-08 ENCOUNTER — Ambulatory Visit: Payer: Self-pay | Admitting: Gastroenterology

## 2011-12-15 ENCOUNTER — Other Ambulatory Visit (HOSPITAL_COMMUNITY): Payer: Self-pay | Admitting: Physician Assistant

## 2012-01-04 ENCOUNTER — Encounter: Payer: Self-pay | Admitting: Gastroenterology

## 2012-01-06 ENCOUNTER — Encounter: Payer: Self-pay | Admitting: Gastroenterology

## 2012-01-06 ENCOUNTER — Other Ambulatory Visit: Payer: Self-pay | Admitting: Gastroenterology

## 2012-01-06 ENCOUNTER — Ambulatory Visit (INDEPENDENT_AMBULATORY_CARE_PROVIDER_SITE_OTHER): Payer: Medicare Other | Admitting: Gastroenterology

## 2012-01-06 VITALS — BP 100/61 | HR 65 | Temp 97.2°F | Ht 63.0 in | Wt 114.2 lb

## 2012-01-06 DIAGNOSIS — R109 Unspecified abdominal pain: Secondary | ICD-10-CM

## 2012-01-06 DIAGNOSIS — K219 Gastro-esophageal reflux disease without esophagitis: Secondary | ICD-10-CM

## 2012-01-06 DIAGNOSIS — R197 Diarrhea, unspecified: Secondary | ICD-10-CM

## 2012-01-06 MED ORDER — DICYCLOMINE HCL 10 MG PO CAPS: ORAL_CAPSULE | ORAL | Status: DC

## 2012-01-06 MED ORDER — COLESTIPOL HCL 1 G PO TABS: ORAL_TABLET | ORAL | Status: DC

## 2012-01-06 NOTE — Assessment & Plan Note (Addendum)
CHRONIC INTERMITTENT DIARRHEA MOST LIKELY DUE TO BILE SALT DIARRHEA, IBS, OR SMALL BOWEL BACTERIAL OVERGROWTH BECAUSE PT HAS HAD A SUBTOTAL COLECTOMY. SX LESS LIKELY DUE TO INFECTION.   START A PROBIOTIC DAILY(ALIGN, PHILLIP'S COLON HEATH OR [employer] GENERIC BRAND).  START COLESTID TWICE DAILY BEFORE BREAKFAST AND SUPPER. MED WARNINGS GIVEN.  START BENTYL 10 MG 30 MINUTES PRIOR TO BREAKFAST AND LUNCH. MAY CAUSE DRY MOUTH/EYES, DROWSINESS, DIFFICULTY URINATING, OR BLURRY VISION.  SUBMIT STOOL STUDIES.  WILL SCHEDULE HYDROGEN BREATH TEST TO CHECK FOR SMALL BOWEL BACTERIAL OVERGROWTH AFTER JAN 10.  FOLLOW UP IN 6 WEEKS.

## 2012-01-06 NOTE — Assessment & Plan Note (Signed)
DUE TO DIETARY NON-ADHERENCE. NO WARNING S/Sx.  FOLLOW A LOW FAT DIET. SEE INFO BELOW. NO FRIED FOODS FOR THE NEXT 6 WEEKS. THIS WILL IMPROVE YOUR DIARRHEA AND HELP CONTROL YOUR REFLUX.  CONTINUE PROTONIX. TAKE 30 MINUTES PRIOR TO MEALS TWICE DAILY.  OPV IN 6 WEEKS.

## 2012-01-06 NOTE — Patient Instructions (Addendum)
FOLLOW A LOW FAT DIET. SEE INFO BELOW. NO FRIED FOODS FOR THE NEXT 6 WEEKS. THIS WILL IMPROVE YOUR DIARRHEA AND HELP CONTROL YOUR REFLUX.  CONTINUE PROTONIX. TAKE 30 MINUTES PRIOR TO MEALS TWICE DAILY.  START A PROBIOTIC DAILY(ALIGN, PHILLIP'S COLON HEATH OR [employer] GENERIC BRAND).  START COLESTID TWICE DAILY BEFORE BREAKFAST AND SUPPER.  DRINK WATER TO KEEP URINE LIGHT YELLOW.  START BENTYL 10 MG 30 MINUTES PRIOR TO BREAKFAST AND LUNCH. MAY CAUSE DRY MOUTH/EYES, DROWSINESS, DIFFICULTY URINATING, OR BLURRY VISION.  SUBMIT STOOL STUDIES.  WILL SCHEDULE HYDROGEN BREATH TEST TO CHECK FOR SMALL BOWEL BACTERIAL OVERGROWTH AFTER JAN 10.  FOLLOW UP IN 6 WEEKS.   Low-Fat Diet BREADS, CEREALS, PASTA, RICE, DRIED PEAS, AND BEANS These products are high in carbohydrates and most are low in fat. Therefore, they can be increased in the diet as substitutes for fatty foods. They too, however, contain calories and should not be eaten in excess. Cereals can be eaten for snacks as well as for breakfast.   FRUITS AND VEGETABLES It is good to eat fruits and vegetables. Besides being sources of fiber, both are rich in vitamins and some minerals. They help you get the daily allowances of these nutrients. Fruits and vegetables can be used for snacks and desserts.  MEATS Limit lean meat, chicken, [place], and fish to no more than 6 ounces per day. Beef, Pork, and Lamb Use lean cuts of beef, pork, and lamb. Lean cuts include:  Extra-lean ground beef.  Arm roast.  Sirloin tip.  Center-cut ham.  Round steak.  Loin chops.  Rump roast.  Tenderloin.  Trim all fat off the outside of meats before cooking. It is not necessary to severely decrease the intake of red meat, but lean choices should be made. Lean meat is rich in protein and contains a highly absorbable form of iron. Premenopausal women, in particular, should avoid reducing lean red meat because this could increase the risk for low red blood cells  (iron-deficiency anemia).  Chicken and [place] These are good sources of protein. The fat of poultry can be reduced by removing the skin and underlying fat layers before cooking. Chicken and [place] can be substituted for lean red meat in the diet. Poultry should not be fried or covered with high-fat sauces. Fish and Shellfish Fish is a good source of protein. Shellfish contain cholesterol, but they usually are low in saturated fatty acids. The preparation of fish is important. Like chicken and [place], they should not be fried or covered with high-fat sauces. EGGS Egg whites contain no fat or cholesterol. They can be eaten often. Try 1 to 2 egg whites instead of whole eggs in recipes or use egg substitutes that do not contain yolk. MILK AND DAIRY PRODUCTS Use skim or 1% milk instead of 2% or whole milk. Decrease whole milk, natural, and processed cheeses. Use nonfat or low-fat (2%) cottage cheese or low-fat cheeses made from vegetable oils. Choose nonfat or low-fat (1 to 2%) yogurt. Experiment with evaporated skim milk in recipes that call for heavy cream. Substitute low-fat yogurt or low-fat cottage cheese for sour cream in dips and salad dressings. Have at least 2 servings of low-fat dairy products, such as 2 glasses of skim (or 1%) milk each day to help get your daily calcium intake. FATS AND OILS Reduce the total intake of fats, especially saturated fat. Butterfat, lard, and beef fats are high in saturated fat and cholesterol. These should be avoided as much as possible. Vegetable  fats do not contain cholesterol, but certain vegetable fats, such as coconut oil, palm oil, and palm kernel oil are very high in saturated fats. These should be limited. These fats are often used in bakery goods, processed foods, popcorn, oils, and nondairy creamers. Vegetable shortenings and some peanut butters contain hydrogenated oils, which are also saturated fats. Read the labels on these foods and check for saturated  vegetable oils. Unsaturated vegetable oils and fats do not raise blood cholesterol. However, they should be limited because they are fats and are high in calories. Total fat should still be limited to 30% of your daily caloric intake. Desirable liquid vegetable oils are corn oil, cottonseed oil, olive oil, canola oil, safflower oil, soybean oil, and sunflower oil. Peanut oil is not as good, but small amounts are acceptable. Buy a heart-healthy tub margarine that has no partially hydrogenated oils in the ingredients. Mayonnaise and salad dressings often are made from unsaturated fats, but they should also be limited because of their high calorie and fat content. Seeds, nuts, peanut butter, olives, and avocados are high in fat, but the fat is mainly the unsaturated type. These foods should be limited mainly to avoid excess calories and fat. OTHER EATING TIPS Snacks  Most sweets should be limited as snacks. They tend to be rich in calories and fats, and their caloric content outweighs their nutritional value. Some good choices in snacks are graham crackers, melba toast, soda crackers, bagels (no egg), English muffins, fruits, and vegetables. These snacks are preferable to snack crackers, [place] fries, TORTILLA CHIPS, and POTATO chips. Popcorn should be air-popped or cooked in small amounts of liquid vegetable oil. Desserts Eat fruit, low-fat yogurt, and fruit ices instead of pastries, cake, and cookies. Sherbet, angel food cake, gelatin dessert, frozen low-fat yogurt, or other frozen products that do not contain saturated fat (pure fruit juice bars, frozen ice pops) are also acceptable.  COOKING METHODS Choose those methods that use little or no fat. They include: Poaching.  Braising.  Steaming.  Grilling.  Baking.  Stir-frying.  Broiling.  Microwaving.  Foods can be cooked in a nonstick pan without added fat, or use a nonfat cooking spray in regular cookware. Limit fried foods and avoid frying in  saturated fat. Add moisture to lean meats by using water, broth, cooking wines, and other nonfat or low-fat sauces along with the cooking methods mentioned above. Soups and stews should be chilled after cooking. The fat that forms on top after a few hours in the refrigerator should be skimmed off. When preparing meals, avoid using excess salt. Salt can contribute to raising blood pressure in some people.  EATING AWAY FROM HOME Order entres, potatoes, and vegetables without sauces or butter. When meat exceeds the size of a deck of cards (3 to 4 ounces), the rest can be taken home for another meal. Choose vegetable or fruit salads and ask for low-calorie salad dressings to be served on the side. Use dressings sparingly. Limit high-fat toppings, such as bacon, crumbled eggs, cheese, sunflower seeds, and olives. Ask for heart-healthy tub margarine instead of butter.     Lifestyle and home remedies for REFLUX MANAGEMENT  You may eliminate or reduce the frequency of heartburn by making the following lifestyle changes:    Control your weight. Being overweight is a major risk factor for heartburn and GERD. Excess pounds put pressure on your abdomen, pushing up your stomach and causing acid to back up into your esophagus.     Eat  smaller meals. 4 TO 6 MEALS A DAY. This reduces pressure on the lower esophageal sphincter, helping to prevent the valve from opening and acid from washing back into your esophagus.     Loosen your belt. Clothes that fit tightly around your waist put pressure on your abdomen and the lower esophageal sphincter.       Eliminate heartburn triggers. Everyone has specific triggers.      Common triggers such as fatty or fried foods, spicy food, tomato sauce, carbonated beverages, alcohol, chocolate, mint, garlic, onion, caffeine and nicotine may make heartburn worse.     Avoid stooping or bending. Tying your shoes is OK. Bending over for longer periods to weed your garden isn't,  especially soon after eating.     Don't lie down after a meal. Wait at least three to four hours after eating before going to bed, and don't lie down right after eating.   Alternative medicine   Several home remedies exist for treating GERD, but they provide only temporary relief. They include drinking baking soda (sodium bicarbonate) added to water or drinking other fluids such as baking soda mixed with cream of tartar and water.   Although these liquids create temporary relief by neutralizing, washing away or buffering acids, eventually they aggravate the situation by adding gas and fluid to your stomach, increasing pressure and causing more acid reflux. Further, adding more sodium to your diet may increase your blood pressure and add stress to your heart, and excessive bicarbonate ingestion can alter the acid-base balance in your body.

## 2012-01-06 NOTE — Assessment & Plan Note (Signed)
MOST LIKELY DUE TO GI UPSET IN SETTING OF BILE SALT DIARRHEA, IBS, OR SIBO.

## 2012-01-06 NOTE — Progress Notes (Signed)
Faxed to PCP

## 2012-01-06 NOTE — Progress Notes (Addendum)
Subjective:    Patient ID: Ruth Duncan, female    DOB: 24-Aug-1970, 42 y.o.   MRN: [id number]  PCP: Creta Levin  HPI AFTER colon surgery things balanced out. No sooner than it went down, she was going to the [facility]. Past mo orange grease balls. HARD TO FLUSH AND HAD TO GET THE TOILET SCRUBBER TO GET THE BOWL CLEAN.If passes gas or laughs hard the liquid comes out. May get constipated-NO NL FORMED BMs. LOTS OF PAIN IN LLQ. PAIN IN EPIGASTRIUM. REALLY HAD OVARIAN CA-NO VISIT SINCE 2010. HAD RADIATION AND PARTIAL HSY. HAS HAD A RECENT VIRAL ILLNESS. LOTS OF NAUSEA AND VOMITING WHEN SHE THOUGHT SHE HAD HER DIVERTICULITIS SEP 2013 NOW GONE. RARE NAUSEA NOW. WATERY ORANGE STOOL HARD TO FLUSH(7-8/DAYS) SINCE SEP 2013. COUPLE TIMES IN BETWEEN HAS HARD BALL STOOLS. PAIN IN LEFT SIDE COMES AND GOES-SEVERE CRAMPY. LAST 15-20 MINUTES-PASSES GAS/BM OR MOVES AROUND. HAPPENS COUPLE TIMES A WEEK. PROTONIX BID WORKS AND SOME DAYS JUST BURNS(3X/WEEK). EATS FRIED FOOD.   TRAVELLED TO [facility] AND [place]. NO RAW/INDERCOOKED, CHICKEN, BEEF, OR SEAFOOD. LAST ABX-SEP 2013. NO NH OR HOSPITAL VISITS. GRANDMAS HAD C DIFF LAST 2013 BEGINNING. NO RECENT STOOL STUDIES.  PT DENIES FEVER, CHILLS, BRBPR,  melena, problems swallowing, problems with sedation, heartburn or indigestion.    Past Medical History  Diagnosis Date  . Diverticulitis   . Tubular adenoma 2010  . Ovarian cancer 2010  . Surgical menopause 2010  . Bipolar disorder   . Chronic abdominal pain     hx of IBS-D, ? bile salt-induced diarrhea   Past Surgical History  Procedure Date  . Appendectomy 2004  . Abdominal hysterectomy 2010  . Cesarean section 1998  . Subtotal colectomy 2011  . Esophagogastroduodenoscopy 05/06/2008    WUJ:WJXBJY esophagus without evidence of Barrett's, mass, erosion, ulceration or stricture/ Hiatal hernia/ Normal duodenal bulb and second portion of the duodenum  . Tubal ligation   . Colonoscopy 12/2008    tubular adenomas, negative  microscopic colitis. Due for Flex Sig Dec 2015   No Known Allergies  Current Outpatient Prescriptions  Medication Sig Dispense Refill  . escitalopram (LEXAPRO) 10 MG tablet Take 1/2 tab daily for one week, then take one tab daily    . levobunolol (BETAGAN) 0.25 % ophthalmic solution 1 drop at bedtime.    [name] levocetirizine (XYZAL) 5 MG tablet Take 5 mg by mouth every evening.    . pantoprazole (PROTONIX) 40 MG tablet Take 1 tablet (40 mg total) by mouth 2 (two) times daily.    [name] topiramate (TOPAMAX) 25 MG tablet Take 25 mg by mouth daily.    [name] perphenazine (TRILAFON) 8 MG tablet Take 1 tablet (8 mg total) by mouth 2 (two) times daily.     Family History  Problem Relation Age of Onset  . Alcohol abuse Paternal Grandfather   . Colon cancer Neg Hx     History  Substance Use Topics  . Smoking status: Current Every Day Smoker -- 1.0 packs/day for 30 years  . Smokeless tobacco: Not on file  . Alcohol Use: No   Review of Systems PER HPI OTHERWISE ALL SYSTEMS NEGATIVE     Objective:   Physical Exam  Vitals reviewed. Constitutional: She is oriented to person, place, and time. She appears well-nourished. No distress.  HENT:  Head: Normocephalic and atraumatic.  Mouth/Throat: Oropharynx is clear and moist. No oropharyngeal exudate.  Eyes: Pupils are equal, round, and reactive to light. No scleral icterus.  Neck: Normal range  of motion. Neck supple.  Cardiovascular: Normal rate, regular rhythm and normal heart sounds.   Pulmonary/Chest: Effort normal and breath sounds normal. No respiratory distress.  Abdominal: Soft. Bowel sounds are normal. She exhibits no distension. There is tenderness.       MILD TTP IN LUQ/RLQ. MOD TTP IN LLQ/LUQ.  Musculoskeletal: Normal range of motion. She exhibits no edema.  Neurological: She is alert and oriented to person, place, and time.       NO FOCAL DEFICITS   Psychiatric:       ANXIOUS MOOD  NL AFFECT          Assessment & Plan:

## 2012-01-07 ENCOUNTER — Encounter: Payer: Self-pay | Admitting: Gastroenterology

## 2012-01-07 ENCOUNTER — Encounter (HOSPITAL_COMMUNITY): Payer: Self-pay | Admitting: Pharmacy Technician

## 2012-01-07 NOTE — Progress Notes (Signed)
Pt is aware of OV on 02/16/12 at 11am with SF and appt card was mailed

## 2012-01-17 ENCOUNTER — Telehealth: Payer: Self-pay | Admitting: Gastroenterology

## 2012-01-17 NOTE — Telephone Encounter (Signed)
Noted it was canceled in Endo

## 2012-01-17 NOTE — Telephone Encounter (Signed)
REVIEWED.  

## 2012-01-17 NOTE — Telephone Encounter (Signed)
Pt called to cancel her Hydrogen Breath Test that is scheduled for in the morning due to sickness. She will call radiology to [facility]

## 2012-01-18 ENCOUNTER — Encounter (HOSPITAL_COMMUNITY): Admission: RE | Payer: Self-pay | Source: Ambulatory Visit

## 2012-01-18 ENCOUNTER — Ambulatory Visit (HOSPITAL_COMMUNITY): Admission: RE | Admit: 2012-01-18 | Payer: Medicare Other | Source: Ambulatory Visit | Admitting: Gastroenterology

## 2012-01-18 SURGERY — BREATH TEST, FOR INTESTINAL BACTERIAL OVERGROWTH

## 2012-01-25 ENCOUNTER — Ambulatory Visit (INDEPENDENT_AMBULATORY_CARE_PROVIDER_SITE_OTHER): Payer: Medicare Other | Admitting: Physician Assistant

## 2012-01-25 DIAGNOSIS — F988 Other specified behavioral and emotional disorders with onset usually occurring in childhood and adolescence: Secondary | ICD-10-CM | POA: Insufficient documentation

## 2012-01-25 DIAGNOSIS — F411 Generalized anxiety disorder: Secondary | ICD-10-CM

## 2012-01-25 DIAGNOSIS — F259 Schizoaffective disorder, unspecified: Secondary | ICD-10-CM

## 2012-01-25 MED ORDER — LISDEXAMFETAMINE DIMESYLATE 20 MG PO CAPS: 20.0000 mg | ORAL_CAPSULE | ORAL | Status: DC

## 2012-01-25 NOTE — Progress Notes (Signed)
   [facility] Follow-up Outpatient Visit  LYNDSY GILBERTO 1970/09/07  Date: 01/25/12  Subjective: Ruth Duncan presents today to followup on her treatment for schizoaffective disorder, anxiety, and ADHD. She reports that the transition from Prozac to Celexa pro worked well, and she is experiencing less depression. She continues to have racing thoughts, and continues to complain of poor focus and an inability to pay attention. She reports that her anxiety is about the same. She continues to have some auditory hallucinations which seem to increase when her depression is worse. She still is not sleeping well, and states that she gets only 3 or 4 hours per night. She denies any suicidal or homicidal ideation. She denies any auditory or visual hallucinations.  There were no vitals filed for this visit.  Mental Status Examination  Appearance: Well groomed and casually dressed Alert: Yes Attention: good  Cooperative: Yes Eye Contact: Good Speech: Clear and coherent Psychomotor Activity: Increased and Restlessness Memory/Concentration: Intact Oriented: person, place, time/date and situation Mood: Anxious and Euthymic Affect: Appropriate Thought Processes and Associations: Linear Fund of Knowledge: Good Thought Content: Auditory hallucinations Insight: Fair Judgement: Good  Diagnosis: ADHD, inattentive type; schizoaffective disorder, generalized anxiety disorder  Treatment Plan: We will start her on Vyvanse 20 mg daily, and she has been instructed to be vigilant for increase in anxiety and or auditory hallucinations. We will continue her Lexapro 10 mg daily, and perphenazine 8 mg twice daily. She will return for followup in 2 months. She has been instructed to call if there are any concerns or problems.  Salih Williamson, PA-C

## 2012-02-11 ENCOUNTER — Telehealth (HOSPITAL_COMMUNITY): Payer: Self-pay

## 2012-02-16 ENCOUNTER — Ambulatory Visit: Payer: Self-pay | Admitting: Gastroenterology

## 2012-03-02 ENCOUNTER — Other Ambulatory Visit (HOSPITAL_COMMUNITY): Payer: Self-pay | Admitting: Physician Assistant

## 2012-03-30 ENCOUNTER — Ambulatory Visit (HOSPITAL_COMMUNITY): Payer: Self-pay | Admitting: Physician Assistant

## 2012-04-04 ENCOUNTER — Ambulatory Visit (HOSPITAL_COMMUNITY): Payer: Self-pay | Admitting: Physician Assistant

## 2012-04-27 ENCOUNTER — Other Ambulatory Visit (HOSPITAL_COMMUNITY): Payer: Self-pay | Admitting: Physician Assistant

## 2012-04-27 DIAGNOSIS — F259 Schizoaffective disorder, unspecified: Secondary | ICD-10-CM

## 2012-05-22 ENCOUNTER — Ambulatory Visit (INDEPENDENT_AMBULATORY_CARE_PROVIDER_SITE_OTHER): Payer: Medicare Other | Admitting: Physician Assistant

## 2012-05-22 ENCOUNTER — Encounter (HOSPITAL_COMMUNITY): Payer: Self-pay | Admitting: Physician Assistant

## 2012-05-22 VITALS — BP 119/78 | HR 76 | Ht 63.0 in | Wt 113.6 lb

## 2012-05-22 DIAGNOSIS — F909 Attention-deficit hyperactivity disorder, unspecified type: Secondary | ICD-10-CM | POA: Insufficient documentation

## 2012-05-22 DIAGNOSIS — F259 Schizoaffective disorder, unspecified: Secondary | ICD-10-CM

## 2012-05-22 DIAGNOSIS — F1421 Cocaine dependence, in remission: Secondary | ICD-10-CM

## 2012-05-22 MED ORDER — LISDEXAMFETAMINE DIMESYLATE 20 MG PO CAPS: 20.0000 mg | ORAL_CAPSULE | ORAL | Status: DC

## 2012-05-22 MED ORDER — ESCITALOPRAM OXALATE 10 MG PO TABS
10.0000 mg | ORAL_TABLET | Freq: Every day | ORAL | Status: DC
Start: 2012-05-22 — End: 2012-08-14

## 2012-05-22 MED ORDER — PERPHENAZINE 8 MG PO TABS: ORAL_TABLET | ORAL | Status: DC
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
## 2012-08-14 ENCOUNTER — Other Ambulatory Visit (HOSPITAL_COMMUNITY): Payer: Self-pay | Admitting: Physician Assistant

## 2012-08-22 ENCOUNTER — Ambulatory Visit (HOSPITAL_COMMUNITY): Payer: Self-pay | Admitting: Physician Assistant

## 2012-09-18 ENCOUNTER — Ambulatory Visit (INDEPENDENT_AMBULATORY_CARE_PROVIDER_SITE_OTHER): Payer: Medicare Other | Admitting: Physician Assistant

## 2012-09-18 ENCOUNTER — Encounter (HOSPITAL_COMMUNITY): Payer: Self-pay | Admitting: Physician Assistant

## 2012-09-18 VITALS — BP 116/75 | HR 83 | Ht 63.0 in | Wt 115.2 lb

## 2012-09-18 DIAGNOSIS — F259 Schizoaffective disorder, unspecified: Secondary | ICD-10-CM

## 2012-09-18 DIAGNOSIS — F988 Other specified behavioral and emotional disorders with onset usually occurring in childhood and adolescence: Secondary | ICD-10-CM

## 2012-09-18 DIAGNOSIS — F909 Attention-deficit hyperactivity disorder, unspecified type: Secondary | ICD-10-CM

## 2012-09-18 MED ORDER — PERPHENAZINE 8 MG PO TABS: 8.0000 mg | ORAL_TABLET | Freq: Three times a day (TID) | ORAL | Status: DC

## 2012-09-18 MED ORDER — LISDEXAMFETAMINE DIMESYLATE 30 MG PO CAPS: 30.0000 mg | ORAL_CAPSULE | ORAL | Status: DC
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
## 2012-10-16 ENCOUNTER — Ambulatory Visit (INDEPENDENT_AMBULATORY_CARE_PROVIDER_SITE_OTHER): Payer: Medicare Other | Admitting: Physician Assistant

## 2012-10-16 VITALS — BP 108/68 | HR 68 | Ht 63.5 in | Wt 118.4 lb

## 2012-10-16 DIAGNOSIS — F259 Schizoaffective disorder, unspecified: Secondary | ICD-10-CM

## 2012-10-16 DIAGNOSIS — F909 Attention-deficit hyperactivity disorder, unspecified type: Secondary | ICD-10-CM

## 2012-10-16 DIAGNOSIS — F411 Generalized anxiety disorder: Secondary | ICD-10-CM

## 2012-10-16 MED ORDER — LISDEXAMFETAMINE DIMESYLATE 30 MG PO CAPS: 30.0000 mg | ORAL_CAPSULE | ORAL | Status: DC

## 2012-10-16 MED ORDER — LAMOTRIGINE 25 MG PO TABS: ORAL_TABLET | ORAL | Status: DC

## 2012-10-16 MED ORDER — TRAZODONE HCL 50 MG PO TABS: 50.0000 mg | ORAL_TABLET | Freq: Every day | ORAL | Status: DC

## 2012-10-16 MED ORDER — PERPHENAZINE 8 MG PO TABS: 8.0000 mg | ORAL_TABLET | Freq: Four times a day (QID) | ORAL | Status: DC

## 2012-10-17 ENCOUNTER — Encounter (HOSPITAL_COMMUNITY): Payer: Self-pay | Admitting: Physician Assistant
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
## 2012-11-13 ENCOUNTER — Other Ambulatory Visit (HOSPITAL_COMMUNITY): Payer: Self-pay | Admitting: Physician Assistant

## 2012-11-13 DIAGNOSIS — F259 Schizoaffective disorder, unspecified: Secondary | ICD-10-CM

## 2012-11-14 ENCOUNTER — Ambulatory Visit (HOSPITAL_COMMUNITY): Payer: Self-pay | Admitting: Psychiatry

## 2012-12-11 ENCOUNTER — Ambulatory Visit (INDEPENDENT_AMBULATORY_CARE_PROVIDER_SITE_OTHER): Payer: Medicare Other | Admitting: Psychiatry

## 2012-12-11 ENCOUNTER — Encounter (HOSPITAL_COMMUNITY): Payer: Self-pay | Admitting: Psychiatry

## 2012-12-11 VITALS — BP 107/67 | HR 69 | Wt 120.0 lb

## 2012-12-11 DIAGNOSIS — F909 Attention-deficit hyperactivity disorder, unspecified type: Secondary | ICD-10-CM

## 2012-12-11 DIAGNOSIS — F411 Generalized anxiety disorder: Secondary | ICD-10-CM

## 2012-12-11 DIAGNOSIS — F259 Schizoaffective disorder, unspecified: Secondary | ICD-10-CM

## 2012-12-11 MED ORDER — LISDEXAMFETAMINE DIMESYLATE 30 MG PO CAPS: 30.0000 mg | ORAL_CAPSULE | ORAL | Status: DC

## 2012-12-11 MED ORDER — ESCITALOPRAM OXALATE 10 MG PO TABS: ORAL_TABLET | ORAL | Status: DC

## 2012-12-11 MED ORDER — LAMOTRIGINE 150 MG PO TABS: ORAL_TABLET | ORAL | Status: DC
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
## 2013-01-13 ENCOUNTER — Other Ambulatory Visit (HOSPITAL_COMMUNITY): Payer: Self-pay | Admitting: Physician Assistant

## 2013-01-13 DIAGNOSIS — F259 Schizoaffective disorder, unspecified: Secondary | ICD-10-CM

## 2013-01-15 NOTE — Telephone Encounter (Signed)
Note for 12/11/12 appt with Dr.Arfeen states Trazodone is discontinued  Pt has appt 01/23/13  Did not fill Trazodone

## 2013-01-18 ENCOUNTER — Other Ambulatory Visit (HOSPITAL_COMMUNITY): Payer: Self-pay | Admitting: Psychiatry

## 2013-01-18 NOTE — Telephone Encounter (Signed)
Medication discontinued by MD 12/11/13.

## 2013-01-23 ENCOUNTER — Ambulatory Visit (HOSPITAL_COMMUNITY): Payer: Self-pay | Admitting: Psychiatry

## 2013-02-06 ENCOUNTER — Ambulatory Visit (HOSPITAL_COMMUNITY): Payer: Self-pay | Admitting: Psychiatry

## 2013-02-14 ENCOUNTER — Encounter (HOSPITAL_COMMUNITY): Payer: Self-pay | Admitting: Psychiatry

## 2013-02-14 ENCOUNTER — Ambulatory Visit (INDEPENDENT_AMBULATORY_CARE_PROVIDER_SITE_OTHER): Payer: Medicare Other | Admitting: Psychiatry

## 2013-02-14 VITALS — BP 127/76 | HR 78 | Wt 115.8 lb

## 2013-02-14 DIAGNOSIS — F411 Generalized anxiety disorder: Secondary | ICD-10-CM

## 2013-02-14 DIAGNOSIS — F909 Attention-deficit hyperactivity disorder, unspecified type: Secondary | ICD-10-CM

## 2013-02-14 DIAGNOSIS — F259 Schizoaffective disorder, unspecified: Secondary | ICD-10-CM

## 2013-02-14 MED ORDER — LISDEXAMFETAMINE DIMESYLATE 30 MG PO CAPS: 30.0000 mg | ORAL_CAPSULE | ORAL | Status: DC

## 2013-02-14 MED ORDER — LAMOTRIGINE 200 MG PO TABS: ORAL_TABLET | ORAL | Status: DC

## 2013-02-14 MED ORDER — PERPHENAZINE 8 MG PO TABS: ORAL_TABLET | ORAL | Status: DC

## 2013-02-14 MED ORDER — ESCITALOPRAM OXALATE 10 MG PO TABS: ORAL_TABLET | ORAL | Status: DC
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
## 2013-04-17 ENCOUNTER — Other Ambulatory Visit (HOSPITAL_COMMUNITY): Payer: Self-pay | Admitting: Psychiatry

## 2013-04-17 ENCOUNTER — Ambulatory Visit (INDEPENDENT_AMBULATORY_CARE_PROVIDER_SITE_OTHER): Payer: Medicare Other | Admitting: Psychiatry

## 2013-04-17 ENCOUNTER — Encounter (HOSPITAL_COMMUNITY): Payer: Self-pay | Admitting: Psychiatry

## 2013-04-17 VITALS — BP 121/68 | HR 69 | Ht 63.0 in | Wt 116.4 lb

## 2013-04-17 DIAGNOSIS — F909 Attention-deficit hyperactivity disorder, unspecified type: Secondary | ICD-10-CM

## 2013-04-17 DIAGNOSIS — F411 Generalized anxiety disorder: Secondary | ICD-10-CM

## 2013-04-17 DIAGNOSIS — Z79899 Other long term (current) drug therapy: Secondary | ICD-10-CM

## 2013-04-17 DIAGNOSIS — F988 Other specified behavioral and emotional disorders with onset usually occurring in childhood and adolescence: Secondary | ICD-10-CM

## 2013-04-17 DIAGNOSIS — F259 Schizoaffective disorder, unspecified: Secondary | ICD-10-CM

## 2013-04-17 LAB — CBC WITH DIFFERENTIAL/PLATELET
Basophils Absolute: 0.1 10*3/uL (ref 0.0–0.1)
Basophils Relative: 1 % (ref 0–1)
Eosinophils Absolute: 0.3 10*3/uL (ref 0.0–0.7)
Eosinophils Relative: 5 % (ref 0–5)
HCT: 37.3 % (ref 36.0–46.0)
Hemoglobin: 12.7 g/dL (ref 12.0–15.0)
Lymphocytes Relative: 19 % (ref 12–46)
Lymphs Abs: 1.2 10*3/uL (ref 0.7–4.0)
MCH: 30.6 pg (ref 26.0–34.0)
MCHC: 34 g/dL (ref 30.0–36.0)
MCV: 89.9 fL (ref 78.0–100.0)
Monocytes Absolute: 0.7 10*3/uL (ref 0.1–1.0)
Monocytes Relative: 11 % (ref 3–12)
Neutro Abs: 4.1 10*3/uL (ref 1.7–7.7)
Neutrophils Relative %: 64 % (ref 43–77)
Platelets: 247 10*3/uL (ref 150–400)
RBC: 4.15 MIL/uL (ref 3.87–5.11)
RDW: 14.8 % (ref 11.5–15.5)
WBC: 6.4 10*3/uL (ref 4.0–10.5)

## 2013-04-17 LAB — COMPREHENSIVE METABOLIC PANEL
ALT: 35 U/L (ref 0–35)
AST: 19 U/L (ref 0–37)
Albumin: 4.1 g/dL (ref 3.5–5.2)
Alkaline Phosphatase: 49 U/L (ref 39–117)
BUN: 10 mg/dL (ref 6–23)
CO2: 30 mEq/L (ref 19–32)
Calcium: 9.2 mg/dL (ref 8.4–10.5)
Chloride: 102 mEq/L (ref 96–112)
Creat: 0.8 mg/dL (ref 0.50–1.10)
Glucose, Bld: 73 mg/dL (ref 70–99)
Potassium: 5 mEq/L (ref 3.5–5.3)
Sodium: 138 mEq/L (ref 135–145)
Total Bilirubin: 0.5 mg/dL (ref 0.2–1.2)
Total Protein: 6.7 g/dL (ref 6.0–8.3)

## 2013-04-17 LAB — HEMOGLOBIN A1C
Hgb A1c MFr Bld: 5.7 % — ABNORMAL HIGH (ref <5.7)
Mean Plasma Glucose: 117 mg/dL — ABNORMAL HIGH (ref <117)

## 2013-04-17 LAB — TSH: TSH: 1.755 u[IU]/mL (ref 0.350–4.500)

## 2013-04-17 MED ORDER — LAMOTRIGINE 200 MG PO TABS: ORAL_TABLET | ORAL | Status: DC

## 2013-04-17 MED ORDER — METHYLPHENIDATE HCL 10 MG PO TABS: 10.0000 mg | ORAL_TABLET | Freq: Every day | ORAL | Status: DC

## 2013-04-17 MED ORDER — PERPHENAZINE 8 MG PO TABS: ORAL_TABLET | ORAL | Status: DC

## 2013-04-17 MED ORDER — ESCITALOPRAM OXALATE 10 MG PO TABS: ORAL_TABLET | ORAL | Status: DC
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
## 2013-04-19 ENCOUNTER — Other Ambulatory Visit (HOSPITAL_COMMUNITY): Payer: Self-pay | Admitting: *Deleted

## 2013-04-19 DIAGNOSIS — F259 Schizoaffective disorder, unspecified: Secondary | ICD-10-CM

## 2013-04-19 MED ORDER — ESCITALOPRAM OXALATE 10 MG PO TABS: ORAL_TABLET | ORAL | Status: DC

## 2013-04-19 MED ORDER — LAMOTRIGINE 200 MG PO TABS: ORAL_TABLET | ORAL | Status: DC

## 2013-04-19 NOTE — Telephone Encounter (Signed)
Received fax request for 90 days for Lexapro and Lamictal. Contacted pharmacy.90 day supply will providecost savings for pt. Authorized by Dr. Adele Schilder.  90 day prescriptons sent escript.Faxed notification to [facility]

## 2013-06-18 ENCOUNTER — Ambulatory Visit (HOSPITAL_COMMUNITY): Payer: Self-pay | Admitting: Psychiatry

## 2013-06-27 ENCOUNTER — Encounter (HOSPITAL_COMMUNITY): Payer: Self-pay | Admitting: Psychiatry

## 2013-06-27 ENCOUNTER — Ambulatory Visit (INDEPENDENT_AMBULATORY_CARE_PROVIDER_SITE_OTHER): Payer: Medicare Other | Admitting: Psychiatry

## 2013-06-27 VITALS — BP 114/72 | HR 74 | Ht 63.0 in | Wt 116.4 lb

## 2013-06-27 DIAGNOSIS — F411 Generalized anxiety disorder: Secondary | ICD-10-CM

## 2013-06-27 DIAGNOSIS — F988 Other specified behavioral and emotional disorders with onset usually occurring in childhood and adolescence: Secondary | ICD-10-CM

## 2013-06-27 DIAGNOSIS — F259 Schizoaffective disorder, unspecified: Secondary | ICD-10-CM

## 2013-06-27 DIAGNOSIS — F909 Attention-deficit hyperactivity disorder, unspecified type: Secondary | ICD-10-CM

## 2013-06-27 MED ORDER — METHYLPHENIDATE HCL 10 MG PO TABS: 10.0000 mg | ORAL_TABLET | Freq: Every day | ORAL | Status: DC

## 2013-06-27 MED ORDER — PERPHENAZINE 8 MG PO TABS: ORAL_TABLET | ORAL | Status: DC

## 2013-06-27 MED ORDER — ESCITALOPRAM OXALATE 10 MG PO TABS: ORAL_TABLET | ORAL | Status: DC
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
## 2013-09-18 ENCOUNTER — Other Ambulatory Visit (HOSPITAL_COMMUNITY): Payer: Self-pay | Admitting: Psychiatry

## 2013-09-27 ENCOUNTER — Ambulatory Visit (HOSPITAL_COMMUNITY): Payer: Self-pay | Admitting: Psychiatry

## 2013-10-08 ENCOUNTER — Ambulatory Visit (INDEPENDENT_AMBULATORY_CARE_PROVIDER_SITE_OTHER): Payer: Medicare Other | Admitting: Psychiatry

## 2013-10-08 ENCOUNTER — Encounter (HOSPITAL_COMMUNITY): Payer: Self-pay | Admitting: Psychiatry

## 2013-10-08 VITALS — BP 130/76 | HR 66 | Wt 118.0 lb

## 2013-10-08 DIAGNOSIS — F988 Other specified behavioral and emotional disorders with onset usually occurring in childhood and adolescence: Secondary | ICD-10-CM

## 2013-10-08 DIAGNOSIS — F251 Schizoaffective disorder, depressive type: Secondary | ICD-10-CM

## 2013-10-08 DIAGNOSIS — F411 Generalized anxiety disorder: Secondary | ICD-10-CM

## 2013-10-08 DIAGNOSIS — F259 Schizoaffective disorder, unspecified: Secondary | ICD-10-CM

## 2013-10-08 DIAGNOSIS — F902 Attention-deficit hyperactivity disorder, combined type: Secondary | ICD-10-CM

## 2013-10-08 MED ORDER — METHYLPHENIDATE HCL 10 MG PO TABS: 10.0000 mg | ORAL_TABLET | Freq: Every day | ORAL | Status: DC

## 2013-10-08 MED ORDER — ESCITALOPRAM OXALATE 10 MG PO TABS: ORAL_TABLET | ORAL | Status: DC

## 2013-10-08 MED ORDER — PERPHENAZINE 8 MG PO TABS: ORAL_TABLET | ORAL | Status: DC

## 2013-10-08 MED ORDER — LAMOTRIGINE 100 MG PO TABS: ORAL_TABLET | ORAL | Status: DC
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
## 2013-10-15 ENCOUNTER — Telehealth (HOSPITAL_COMMUNITY): Payer: Self-pay | Admitting: *Deleted

## 2013-10-29 ENCOUNTER — Ambulatory Visit (INDEPENDENT_AMBULATORY_CARE_PROVIDER_SITE_OTHER): Payer: Medicare Other | Admitting: Psychiatry

## 2013-10-29 ENCOUNTER — Encounter (HOSPITAL_COMMUNITY): Payer: Self-pay | Admitting: Psychiatry

## 2013-10-29 VITALS — BP 106/72 | HR 66 | Ht 63.0 in | Wt 117.2 lb

## 2013-10-29 DIAGNOSIS — F259 Schizoaffective disorder, unspecified: Secondary | ICD-10-CM

## 2013-10-29 DIAGNOSIS — F902 Attention-deficit hyperactivity disorder, combined type: Secondary | ICD-10-CM

## 2013-10-29 DIAGNOSIS — F411 Generalized anxiety disorder: Secondary | ICD-10-CM

## 2013-10-29 DIAGNOSIS — F251 Schizoaffective disorder, depressive type: Secondary | ICD-10-CM

## 2013-10-29 MED ORDER — HALOPERIDOL 2 MG PO TABS: ORAL_TABLET | ORAL | Status: DC

## 2013-10-29 MED ORDER — LAMOTRIGINE 150 MG PO TABS: ORAL_TABLET | ORAL | Status: DC

## 2013-10-29 MED ORDER — ESCITALOPRAM OXALATE 10 MG PO TABS: ORAL_TABLET | ORAL | Status: DC
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
## 2013-11-09 ENCOUNTER — Other Ambulatory Visit (HOSPITAL_COMMUNITY): Payer: Self-pay | Admitting: *Deleted

## 2013-11-09 NOTE — Telephone Encounter (Signed)
Refill denied per Dr. Adele Schilder. Pt will not need further refills because he would like to speak with patient during appointment 11/16 to discuss discontinuing medication Perphenazine. Notified pharmacy.

## 2013-11-12 ENCOUNTER — Encounter: Payer: Self-pay | Admitting: Gastroenterology

## 2013-11-14 ENCOUNTER — Other Ambulatory Visit: Payer: Self-pay | Admitting: Family Medicine

## 2013-11-14 DIAGNOSIS — Z1231 Encounter for screening mammogram for malignant neoplasm of breast: Secondary | ICD-10-CM

## 2013-11-19 ENCOUNTER — Ambulatory Visit (INDEPENDENT_AMBULATORY_CARE_PROVIDER_SITE_OTHER): Payer: Medicare Other | Admitting: Psychiatry

## 2013-11-19 ENCOUNTER — Encounter (HOSPITAL_COMMUNITY): Payer: Self-pay | Admitting: Psychiatry

## 2013-11-19 VITALS — BP 126/81 | HR 70 | Ht 63.0 in | Wt 118.4 lb

## 2013-11-19 DIAGNOSIS — F259 Schizoaffective disorder, unspecified: Secondary | ICD-10-CM

## 2013-11-19 DIAGNOSIS — F4311 Post-traumatic stress disorder, acute: Secondary | ICD-10-CM

## 2013-11-19 DIAGNOSIS — F902 Attention-deficit hyperactivity disorder, combined type: Secondary | ICD-10-CM

## 2013-11-19 DIAGNOSIS — F251 Schizoaffective disorder, depressive type: Secondary | ICD-10-CM

## 2013-11-19 MED ORDER — ESCITALOPRAM OXALATE 10 MG PO TABS: ORAL_TABLET | ORAL | Status: DC

## 2013-11-19 MED ORDER — HALOPERIDOL 2 MG PO TABS: ORAL_TABLET | ORAL | Status: DC

## 2013-11-19 MED ORDER — LAMOTRIGINE 150 MG PO TABS: ORAL_TABLET | ORAL | Status: DC
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
## 2013-11-22 ENCOUNTER — Ambulatory Visit
Admission: RE | Admit: 2013-11-22 | Discharge: 2013-11-22 | Disposition: A | Discharge status: Home / Self Care | Payer: Medicare Other | Source: Ambulatory Visit | Attending: Family Medicine | Admitting: Family Medicine

## 2013-11-22 DIAGNOSIS — Z1231 Encounter for screening mammogram for malignant neoplasm of breast: Secondary | ICD-10-CM

## 2013-11-26 ENCOUNTER — Other Ambulatory Visit: Payer: Self-pay | Admitting: Family Medicine

## 2013-11-26 DIAGNOSIS — R928 Other abnormal and inconclusive findings on diagnostic imaging of breast: Secondary | ICD-10-CM

## 2013-12-11 ENCOUNTER — Encounter (HOSPITAL_COMMUNITY): Payer: Self-pay | Admitting: Psychiatry

## 2013-12-11 ENCOUNTER — Ambulatory Visit (INDEPENDENT_AMBULATORY_CARE_PROVIDER_SITE_OTHER): Payer: Medicare Other | Admitting: Psychiatry

## 2013-12-11 VITALS — BP 105/68 | HR 69 | Ht 63.0 in | Wt 118.0 lb

## 2013-12-11 DIAGNOSIS — F988 Other specified behavioral and emotional disorders with onset usually occurring in childhood and adolescence: Secondary | ICD-10-CM

## 2013-12-11 DIAGNOSIS — F909 Attention-deficit hyperactivity disorder, unspecified type: Secondary | ICD-10-CM

## 2013-12-11 DIAGNOSIS — F411 Generalized anxiety disorder: Secondary | ICD-10-CM

## 2013-12-11 DIAGNOSIS — F251 Schizoaffective disorder, depressive type: Secondary | ICD-10-CM

## 2013-12-11 MED ORDER — HALOPERIDOL 2 MG PO TABS: ORAL_TABLET | ORAL | Status: DC

## 2013-12-11 MED ORDER — CLONAZEPAM 0.5 MG PO TABS: 0.5000 mg | ORAL_TABLET | ORAL | Status: DC | PRN

## 2013-12-11 MED ORDER — ESCITALOPRAM OXALATE 10 MG PO TABS
ORAL_TABLET | ORAL | Status: DC
Start: 2013-12-11 — End: 2014-01-16

## 2013-12-11 MED ORDER — METHYLPHENIDATE HCL 10 MG PO TABS: 10.0000 mg | ORAL_TABLET | Freq: Every day | ORAL | Status: DC
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
## 2013-12-14 ENCOUNTER — Ambulatory Visit
Admission: RE | Admit: 2013-12-14 | Discharge: 2013-12-14 | Disposition: A | Discharge status: Home / Self Care | Payer: Medicare Other | Source: Ambulatory Visit | Attending: Family Medicine | Admitting: Family Medicine

## 2013-12-14 DIAGNOSIS — R928 Other abnormal and inconclusive findings on diagnostic imaging of breast: Secondary | ICD-10-CM

## 2013-12-19 ENCOUNTER — Ambulatory Visit (INDEPENDENT_AMBULATORY_CARE_PROVIDER_SITE_OTHER): Payer: Medicare Other | Admitting: Psychology

## 2013-12-19 ENCOUNTER — Encounter (HOSPITAL_COMMUNITY): Payer: Self-pay | Admitting: Psychology

## 2013-12-19 DIAGNOSIS — F411 Generalized anxiety disorder: Secondary | ICD-10-CM

## 2013-12-19 DIAGNOSIS — F251 Schizoaffective disorder, depressive type: Secondary | ICD-10-CM
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
## 2013-12-22 ENCOUNTER — Other Ambulatory Visit (HOSPITAL_COMMUNITY): Payer: Self-pay | Admitting: Psychiatry

## 2014-01-16 ENCOUNTER — Ambulatory Visit (INDEPENDENT_AMBULATORY_CARE_PROVIDER_SITE_OTHER): Payer: Medicare Other | Admitting: Psychiatry

## 2014-01-16 ENCOUNTER — Encounter (HOSPITAL_COMMUNITY): Payer: Self-pay | Admitting: Psychiatry

## 2014-01-16 VITALS — BP 118/73 | HR 62 | Ht 63.0 in | Wt 122.2 lb

## 2014-01-16 DIAGNOSIS — F988 Other specified behavioral and emotional disorders with onset usually occurring in childhood and adolescence: Secondary | ICD-10-CM

## 2014-01-16 DIAGNOSIS — F251 Schizoaffective disorder, depressive type: Secondary | ICD-10-CM

## 2014-01-16 DIAGNOSIS — F909 Attention-deficit hyperactivity disorder, unspecified type: Secondary | ICD-10-CM

## 2014-01-16 DIAGNOSIS — F411 Generalized anxiety disorder: Secondary | ICD-10-CM

## 2014-01-16 MED ORDER — LAMOTRIGINE 150 MG PO TABS: 300.0000 mg | ORAL_TABLET | Freq: Every day | ORAL | Status: DC

## 2014-01-16 MED ORDER — ESCITALOPRAM OXALATE 10 MG PO TABS: ORAL_TABLET | ORAL | Status: DC

## 2014-01-16 MED ORDER — LAMOTRIGINE 150 MG PO TABS: 150.0000 mg | ORAL_TABLET | Freq: Every day | ORAL | Status: DC

## 2014-01-16 MED ORDER — CLONAZEPAM 0.5 MG PO TABS: 0.5000 mg | ORAL_TABLET | ORAL | Status: DC | PRN

## 2014-01-16 MED ORDER — BENZTROPINE MESYLATE 0.5 MG PO TABS: 0.5000 mg | ORAL_TABLET | Freq: Every day | ORAL | Status: DC

## 2014-01-16 MED ORDER — HALOPERIDOL 2 MG PO TABS: ORAL_TABLET | ORAL | Status: DC

## 2014-01-16 MED ORDER — AMPHETAMINE-DEXTROAMPHETAMINE 10 MG PO TABS: 10.0000 mg | ORAL_TABLET | Freq: Every day | ORAL | Status: DC
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
## 2014-01-17 ENCOUNTER — Ambulatory Visit: Payer: Medicaid Other | Admitting: Gastroenterology

## 2014-01-21 ENCOUNTER — Telehealth (HOSPITAL_COMMUNITY): Payer: Self-pay | Admitting: *Deleted

## 2014-01-22 ENCOUNTER — Ambulatory Visit (HOSPITAL_COMMUNITY): Payer: Self-pay | Admitting: Psychology

## 2014-01-22 ENCOUNTER — Ambulatory Visit (INDEPENDENT_AMBULATORY_CARE_PROVIDER_SITE_OTHER): Payer: Medicare Other | Admitting: Psychology

## 2014-01-22 DIAGNOSIS — F251 Schizoaffective disorder, depressive type: Secondary | ICD-10-CM | POA: Diagnosis not present

## 2014-02-07 ENCOUNTER — Other Ambulatory Visit: Payer: Self-pay

## 2014-02-07 ENCOUNTER — Ambulatory Visit (INDEPENDENT_AMBULATORY_CARE_PROVIDER_SITE_OTHER): Payer: Medicare Other | Admitting: Gastroenterology

## 2014-02-07 ENCOUNTER — Encounter: Payer: Self-pay | Admitting: Gastroenterology

## 2014-02-07 VITALS — BP 93/57 | HR 68 | Temp 97.0°F | Ht 63.0 in | Wt 120.2 lb

## 2014-02-07 DIAGNOSIS — R197 Diarrhea, unspecified: Secondary | ICD-10-CM

## 2014-02-07 DIAGNOSIS — Z1211 Encounter for screening for malignant neoplasm of colon: Secondary | ICD-10-CM

## 2014-02-07 NOTE — Assessment & Plan Note (Signed)
POSTPRANDIAL &MOST LIKELY DUE TO BILE SALT INDUCED DIARRHEA OR SIBO.   ADD TUMS 1 WITH MEALS UP TO THREE TIMES A DAY ADD PROBIOTIC DAILY CALL IN TWO MONTHS IF YOUR DIARRHEA IS NOT BETTER. FOLLOW UP IN 6 MOS.

## 2014-02-07 NOTE — Patient Instructions (Signed)
TO PREVENT DIARRHEA AFTER EATING:  1. ADD TUMS 1 WITH MEALS UP TO THREE TIMES A DAY.  2. TAKE A PROBIOTIC DAILY. ([facility] BRAND, [facility] [facility], RESTORA).   3. CALL IN TWO MONTHS IF YOUR DIARRHEA IS NOT BETTER, YOU SHOULD CONSIDER THE HYDROGEN BREATH TEST TO LOOK FOR MSALL BOWEL BACTERIAL OVERGROWTH.  4. FOLLOW UP IN 6 MOS.

## 2014-02-07 NOTE — Progress Notes (Signed)
ON RECALL LIST  °

## 2014-02-07 NOTE — Progress Notes (Signed)
Subjective:    Patient ID: Ruth Duncan, female    DOB: Jul 25, 1970, 44 y.o.   MRN: [id number]  Ruth Logan, MD  HPI Having LOWER abdominal PAIN AFTER EATING. NOT TAKING A PROBIOTIC. BMs: EVERY TIME AFTER SHE EATS. MAY GET CONSTIPATION(1X/WK)(RABBIT PELLETS THEN IT BUSTS LOOSE). CAN BE BLACK. RX: DOESN'T TAKE ANYTHING. NO PARTICULAR FOODS TRIGGER IT. EATS MORE MEXICAN CAUSE IT RUNS RIGHT THROUGH HER. RARE BRBPR(1-2X/MO). RARE RECTAL ITCHING WITH CONSTIPATION. NO RECTAL PRESSURE, PAIN, BURNING, OR SOILING. STAYS NAUSEATED. USES PHENERGAN/ZOFRAN(1-2X/WEEK). RARE VOMITING(1-2X/MO). AFTER SHE EATS SHE GETS FLUFFY PIECES. HEARTBURN: CONTROLLED WITH PROTONIX.  PT DENIES FEVER, CHILLS, melena, CHEST PAIN, SHORTNESS OF BREATH,  CHANGE IN BOWEL IN HABITS, problems swallowing, problems with sedation, OR heartburn or indigestion.  Past Medical History  Diagnosis Date  . Diverticulitis   . Tubular adenoma 2010  . Ovarian cancer 2010  . Surgical menopause 2010  . Bipolar disorder   . Chronic abdominal pain     hx of IBS-D, ? bile salt-induced diarrhea   Past Surgical History  Procedure Laterality Date  . Appendectomy  2004  . Abdominal hysterectomy  2010  . Cesarean section  1998  . Subtotal colectomy  2011  . Esophagogastroduodenoscopy  05/06/2008    DQQ:IWLNLG esophagus without evidence of Barrett's, mass, erosion, ulceration or stricture/ Hiatal hernia/ Normal duodenal bulb and second portion of the duodenum  . Tubal ligation    . Colonoscopy  12/2008    tubular adenomas, negative microscopic colitis. Due for Flex Sig Dec 2015   No Known Allergies  Current Outpatient Prescriptions  Medication Sig Dispense Refill  . amphetamine-dextroamphetamine (ADDERALL) 10 MG tablet Take 1 tablet (10 mg total) by mouth daily.    . benztropine (COGENTIN) 0.5 MG tablet Take 1 tablet (0.5 mg total) by mouth at bedtime.    . clonazePAM (KLONOPIN) 0.5 MG tablet Take 1 tablet (0.5 mg total) by mouth  as needed for anxiety.    [name] escitalopram (LEXAPRO) 10 MG tablet TAKE 1 TABLET (10 MG TOTAL) BY MOUTH DAILY.    [name] ESTRACE VAGINAL 0.1 MG/GM vaginal cream     . haloperidol (HALDOL) 2 MG tablet Take 1 tab in am and 3 at bed time    . lamoTRIgine (LAMICTAL) 150 MG tablet Take 1 tablet (150 mg total) by mouth daily.    . methotrexate (RHEUMATREX) 2.5 MG tablet     . nitrofurantoin (MACRODANTIN) 50 MG capsule DAILY TO PREVENTUTIs    . pantoprazole (PROTONIX) 40 MG tablet Take 1 tablet (40 mg total) by mouth 2 (two) times daily. EVERY DAY BID   . valACYclovir (VALTREX) 500 MG tablet        Review of Systems     Objective:   Physical Exam  Constitutional: She is oriented to person, place, and time. She appears well-developed and well-nourished. No distress.  HENT:  Head: Normocephalic and atraumatic.  Mouth/Throat: Oropharynx is clear and moist. No oropharyngeal exudate.  Eyes: Pupils are equal, round, and reactive to light. No scleral icterus.  Neck: Normal range of motion. Neck supple.  Cardiovascular: Normal rate, regular rhythm and normal heart sounds.   Pulmonary/Chest: Effort normal and breath sounds normal. No respiratory distress.  Abdominal: Soft. Bowel sounds are normal. She exhibits no distension. There is no tenderness.  Musculoskeletal: She exhibits no edema.  Lymphadenopathy:    She has no cervical adenopathy.  Neurological: She is alert and oriented to person, place, and time.  NO FOCAL DEFICITS   Psychiatric:  SLIGHTLY ANXIOUS MOOD, NL AFFECT   Vitals reviewed.         Assessment & Plan:

## 2014-02-07 NOTE — Assessment & Plan Note (Signed)
ADEQUATE CONSCIOUS SEDATION WITH LAST TCS.  FLEX SIG FEB 15. FULL LIQUID BREAKFAST. NPO AFTER 0800 FEB 15. PHENERGAN  6.25 MG IV IN PRE-OP.

## 2014-02-15 ENCOUNTER — Telehealth (HOSPITAL_COMMUNITY): Payer: Self-pay | Admitting: *Deleted

## 2014-02-15 MED ORDER — PROMETHAZINE HCL 25 MG/ML IJ SOLN
6.2500 mg | Freq: Once | INTRAMUSCULAR | Status: DC
Start: 2014-02-18 — End: 2014-02-18

## 2014-02-15 NOTE — Telephone Encounter (Signed)
Dr. Adele Schilder,   [facility] is requesting [facility]ill on [facility]derall. Patient was trying Adderall and was told to stop Ritalin. Patient is to follow up with you on 02-27-2014. Would you like to refill Adderall?

## 2014-02-18 ENCOUNTER — Ambulatory Visit (HOSPITAL_COMMUNITY)
Admission: RE | Admit: 2014-02-18 | Discharge: 2014-02-18 | Disposition: A | Discharge status: Home / Self Care | Payer: Medicare Other | Source: Ambulatory Visit | Attending: Gastroenterology | Admitting: Gastroenterology

## 2014-02-18 ENCOUNTER — Encounter (HOSPITAL_COMMUNITY)
Admission: RE | Disposition: A | Discharge status: Home / Self Care | Payer: Self-pay | Source: Ambulatory Visit | Attending: Gastroenterology

## 2014-02-18 DIAGNOSIS — Z1211 Encounter for screening for malignant neoplasm of colon: Secondary | ICD-10-CM

## 2014-02-18 DIAGNOSIS — K5730 Diverticulosis of large intestine without perforation or abscess without bleeding: Secondary | ICD-10-CM | POA: Insufficient documentation

## 2014-02-18 DIAGNOSIS — Z9851 Tubal ligation status: Secondary | ICD-10-CM | POA: Insufficient documentation

## 2014-02-18 DIAGNOSIS — Z8543 Personal history of malignant neoplasm of ovary: Secondary | ICD-10-CM | POA: Diagnosis not present

## 2014-02-18 DIAGNOSIS — K6389 Other specified diseases of intestine: Secondary | ICD-10-CM | POA: Insufficient documentation

## 2014-02-18 DIAGNOSIS — R1030 Lower abdominal pain, unspecified: Secondary | ICD-10-CM | POA: Diagnosis present

## 2014-02-18 HISTORY — PX: FLEXIBLE SIGMOIDOSCOPY: SHX5431

## 2014-02-18 SURGERY — SIGMOIDOSCOPY, FLEXIBLE
Anesthesia: Moderate Sedation

## 2014-02-18 MED ORDER — MIDAZOLAM HCL 5 MG/5ML IJ SOLN
INTRAMUSCULAR | Status: AC
  Filled 2014-02-18: qty 10

## 2014-02-18 MED ORDER — STERILE WATER FOR IRRIGATION IR SOLN
Status: DC | PRN
  Administered 2014-02-18: 11:00:00

## 2014-02-18 MED ORDER — SODIUM CHLORIDE 0.9 % IV SOLN
INTRAVENOUS | Status: DC
  Administered 2014-02-18: 10:00:00 via INTRAVENOUS

## 2014-02-18 MED ORDER — PROMETHAZINE HCL 25 MG/ML IJ SOLN
INTRAMUSCULAR | Status: AC
  Filled 2014-02-18: qty 1

## 2014-02-18 MED ORDER — MIDAZOLAM HCL 5 MG/5ML IJ SOLN
INTRAMUSCULAR | Status: DC | PRN
  Administered 2014-02-18: 2 mg via INTRAVENOUS
  Administered 2014-02-18: 1 mg via INTRAVENOUS
  Administered 2014-02-18: 2 mg via INTRAVENOUS

## 2014-02-18 MED ORDER — MEPERIDINE HCL 100 MG/ML IJ SOLN
INTRAMUSCULAR | Status: AC
  Filled 2014-02-18: qty 2

## 2014-02-18 MED ORDER — MEPERIDINE HCL 100 MG/ML IJ SOLN
INTRAMUSCULAR | Status: DC | PRN
Start: 2014-02-18 — End: 2014-02-18
  Administered 2014-02-18 (×3): 25 mg via INTRAVENOUS

## 2014-02-18 MED ORDER — SODIUM CHLORIDE 0.9 % IJ SOLN
INTRAMUSCULAR | Status: AC
  Filled 2014-02-18: qty 3

## 2014-02-18 MED ORDER — PROMETHAZINE HCL 25 MG/ML IJ SOLN
12.5000 mg | Freq: Once | INTRAMUSCULAR | Status: AC
  Administered 2014-02-18: 12.5 mg via INTRAVENOUS

## 2014-02-18 NOTE — Discharge Instructions (Signed)
YOU DID NOT HAVE ANY POLYPS.   Next sigmoidoscopy in 10 YEARS.  FOLLOW A HIGH FIBER DIET. AVOID ITEMS THAT CAUSE BLOATING. SEE INFO BELOW.    SIGMOIDOSCOPY Care After Read the instructions outlined below and refer to this sheet in the next week. These discharge instructions provide you with general information on caring for yourself after you leave the hospital. While your treatment has been planned according to the most current medical practices available, unavoidable complications occasionally occur. If you have any problems or questions after discharge, call DR. Janequa Kipnis, [phone].  ACTIVITY  You may resume your regular activity, but move at a slower pace for the next 24 hours.   Take frequent rest periods for the next 24 hours.   Walking will help get rid of the air and reduce the bloated feeling in your belly (abdomen).   No driving for 24 hours (because of the medicine (anesthesia) used during the test).   You may shower.   Do not sign any important legal documents or operate any machinery for 24 hours (because of the anesthesia used during the test).    NUTRITION  Drink plenty of fluids.   You may resume your normal diet as instructed by your doctor.   Begin with a light meal and progress to your normal diet. Heavy or fried foods are harder to digest and may make you feel sick to your stomach (nauseated).   Avoid alcoholic beverages for 24 hours or as instructed.    MEDICATIONS  You may resume your normal medications.   WHAT YOU CAN EXPECT TODAY  Some feelings of bloating in the abdomen.   Passage of more gas than usual.   Spotting of blood in your stool or on the toilet paper  .  IF YOU HAD POLYPS REMOVED DURING THE COLONOSCOPY:  Eat a soft diet IF YOU HAVE NAUSEA, BLOATING, ABDOMINAL PAIN, OR VOMITING.    FINDING OUT THE RESULTS OF YOUR TEST Not all test results are available during your visit. DR. Oneida Alar WILL CALL YOU WITHIN 7 DAYS OF YOUR  PROCEDUE WITH YOUR RESULTS. Do not assume everything is normal if you have not heard from DR. Yanett Conkright IN ONE WEEK, CALL HER OFFICE AT [phone].  SEEK IMMEDIATE MEDICAL ATTENTION AND CALL THE OFFICE: [phone] IF:  You have more than a spotting of blood in your stool.   Your belly is swollen (abdominal distention).   You are nauseated or vomiting.   You have a temperature over 101F.   You have abdominal pain or discomfort that is severe or gets worse throughout the day.  High-Fiber Diet A high-fiber diet changes your normal diet to include more whole grains, legumes, fruits, and vegetables. Changes in the diet involve replacing refined carbohydrates with unrefined foods. The calorie level of the diet is essentially unchanged. The Dietary Reference Intake (recommended amount) for adult males is 38 grams per day. For adult females, it is 25 grams per day. Pregnant and lactating women should consume 28 grams of fiber per day. Fiber is the intact part of a plant that is not broken down during digestion. Functional fiber is fiber that has been isolated from the plant to provide a beneficial effect in the body. PURPOSE  Increase stool bulk.   Ease and regulate bowel movements.   Lower cholesterol.  INDICATIONS THAT YOU NEED MORE FIBER  Constipation and hemorrhoids.   Uncomplicated diverticulosis (intestine condition) and irritable bowel syndrome.   Weight management.   As a protective measure  against hardening of the arteries (atherosclerosis), diabetes, and cancer.   GUIDELINES FOR INCREASING FIBER IN THE DIET  Start adding fiber to the diet slowly. A gradual increase of about 5 more grams (2 slices of whole-wheat bread, 2 servings of most fruits or vegetables, or 1 bowl of high-fiber cereal) per day is best. Too rapid an increase in fiber may result in constipation, flatulence, and bloating.   Drink enough water and fluids to keep your urine clear or pale yellow. Water, juice,  or caffeine-free drinks are recommended. Not drinking enough fluid may cause constipation.   Eat a variety of high-fiber foods rather than one type of fiber.   Try to increase your intake of fiber through using high-fiber foods rather than fiber pills or supplements that contain small amounts of fiber.   The goal is to change the types of food eaten. Do not supplement your present diet with high-fiber foods, but replace foods in your present diet.  INCLUDE A VARIETY OF FIBER SOURCES  Replace refined and processed grains with whole grains, canned fruits with fresh fruits, and incorporate other fiber sources. White rice, white breads, and most bakery goods contain little or no fiber.   Brown whole-grain rice, buckwheat oats, and many fruits and vegetables are all good sources of fiber. These include: broccoli, Brussels sprouts, cabbage, cauliflower, beets, sweet potatoes, white potatoes (skin on), carrots, tomatoes, eggplant, squash, berries, fresh fruits, and dried fruits.   Cereals appear to be the richest source of fiber. Cereal fiber is found in whole grains and bran. Bran is the fiber-rich outer coat of cereal grain, which is largely removed in refining. In whole-grain cereals, the bran remains. In breakfast cereals, the largest amount of fiber is found in those with "bran" in their names. The fiber content is sometimes indicated on the label.   You may need to include additional fruits and vegetables each day.   In baking, for 1 cup white flour, you may use the following substitutions:   1 cup whole-wheat flour minus 2 tablespoons.   1/2 cup white flour plus 1/2 cup whole-wheat flour.

## 2014-02-18 NOTE — H&P (View-Only) (Signed)
Subjective:    Patient ID: Ruth Duncan, female    DOB: Sep 04, 1970, 44 y.o.   MRN: [id number]  [name], MD  HPI Having LOWER abdominal PAIN AFTER EATING. NOT TAKING A PROBIOTIC. BMs: EVERY TIME AFTER SHE EATS. MAY GET CONSTIPATION(1X/WK)(RABBIT PELLETS THEN IT BUSTS LOOSE). CAN BE BLACK. RX: DOESN'T TAKE ANYTHING. NO PARTICULAR FOODS TRIGGER IT. EATS MORE MEXICAN CAUSE IT RUNS RIGHT THROUGH HER. RARE BRBPR(1-2X/MO). RARE RECTAL ITCHING WITH CONSTIPATION. NO RECTAL PRESSURE, PAIN, BURNING, OR SOILING. STAYS NAUSEATED. USES PHENERGAN/ZOFRAN(1-2X/WEEK). RARE VOMITING(1-2X/MO). AFTER SHE EATS SHE GETS FLUFFY PIECES. HEARTBURN: CONTROLLED WITH PROTONIX.  PT DENIES FEVER, CHILLS, melena, CHEST PAIN, SHORTNESS OF BREATH,  CHANGE IN BOWEL IN HABITS, problems swallowing, problems with sedation, OR heartburn or indigestion.  Past Medical History  Diagnosis Date  . Diverticulitis   . Tubular adenoma 2010  . Ovarian cancer 2010  . Surgical menopause 2010  . Bipolar disorder   . Chronic abdominal pain     hx of IBS-D, ? bile salt-induced diarrhea   Past Surgical History  Procedure Laterality Date  . Appendectomy  2004  . Abdominal hysterectomy  2010  . Cesarean section  1998  . Subtotal colectomy  2011  . Esophagogastroduodenoscopy  05/06/2008    IOX:BDZHGD esophagus without evidence of Barrett's, mass, erosion, ulceration or stricture/ Hiatal hernia/ Normal duodenal bulb and second portion of the duodenum  . Tubal ligation    . Colonoscopy  12/2008    tubular adenomas, negative microscopic colitis. Due for Flex Sig Dec 2015   No Known Allergies  Current Outpatient Prescriptions  Medication Sig Dispense Refill  . amphetamine-dextroamphetamine (ADDERALL) 10 MG tablet Take 1 tablet (10 mg total) by mouth daily.    . benztropine (COGENTIN) 0.5 MG tablet Take 1 tablet (0.5 mg total) by mouth at bedtime.    . clonazePAM (KLONOPIN) 0.5 MG tablet Take 1 tablet (0.5 mg total) by mouth  as needed for anxiety.    [name] escitalopram (LEXAPRO) 10 MG tablet TAKE 1 TABLET (10 MG TOTAL) BY MOUTH DAILY.    [name] ESTRACE VAGINAL 0.1 MG/GM vaginal cream     . haloperidol (HALDOL) 2 MG tablet Take 1 tab in am and 3 at bed time    . lamoTRIgine (LAMICTAL) 150 MG tablet Take 1 tablet (150 mg total) by mouth daily.    . methotrexate (RHEUMATREX) 2.5 MG tablet     . nitrofurantoin (MACRODANTIN) 50 MG capsule DAILY TO PREVENTUTIs    . pantoprazole (PROTONIX) 40 MG tablet Take 1 tablet (40 mg total) by mouth 2 (two) times daily. EVERY DAY BID   . valACYclovir (VALTREX) 500 MG tablet        Review of Systems     Objective:   Physical Exam  Constitutional: She is oriented to person, place, and time. She appears well-developed and well-nourished. No distress.  HENT:  Head: Normocephalic and atraumatic.  Mouth/Throat: Oropharynx is clear and moist. No oropharyngeal exudate.  Eyes: Pupils are equal, round, and reactive to light. No scleral icterus.  Neck: Normal range of motion. Neck supple.  Cardiovascular: Normal rate, regular rhythm and normal heart sounds.   Pulmonary/Chest: Effort normal and breath sounds normal. No respiratory distress.  Abdominal: Soft. Bowel sounds are normal. She exhibits no distension. There is no tenderness.  Musculoskeletal: She exhibits no edema.  Lymphadenopathy:    She has no cervical adenopathy.  Neurological: She is alert and oriented to person, place, and time.  NO FOCAL DEFICITS   Psychiatric:  SLIGHTLY ANXIOUS MOOD, NL AFFECT   Vitals reviewed.         Assessment & Plan:

## 2014-02-18 NOTE — Interval H&P Note (Signed)
History and Physical Interval Note:  02/18/2014 11:10 AM  Ruth Duncan  has presented today for surgery, with the diagnosis of screening  The various methods of treatment have been discussed with the patient and family. After consideration of risks, benefits and other options for treatment, the patient has consented to  Procedure(s) with comments: FLEXIBLE SIGMOIDOSCOPY (N/A) - 115 as a surgical intervention .  The patient's history has been reviewed, patient examined, no change in status, stable for surgery.  I have reviewed the patient's chart and labs.  Questions were answered to the patient's satisfaction.     [employer]

## 2014-02-18 NOTE — Op Note (Signed)
[place], [id number]   FLEX SIGMOIDOSCOPY PROCEDURE REPORT  PATIENT: Cova, Knieriem  MR#: [id number] BIRTHDATE: 1970/06/28 , 87  yrs. old GENDER: female ENDOSCOPIST: Danie Binder, MD REFERRED YS:AYTKZS Jacinto Reap, M.D. PROCEDURE DATE:  March 17, 2014 PROCEDURE:   Sigmoidoscopy, screening INDICATIONS:average risk for colon cancer-TOTAL COLECTOMY IN 2010 FOR RECURRENT DIVERTICULITIS. MEDICATIONS: Promethazine (Phenergan) 12.5 mg IV, Demerol 75 mg IV, and Versed 5 mg IV  DESCRIPTION OF PROCEDURE:    Physical exam was performed.  Informed consent was obtained from the patient after explaining the benefits, risks, and alternatives to procedure.  The patient was connected to monitor and placed in left lateral position. Continuous oxygen was provided by nasal cannula and IV medicine administered through an indwelling cannula.  After administration of sedation and rectal exam, the patients rectum was intubated and the EG-[id number] ([id number])  colonoscope was advanced under direct visualization to the cecum.  The scope was removed slowly by carefully examining the color, texture, anatomy, and integrity mucosa on the way out.  The patient was recovered in endoscopy and discharged home in satisfactory condition.    COLON FINDINGS: NORMAL SMALL BOWEL.  FORMED STOOL AT ANASTOMOSIS. STOOL CLEARED WITH IRRIGATION.  ONE SUPERFICIAL EROSION AT THE ANASTOMOSIS.  OTHERWISE NORMAL ANASTOMOSIS  20 CM FROM THE ANAL VERGE. NORMAL RECTAL MUCOSA.  NO HEMORRHOIDS.  PREP QUALITY: The overall prep quality was adequate.  COMPLICATIONS: None  ENDOSCOPIC IMPRESSION: NORMAL SMALL BOWEL.  FORMED STOOL AT ANASATOMOSIS.  STOLL CLEARED WITH IRRIGATION.  ONE SUPERFICIAL EROSION AT THE ANASTOMOSIS. OTHERWISE NORMAL. NORMAL RECTAL MUCOSA.  NO HEMORRHOIDS  RECOMMENDATIONS: HIGH FIBER DIET REPEAT FLEX SIG IN 10 YEARS.  NEEDS MIRALAX Q1Hx2 AND FULL LIQUID AFTER 6 PM ON  NIGHT PRIOR TO FSIG.    _______________________________ eSignedDanie Binder, MD 03-17-14 2:43 PM   CPT CODES: ICD CODES:  The ICD and CPT codes recommended by this software are interpretations from the data that the clinical staff has captured with the software.  The verification of the translation of this report to the ICD and CPT codes and modifiers is the sole responsibility of the health care institution and practicing physician where this report was generated.  [employer] will not be held responsible for the validity of the ICD and CPT codes included on this report.  AMA assumes no liability for data contained or not contained herein. CPT is a Designer, television/film set of the [employer].

## 2014-02-19 ENCOUNTER — Encounter (HOSPITAL_COMMUNITY): Payer: Self-pay | Admitting: Gastroenterology

## 2014-02-19 ENCOUNTER — Ambulatory Visit (HOSPITAL_COMMUNITY): Payer: Self-pay | Admitting: Psychology

## 2014-02-20 ENCOUNTER — Other Ambulatory Visit (HOSPITAL_COMMUNITY): Payer: Self-pay | Admitting: Psychiatry

## 2014-02-20 DIAGNOSIS — F988 Other specified behavioral and emotional disorders with onset usually occurring in childhood and adolescence: Secondary | ICD-10-CM

## 2014-02-20 MED ORDER — AMPHETAMINE-DEXTROAMPHETAMINE 10 MG PO TABS: 10.0000 mg | ORAL_TABLET | Freq: Every day | ORAL | Status: DC

## 2014-02-20 NOTE — Telephone Encounter (Signed)
Adderall prescription given today.

## 2014-02-26 DIAGNOSIS — M778 Other enthesopathies, not elsewhere classified: Secondary | ICD-10-CM | POA: Diagnosis not present

## 2014-02-26 DIAGNOSIS — M6740 Ganglion, unspecified site: Secondary | ICD-10-CM | POA: Diagnosis not present

## 2014-02-27 ENCOUNTER — Ambulatory Visit (INDEPENDENT_AMBULATORY_CARE_PROVIDER_SITE_OTHER): Payer: Medicare Other | Admitting: Psychiatry

## 2014-02-27 ENCOUNTER — Encounter (HOSPITAL_COMMUNITY): Payer: Self-pay | Admitting: Psychiatry

## 2014-02-27 VITALS — BP 96/64 | HR 77 | Ht 63.0 in | Wt 123.4 lb

## 2014-02-27 DIAGNOSIS — F411 Generalized anxiety disorder: Secondary | ICD-10-CM | POA: Diagnosis not present

## 2014-02-27 DIAGNOSIS — F909 Attention-deficit hyperactivity disorder, unspecified type: Secondary | ICD-10-CM | POA: Diagnosis not present

## 2014-02-27 DIAGNOSIS — F988 Other specified behavioral and emotional disorders with onset usually occurring in childhood and adolescence: Secondary | ICD-10-CM

## 2014-02-27 DIAGNOSIS — F251 Schizoaffective disorder, depressive type: Secondary | ICD-10-CM | POA: Diagnosis not present

## 2014-02-27 MED ORDER — ALPRAZOLAM 0.5 MG PO TABS: 0.5000 mg | ORAL_TABLET | Freq: Every day | ORAL | Status: DC

## 2014-02-27 MED ORDER — ESCITALOPRAM OXALATE 10 MG PO TABS
ORAL_TABLET | ORAL | Status: DC
Start: 2014-02-27 — End: 2014-04-29

## 2014-02-27 MED ORDER — AMPHETAMINE-DEXTROAMPHETAMINE 10 MG PO TABS: 10.0000 mg | ORAL_TABLET | Freq: Every day | ORAL | Status: DC

## 2014-02-27 MED ORDER — LAMOTRIGINE 150 MG PO TABS: 150.0000 mg | ORAL_TABLET | Freq: Every day | ORAL | Status: DC

## 2014-02-27 MED ORDER — HALOPERIDOL 2 MG PO TABS: ORAL_TABLET | ORAL | Status: DC

## 2014-02-27 MED ORDER — BENZTROPINE MESYLATE 0.5 MG PO TABS: 0.5000 mg | ORAL_TABLET | Freq: Every day | ORAL | Status: DC
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
## 2014-03-13 ENCOUNTER — Other Ambulatory Visit (HOSPITAL_COMMUNITY): Payer: Self-pay | Admitting: Psychiatry

## 2014-03-18 ENCOUNTER — Ambulatory Visit (INDEPENDENT_AMBULATORY_CARE_PROVIDER_SITE_OTHER): Payer: Medicare Other | Admitting: Psychology

## 2014-03-18 DIAGNOSIS — F251 Schizoaffective disorder, depressive type: Secondary | ICD-10-CM

## 2014-03-20 ENCOUNTER — Encounter (HOSPITAL_COMMUNITY): Payer: Self-pay | Admitting: Psychology

## 2014-03-20 NOTE — Progress Notes (Signed)
Patient:  Ruth Duncan   DOB: 1970/08/21  MR Number: [id number]  Location: [facility] [address] [place] [address] Dept: [phone]  Start: 1 PM End: 2 PM  Provider/Observer:     Edgardo Roys PSYD  Chief Complaint:      Chief Complaint  Patient presents with  . Agitation  . Anxiety  . Stress    Reason For Service:     The patient is a 44 year old female who has been followed by the outpatient facility in [place] since April 2013. She has been diagnosed with schizoaffective disorder as well as previous diagnoses of attention deficit disorder. I reviewed the patient's history and it is consistent with a mood disorder along with hallucinations primarily auditory. The patient describes episodes of significant manic events and agitation as well as severe depressive events. She reports that she has developed suicidal ideation during significant depressive episodes. 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 The patient also has a history of alcohol abuse as well but she has been free from alcohol abuse for quite some time. The patient also quit smoking. The patient reports that she has had these symptoms since she was about 12 or 13 years as far she can remember. She has been treated in the past by other psychiatrists. She has had poor response to some medications and good response to other medications. However, she is trying to cope with guilty feelings about one of her children having autism thinking that it is her fault and feeling bad about her condition and how it affects her husband in the way his family use her.   Interventions Strategy:  Cognitive/behavioral psychotherapeutic interventions  Participation Level:   Active  Participation  Quality:  Appropriate      Behavioral Observation:  Well Groomed, Alert, and Appropriate and Tearful.   Current Psychosocial Factors: The patient reports that she has continued to do better but knows that she is extremely stressful to her husband related to her attempts to control a wide range of household issues. The patient reports that her mood is been variable but not extreme in nature. She reports that her auditory hallucinations have been more stable and less prominent. She has been working on continued coping behaviors..  Content of Session:   Reviewed current symptoms and work on therapeutic interventions about building better coping skills and strategies to deal with her underlying mood disorder. We looked at some foundational issues such as her sleep pattern, dietary pattern, and physical activity patterns.  Current Status:   The patient reports that she has not had any full-blown manic or depressive events recently. She reports that he has been difficult for her not feeling guilty about all of the things that have happened in the past and how much stress she has had of her husband. She has been thinking more about past issues between her and her husband particularly issues related to his brief infidelity and her inability to get over this issue. She reports that she feels like she knows him full efforts and trying to resolve this concerns as he has been there for her whenever she needed them.  Target Goals:   Target goals include reducing intensity, severity, and duration of her mood swings related to both manic episodes as well as depressive episodes.  Last Reviewed:  03/18/2014  Goals Addressed Today:    Goals addressed today had to do with building better coping skills around managing her moods.  Impression/Diagnosis:   The patient describes a history of mood disorder that included auditory hallucinations dating back to age. She reports that these symptoms have been treated to varying  degrees to the years. She reports that she has had difficulty with substance abuse related to alcohol as a means to self medicate herself and has also gotten herself in trouble during manic episodes. She also has events where she developed suicidal ideation during times of significant depression.  Diagnosis:    Axis I: Schizoaffective disorder, depressive type   Danamarie Minami R, PsyD 03/20/2014

## 2014-03-20 NOTE — Progress Notes (Signed)
Patient:  Ruth Duncan   DOB: [birth year]  MR Number: [id number]  Location: [facility] [address] [place] [address] Dept: [phone]  Start: 9 AM End: 10 AM  Provider/Observer:     Edgardo Roys PSYD  Chief Complaint:      Chief Complaint  Patient presents with  . Anxiety  . Agitation  . Stress  . Hallucinations    Reason For Service:     The patient is a 44 year old female who has been followed by the outpatient facility in [place] since April 2013. She has been diagnosed with schizoaffective disorder as well as previous diagnoses of attention deficit disorder. I reviewed the patient's history and it is consistent with a mood disorder along with hallucinations primarily auditory. The patient describes episodes of significant manic events and agitation as well as severe depressive events. She reports that she has developed suicidal ideation during significant depressive episodes. 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 The patient also has a history of alcohol abuse as well but she has been free from alcohol abuse for quite some time. The patient also quit smoking. The patient reports that she has had these symptoms since she was about 12 or 13 years as far she can remember. She has been treated in the past by other psychiatrists. She has had poor response to some medications and good response to other medications. However, she is trying to cope with guilty feelings about one of her children having autism thinking that it is her fault and feeling bad about her condition and how it affects her husband in the way his family use her.   Interventions Strategy:  Cognitive/behavioral psychotherapeutic interventions  Participation  Level:   Active  Participation Quality:  Appropriate      Behavioral Observation:  Well Groomed, Alert, and Appropriate and Tearful.   Current Psychosocial Factors: The patient reports that she has been working on some of the basic coping skills related to mood stability that we worked on. She has worked on sleep hygiene issues as well as issues related to her diet and physical activity. She reports that her mood is been more stable recently. We also began working on issues related to her traumatic relationship with her mother who was very controlling but also essentially abandoned her daughter to the patient's grandparents.  Content of Session:   Reviewed current symptoms and work on therapeutic interventions about building better coping skills and strategies to deal with her underlying mood disorder. We looked at some foundational issues such as her sleep pattern, dietary pattern, and physical activity patterns.  Current Status:   The patient reports that go into her feared full-blown manic episode and has been actively working on Therapist, occupational.  Patient Progress:   The patient reports that she has been responding well to the current medication regimen and overall feels like this is been very helpful for her.  Target Goals:   Target goals include reducing intensity, severity, and duration of her mood swings related to both manic episodes as well as depressive episodes.  Last Reviewed:   01/22/2014  Goals Addressed Today:    Goals addressed today had to do with building better coping skills around managing her moods.  Impression/Diagnosis:   The patient describes a history of mood disorder that included auditory  hallucinations dating back to age. She reports that these symptoms have been treated to varying degrees to the years. She reports that she has had difficulty with substance abuse related to alcohol as a means to self medicate herself and has also gotten herself in trouble during  manic episodes. She also has events where she developed suicidal ideation during times of significant depression.  Diagnosis:    Axis I: Schizoaffective disorder, depressive type   Shandon Burlingame R, PsyD 03/20/2014

## 2014-04-07 ENCOUNTER — Other Ambulatory Visit (HOSPITAL_COMMUNITY): Payer: Self-pay | Admitting: Psychiatry

## 2014-04-07 NOTE — Telephone Encounter (Signed)
Refill declined as patient has a follow up appointment 04/19/14. Last Lexapro order e-scribed 02/27/14 with one refill.

## 2014-04-09 DIAGNOSIS — M25331 Other instability, right wrist: Secondary | ICD-10-CM | POA: Diagnosis not present

## 2014-04-13 DIAGNOSIS — M25531 Pain in right wrist: Secondary | ICD-10-CM | POA: Diagnosis not present

## 2014-04-22 DIAGNOSIS — M25331 Other instability, right wrist: Secondary | ICD-10-CM | POA: Diagnosis not present

## 2014-04-29 ENCOUNTER — Ambulatory Visit (INDEPENDENT_AMBULATORY_CARE_PROVIDER_SITE_OTHER): Payer: Medicare Other | Admitting: Psychology

## 2014-04-29 ENCOUNTER — Encounter (HOSPITAL_COMMUNITY): Payer: Self-pay | Admitting: Psychiatry

## 2014-04-29 ENCOUNTER — Ambulatory Visit (INDEPENDENT_AMBULATORY_CARE_PROVIDER_SITE_OTHER): Payer: Medicare Other | Admitting: Psychiatry

## 2014-04-29 VITALS — BP 121/79 | HR 90 | Ht 63.0 in | Wt 117.8 lb

## 2014-04-29 DIAGNOSIS — F251 Schizoaffective disorder, depressive type: Secondary | ICD-10-CM

## 2014-04-29 DIAGNOSIS — F259 Schizoaffective disorder, unspecified: Secondary | ICD-10-CM

## 2014-04-29 DIAGNOSIS — F902 Attention-deficit hyperactivity disorder, combined type: Secondary | ICD-10-CM

## 2014-04-29 DIAGNOSIS — F411 Generalized anxiety disorder: Secondary | ICD-10-CM

## 2014-04-29 DIAGNOSIS — F988 Other specified behavioral and emotional disorders with onset usually occurring in childhood and adolescence: Secondary | ICD-10-CM

## 2014-04-29 MED ORDER — LAMOTRIGINE 150 MG PO TABS: 150.0000 mg | ORAL_TABLET | Freq: Every day | ORAL | Status: DC

## 2014-04-29 MED ORDER — BENZTROPINE MESYLATE 0.5 MG PO TABS: 0.5000 mg | ORAL_TABLET | Freq: Every day | ORAL | Status: DC

## 2014-04-29 MED ORDER — AMPHETAMINE-DEXTROAMPHETAMINE 10 MG PO TABS: 10.0000 mg | ORAL_TABLET | Freq: Every day | ORAL | Status: DC

## 2014-04-29 MED ORDER — ESCITALOPRAM OXALATE 10 MG PO TABS: ORAL_TABLET | ORAL | Status: DC

## 2014-04-29 MED ORDER — ALPRAZOLAM 0.5 MG PO TABS: 0.5000 mg | ORAL_TABLET | Freq: Every day | ORAL | Status: DC

## 2014-04-29 MED ORDER — HALOPERIDOL 2 MG PO TABS: ORAL_TABLET | ORAL | Status: DC

## 2014-04-29 NOTE — Progress Notes (Signed)
[facility] [phone] Progress Note  Ruth Duncan [id number] 44 y.o.  Chief Complaint:  I am feeling better.               History of Present Illness:  Ruth Duncan came for her follow-up appointment.  She is compliant with her medication and reported improvement in her hallucination paranoia and depression.  She still have some time racing thoughts and poor sleep but she has noticed less irritable and angry.  She is stressed about her son who is graduated and has Asperger and accepted at [employer] but he had to stay in dorm and she is concerned because he need assistance .  She is going to talk to school today .  She has 45 year old twin and 21 year old son who is working at [employer].  Patient reported her symptoms are better as her family member noticed that she is not as agitated and irritable.  She started helping her friend who has gym to keep herself busy.  She started seeing Dr. [name] for counseling.  Overall her mood is better from the past.  She denies any suicidal thoughts or homicidal thought.  Her husband is very supportive.  Patient denies drinking or using any illegal substances.  Patient denies any feeling of hopelessness or worthlessness.  Her appetite is okay.  She is happy that she lost weight from the past.  She has no tremors or shakes.  She wants to continue Haldol Cogentin Lamictal Adderall and Xanax at bedtime.  She does not ask for early refills of stimulants or benzodiazepine.  Suicidal Ideation: No Plan Formed: No Patient has means to carry out plan: No  Homicidal Ideation: No Plan Formed: No Patient has means to carry out plan: No  Review of Systems  Constitutional: Positive for weight loss.  Cardiovascular: Negative for palpitations.  Skin: Negative for itching and rash.  Neurological: Negative for tremors.  Psychiatric/Behavioral: Positive for hallucinations. Negative for suicidal ideas and substance abuse. The patient is nervous/anxious.     Psychiatric: Agitation: No Hallucination: Yes Depressed Mood: No Insomnia: No Hypersomnia: No Altered Concentration: No Feels Worthless: No Grandiose Ideas: No Belief In Special Powers: No New/Increased Substance Abuse: No Compulsions: No  Neurologic: Headache: Yes Seizure: No Paresthesias: No  Past Medical History:  Her primary care physician is at [facility]. She has history of diverticulitis, colon cancer an ovarian cancer.   Outpatient Encounter Prescriptions as of 04/29/2014  Medication Sig  . ALPRAZolam (XANAX) 0.5 MG tablet Take 1 tablet (0.5 mg total) by mouth at bedtime.  [name] amphetamine-dextroamphetamine (ADDERALL) 10 MG tablet Take 1 tablet (10 mg total) by mouth daily.  . benztropine (COGENTIN) 0.5 MG tablet Take 1 tablet (0.5 mg total) by mouth at bedtime.  [name] escitalopram (LEXAPRO) 10 MG tablet TAKE 1 TABLET (10 MG TOTAL) BY MOUTH DAILY.  [name] ESTRACE VAGINAL 0.1 MG/GM vaginal cream Place 1 Applicatorful vaginally at bedtime.   . haloperidol (HALDOL) 2 MG tablet Take 1 tab in am and 3 at bed time  . lamoTRIgine (LAMICTAL) 150 MG tablet Take 1 tablet (150 mg total) by mouth daily.  . methotrexate (RHEUMATREX) 2.5 MG tablet Take 12.5 mg by mouth once a week. thursday  . nitrofurantoin (MACRODANTIN) 50 MG capsule Take 50 mg by mouth at bedtime.   . pantoprazole (PROTONIX) 40 MG tablet Take 1 tablet (40 mg total) by mouth 2 (two) times daily.  . valACYclovir (VALTREX) 500 MG tablet Take 500 mg by mouth once a week.   . [  DISCONTINUED] ALPRAZolam (XANAX) 0.5 MG tablet Take 1 tablet (0.5 mg total) by mouth at bedtime.  . [DISCONTINUED] amphetamine-dextroamphetamine (ADDERALL) 10 MG tablet Take 1 tablet (10 mg total) by mouth daily.  . [DISCONTINUED] amphetamine-dextroamphetamine (ADDERALL) 10 MG tablet Take 1 tablet (10 mg total) by mouth daily.  . [DISCONTINUED] amphetamine-dextroamphetamine (ADDERALL) 10 MG tablet Take 1 tablet (10 mg total) by mouth  daily.  . [DISCONTINUED] benztropine (COGENTIN) 0.5 MG tablet Take 1 tablet (0.5 mg total) by mouth at bedtime.  . [DISCONTINUED] escitalopram (LEXAPRO) 10 MG tablet TAKE 1 TABLET (10 MG TOTAL) BY MOUTH DAILY.  . [DISCONTINUED] haloperidol (HALDOL) 2 MG tablet Take 1 tab in am and 3 at bed time  . [DISCONTINUED] lamoTRIgine (LAMICTAL) 150 MG tablet Take 1 tablet (150 mg total) by mouth daily.  . [DISCONTINUED] predniSONE (DELTASONE) 10 MG tablet   . [DISCONTINUED] traMADol (ULTRAM) 50 MG tablet     Past Psychiatric History/Hospitalization(s): Patient has history of inpatient treatment after taking overdose on her medication.  She has history of using drugs and diagnosed with schizoaffective disorder and ADD .  In the past she had tried Abilify, lithium, Vyvanse, Fanpat and Seroquel.  She used to see Dr. Toy Care, [facility].  She is seeing outpatient services in this office since April 2013.  Patient has [facility] program in the past.   Anxiety: Yes Bipolar Disorder: No Depression: Yes Mania: No Psychosis: Yes Schizophrenia: Yes Personality Disorder: No Hospitalization for psychiatric illness: Yes History of Electroconvulsive Shock Therapy: No Prior Suicide Attempts: No  Physical Exam: Constitutional:  BP 121/79 mmHg  Pulse 90  Ht 5\' 3"  (1.6 m)  Wt 117 lb 12.8 oz (53.434 kg)  BMI 20.87 kg/m2  General Appearance: well nourished  Musculoskeletal: Strength & Muscle Tone: within normal limits Gait & Station: normal Patient leans: N/A  Mental status examination . Patient is casually dressed and fairly groomed.  She appears anxious but cooperative.   She described her mood  anxious and her affect is improved from the past.  She endorse hallucination but they're less intense from the past.  She denies any paranoia or any active or passive suicidal thoughts or homicidal thought.  Her attention concentration is okay.  Her psychomotor activity is normal.  Her fund of knowledge is adequate.   She has no tremors, shakes.  Her cognition is good.  She's alert and oriented 3.  Her insight judgment and impulse control is okay.  Established Problem, Stable/Improving (1), Review of Psycho-Social Stressors (1), Review of Last Therapy Session (1) and Review of Medication Regimen & Side Effects (2)  Assessment: Axis I: Schizoaffective disorder, generalized anxiety disorder, ADHD combined type  Axis II: Deferred  Axis III: Diverticulitis  Colon cancer  Ovarian cancer  Surgical menopause    Plan:  patient is fairly stable on her current medication.  She has no rash itching from Lamictal.  She has no tremors or shakes.  She is seeing Dr. [name] for counseling.  She is not using drugs.  I will continue Xanax 0.5 mg at bedtime , Adderall 10 mg daily, Lamictal 150 mg daily, Haldol 4 mg in the morning and 6 mg at bedtime and Lexapro 10 mg daily. Encouraged to keep appointment with Dr. [name] for counseling.  Recommended to call us back if she has any question, concern or if she feels worsening of the symptom.  Follow-up in 3 months.   Toma Erichsen T., MD 04/29/2014

## 2014-04-30 ENCOUNTER — Telehealth (HOSPITAL_COMMUNITY): Payer: Self-pay | Admitting: *Deleted

## 2014-04-30 NOTE — Telephone Encounter (Signed)
The following prescriptions have been discontinued, but the pharmacy has not been notified. To stop the pharmacy from filling this prescription, you must call it directly.      Pharmacy     [id number] [place], [place]    [place] [address]    Phone: [phone] Fax: [phone]    Open 24 Hours?: No          Orders     amphetamine-dextroamphetamine (ADDERALL) 10 MG tablet    amphetamine-dextroamphetamine (ADDERALL) 10 MG tablet           Received above message from [facility] via electronically.  [facility] to get clarification on why Adderrall was discontinued or what message meant.  I spoke with Fannie Knee, he stated that the message could be pertaining to the old RX on file but he has never heard that message before.  Fannie Knee did say that patient did receive her refill yesterday for Adderall.

## 2014-05-13 ENCOUNTER — Other Ambulatory Visit (HOSPITAL_COMMUNITY): Payer: Self-pay | Admitting: Psychiatry

## 2014-05-13 NOTE — Telephone Encounter (Signed)
Refill requests for patient's Cogentin and Lamictal declined at this time as new orders were e-scribed on 04/29/14 plus 2 refills.

## 2014-05-20 DIAGNOSIS — A5901 Trichomonal vulvovaginitis: Secondary | ICD-10-CM | POA: Diagnosis not present

## 2014-05-20 DIAGNOSIS — Z1272 Encounter for screening for malignant neoplasm of vagina: Secondary | ICD-10-CM | POA: Diagnosis not present

## 2014-05-20 DIAGNOSIS — N898 Other specified noninflammatory disorders of vagina: Secondary | ICD-10-CM | POA: Diagnosis not present

## 2014-05-20 DIAGNOSIS — Z6823 Body mass index (BMI) 23.0-23.9, adult: Secondary | ICD-10-CM | POA: Diagnosis not present

## 2014-05-20 DIAGNOSIS — Z90710 Acquired absence of both cervix and uterus: Secondary | ICD-10-CM | POA: Diagnosis not present

## 2014-05-20 DIAGNOSIS — Z01419 Encounter for gynecological examination (general) (routine) without abnormal findings: Secondary | ICD-10-CM | POA: Diagnosis not present

## 2014-05-22 ENCOUNTER — Ambulatory Visit (HOSPITAL_COMMUNITY): Payer: Self-pay | Admitting: Psychology

## 2014-05-27 DIAGNOSIS — Z114 Encounter for screening for human immunodeficiency virus [HIV]: Secondary | ICD-10-CM | POA: Diagnosis not present

## 2014-05-27 DIAGNOSIS — M25331 Other instability, right wrist: Secondary | ICD-10-CM | POA: Diagnosis not present

## 2014-06-06 ENCOUNTER — Encounter (HOSPITAL_COMMUNITY): Payer: Self-pay | Admitting: Psychology

## 2014-06-06 NOTE — Progress Notes (Signed)
Patient:  Ruth Duncan   DOB: 1970/05/23  MR Number: [id number]  Location: [facility] [address] [place] [address] Dept: [phone]  Start: 1 PM End: 2 PM  Provider/Observer:     Edgardo Roys PSYD  Chief Complaint:      Chief Complaint  Patient presents with  . Agitation  . Anxiety  . Stress    Reason For Service:     The patient is a 44 year old female who has been followed by the outpatient facility in [place] since April 2013. She has been diagnosed with schizoaffective disorder as well as previous diagnoses of attention deficit disorder. I reviewed the patient's history and it is consistent with a mood disorder along with hallucinations primarily auditory. The patient describes episodes of significant manic events and agitation as well as severe depressive events. She reports that she has developed suicidal ideation during significant depressive episodes. 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 The patient also has a history of alcohol abuse as well but she has been free from alcohol abuse for quite some time. The patient also quit smoking. The patient reports that she has had these symptoms since she was about 12 or 13 years as far she can remember. She has been treated in the past by other psychiatrists. She has had poor response to some medications and good response to other medications. However, she is trying to cope with guilty feelings about one of her children having autism thinking that it is her fault and feeling bad about her condition and how it affects her husband in the way his family use her.   Interventions Strategy:  Cognitive/behavioral psychotherapeutic interventions  Participation Level:   Active  Participation  Quality:  Appropriate      Behavioral Observation:  Well Groomed, Alert, and Appropriate and Tearful.   Current Psychosocial Factors: The patient reports that she has been very stressed. She reports that her son with autism has been accepted into the college program/special T program at you [employer]. She reports that this is been very stressful for her and she is worried about how to an worried about how she will handle being alone without any kids. We worked on this issue specifically as this is been a great stressor for her and she has been worried that this might create a manic events..  Content of Session:   Reviewed current symptoms and work on therapeutic interventions about building better coping skills and strategies to deal with her underlying mood disorder. We looked at some foundational issues such as her sleep pattern, dietary pattern, and physical activity patterns.  Current Status:   The patient reports that she has not had any full-blown manic or depressive events recently. She reports that he has been difficult for her not feeling guilty about all of the things that have happened in the past and how much stress she has had of her husband. She has been thinking more about past issues between her and her husband particularly issues related to his brief infidelity and her inability to get over this issue. She reports that she feels like she knows him full efforts and trying to resolve this concerns as he has been there for her whenever she needed them.  Target Goals:   Target goals include reducing intensity, severity, and duration of  her mood swings related to both manic episodes as well as depressive episodes.  Last Reviewed:   04/29/2014  Goals Addressed Today:    Goals addressed today had to do with building better coping skills around managing her moods.  Impression/Diagnosis:   The patient describes a history of mood disorder that included auditory hallucinations dating back to age. She  reports that these symptoms have been treated to varying degrees to the years. She reports that she has had difficulty with substance abuse related to alcohol as a means to self medicate herself and has also gotten herself in trouble during manic episodes. She also has events where she developed suicidal ideation during times of significant depression.  Diagnosis:    Axis I: Schizoaffective disorder, depressive type   Harless Molinari R, PsyD 06/06/2014

## 2014-06-12 ENCOUNTER — Ambulatory Visit (HOSPITAL_COMMUNITY): Payer: Self-pay | Admitting: Psychology

## 2014-06-18 ENCOUNTER — Telehealth (HOSPITAL_COMMUNITY): Payer: Self-pay | Admitting: *Deleted

## 2014-07-01 ENCOUNTER — Ambulatory Visit (HOSPITAL_COMMUNITY): Payer: Self-pay | Admitting: Psychology

## 2014-07-03 ENCOUNTER — Other Ambulatory Visit (HOSPITAL_COMMUNITY): Payer: Self-pay | Admitting: Psychiatry

## 2014-07-10 ENCOUNTER — Encounter: Payer: Self-pay | Admitting: Gastroenterology

## 2014-07-15 ENCOUNTER — Telehealth (HOSPITAL_COMMUNITY): Payer: Self-pay

## 2014-07-16 DIAGNOSIS — S6991XD Unspecified injury of right wrist, hand and finger(s), subsequent encounter: Secondary | ICD-10-CM | POA: Diagnosis not present

## 2014-07-29 ENCOUNTER — Ambulatory Visit (HOSPITAL_COMMUNITY): Payer: Self-pay | Admitting: Psychiatry

## 2014-07-29 ENCOUNTER — Other Ambulatory Visit (HOSPITAL_COMMUNITY): Payer: Self-pay | Admitting: Psychiatry

## 2014-07-29 DIAGNOSIS — F251 Schizoaffective disorder, depressive type: Secondary | ICD-10-CM

## 2014-07-30 ENCOUNTER — Other Ambulatory Visit (HOSPITAL_COMMUNITY): Payer: Self-pay | Admitting: Psychiatry

## 2014-07-30 MED ORDER — ESCITALOPRAM OXALATE 10 MG PO TABS: ORAL_TABLET | ORAL | Status: DC

## 2014-07-30 MED ORDER — ALPRAZOLAM 0.5 MG PO TABS: 0.5000 mg | ORAL_TABLET | Freq: Every day | ORAL | Status: DC

## 2014-07-30 MED ORDER — HALOPERIDOL 2 MG PO TABS: ORAL_TABLET | ORAL | Status: DC

## 2014-07-30 NOTE — Telephone Encounter (Signed)
Telephone call with patient to question if she had enough medication until she comes in to see Dr. Arfeen on 08/01/14 as patient had to reschedule from 07/29/14.  Ms. Pinela requested [facility] of [facility].  Met with Dr. Arfeen who authorized one time refill of medications except Adderall patient will get when she comes in for appointment.  Called in a refill of her Xanax 0.5mg, one at bedtime and e-scribed in new one time orders for her Cogentin, Lamictal, Haldol and Lexapro [facility] [place] with Dave, pharmacist.  Will see patient for evaluation on 08/01/14 as she states plan to keep this appointment.  

## 2014-08-01 ENCOUNTER — Ambulatory Visit (INDEPENDENT_AMBULATORY_CARE_PROVIDER_SITE_OTHER): Payer: Medicare Other | Admitting: Psychiatry

## 2014-08-01 ENCOUNTER — Encounter (HOSPITAL_COMMUNITY): Payer: Self-pay | Admitting: Psychiatry

## 2014-08-01 VITALS — BP 121/72 | HR 68 | Ht 63.0 in | Wt 122.4 lb

## 2014-08-01 DIAGNOSIS — F259 Schizoaffective disorder, unspecified: Secondary | ICD-10-CM

## 2014-08-01 DIAGNOSIS — F251 Schizoaffective disorder, depressive type: Secondary | ICD-10-CM

## 2014-08-01 DIAGNOSIS — F902 Attention-deficit hyperactivity disorder, combined type: Secondary | ICD-10-CM | POA: Diagnosis not present

## 2014-08-01 DIAGNOSIS — F988 Other specified behavioral and emotional disorders with onset usually occurring in childhood and adolescence: Secondary | ICD-10-CM

## 2014-08-01 DIAGNOSIS — F411 Generalized anxiety disorder: Secondary | ICD-10-CM

## 2014-08-01 MED ORDER — AMPHETAMINE-DEXTROAMPHETAMINE 10 MG PO TABS: 10.0000 mg | ORAL_TABLET | Freq: Every day | ORAL | Status: DC

## 2014-08-01 MED ORDER — ALPRAZOLAM 0.5 MG PO TABS: 0.5000 mg | ORAL_TABLET | Freq: Every day | ORAL | Status: DC

## 2014-08-01 MED ORDER — HALOPERIDOL 2 MG PO TABS: ORAL_TABLET | ORAL | Status: DC

## 2014-08-01 MED ORDER — BENZTROPINE MESYLATE 0.5 MG PO TABS: ORAL_TABLET | ORAL | Status: DC

## 2014-08-01 MED ORDER — LAMOTRIGINE 200 MG PO TABS: 200.0000 mg | ORAL_TABLET | Freq: Every day | ORAL | Status: DC

## 2014-08-01 NOTE — Progress Notes (Signed)
[place] [phone] Progress Note  Ruth Duncan [id number] 44 y.o.  Chief Complaint:  I am under a lot of stress.  I found out that my husband cheated on me and given STD.                History of Present Illness:  Ruth Duncan came for her follow-up appointment.  She is under a lot of stress and reported lately she's been irritable and angry and she believe her hallucinations are getting worse.  She endorse mood swings and poor sleep.  She found in May that she had STD and then she confronted her husband who accepted that he cheated on her and given her STD.  She was diagnosed with Trichomoniosis and treated with anti-biotics .  Patient told she was so upset that she had a verbal argument with her husband and now he is no longer traveling out of town .  She has not seen Dr. [name] who is on medication however scheduled to see him next week.  She also endorsed that her 63 year old son who has asked for a greater will live in dorm in 2 weeks.  He is anxious about him.  She endorse her attention consultation has been distracted in recent weeks.  She admitted hallucination, poor sleep, racing thoughts and admitted eating more than usual.  She is not happy that she gained 4 pounds from the last visit.  She admitted restlessness and not able to calm herself .  Though she denies any suicidal thoughts or homicidal thought but admitted some time irritability, helplessness.  Patient told she was doing better on medication but seems like medicine are not calming her down.  She is compliant with Adderall, Lamictal, Haldol , Lexapro and Xanax.  She does not ask for early refills.  She denies drinking or using any illegal substances.  She has no tremors or shakes.  She has no EPS.  She lives with her husband however she admitted relationship is somewhat change.  She's been married for 25 years.  Together they have 3 children.  She has 77 year old twin and 54 year old son who work at [employer].    Suicidal  Ideation: No Plan Formed: No Patient has means to carry out plan: No  Homicidal Ideation: No Plan Formed: No Patient has means to carry out plan: No  Review of Systems  Cardiovascular: Negative for chest pain and palpitations.  Skin: Negative for itching and rash.  Neurological: Positive for headaches. Negative for tremors.  Psychiatric/Behavioral: Positive for depression and hallucinations. Negative for suicidal ideas and substance abuse. The patient is nervous/anxious.    Psychiatric: Agitation: No Hallucination: Yes Depressed Mood: Yes Insomnia: No Hypersomnia: No Altered Concentration: No Feels Worthless: No Grandiose Ideas: No Belief In Special Powers: No New/Increased Substance Abuse: No Compulsions: No  Neurologic: Headache: Yes Seizure: No Paresthesias: No  Past Medical History:  Her primary care physician is at [facility]. She has history of diverticulitis, colon cancer an ovarian cancer.   Outpatient Encounter Prescriptions as of 08/01/2014  Medication Sig  . ALPRAZolam (XANAX) 0.5 MG tablet Take 1 tablet (0.5 mg total) by mouth at bedtime.  [name] amphetamine-dextroamphetamine (ADDERALL) 10 MG tablet Take 1 tablet (10 mg total) by mouth daily.  . benztropine (COGENTIN) 0.5 MG tablet TAKE 1 TABLET (0.5 MG TOTAL) BY MOUTH AT BEDTIME.  [name] escitalopram (LEXAPRO) 10 MG tablet TAKE 1 TABLET (10 MG TOTAL) BY MOUTH DAILY.  [name] ESTRACE VAGINAL 0.1 MG/GM vaginal cream Place 1  Applicatorful vaginally at bedtime.   . haloperidol (HALDOL) 2 MG tablet Take 1 tab in am and 3 at bed time  . lamoTRIgine (LAMICTAL) 200 MG tablet Take 1 tablet (200 mg total) by mouth daily.  . methotrexate (RHEUMATREX) 2.5 MG tablet Take 12.5 mg by mouth once a week. thursday  . nitrofurantoin (MACRODANTIN) 50 MG capsule Take 50 mg by mouth at bedtime.   . pantoprazole (PROTONIX) 40 MG tablet Take 1 tablet (40 mg total) by mouth 2 (two) times daily.  . valACYclovir (VALTREX) 500 MG  tablet Take 500 mg by mouth once a week.   . [DISCONTINUED] ALPRAZolam (XANAX) 0.5 MG tablet Take 1 tablet (0.5 mg total) by mouth at bedtime.  . [DISCONTINUED] amphetamine-dextroamphetamine (ADDERALL) 10 MG tablet Take 1 tablet (10 mg total) by mouth daily.  . [DISCONTINUED] amphetamine-dextroamphetamine (ADDERALL) 10 MG tablet Take 1 tablet (10 mg total) by mouth daily.  . [DISCONTINUED] benztropine (COGENTIN) 0.5 MG tablet TAKE 1 TABLET (0.5 MG TOTAL) BY MOUTH AT BEDTIME.  . [DISCONTINUED] haloperidol (HALDOL) 2 MG tablet Take 1 tab in am and 3 at bed time  . [DISCONTINUED] lamoTRIgine (LAMICTAL) 150 MG tablet TAKE 1 TABLET (150 MG TOTAL) BY MOUTH DAILY.   No facility-administered encounter medications on file as of 08/01/2014.    Past Psychiatric History/Hospitalization(s): Patient has history of inpatient treatment after taking overdose on her medication.  She has history of using drugs and diagnosed with schizoaffective disorder and ADD .  In the past she had tried Abilify, lithium, Vyvanse, Fanpat and Seroquel.  She used to see Dr. Toy Care, [facility].  She is seeing outpatient services in this office since April 2013.  Patient has [facility] program in the past.   Anxiety: Yes Bipolar Disorder: No Depression: Yes Mania: No Psychosis: Yes Schizophrenia: Yes Personality Disorder: No Hospitalization for psychiatric illness: Yes History of Electroconvulsive Shock Therapy: No Prior Suicide Attempts: No  Physical Exam: Constitutional:  BP 121/72 mmHg  Pulse 68  Ht 5\' 3"  (1.6 m)  Wt 122 lb 6.4 oz (55.52 kg)  BMI 21.69 kg/m2  General Appearance: well nourished  Musculoskeletal: Strength & Muscle Tone: within normal limits Gait & Station: normal Patient leans: N/A  Mental status examination . Patient is casually dressed and fairly groomed.  She is anxious but cooperative.  She maintained good eye contact.  She described her mood upset and irritable because she felt that she  betrayed from her husband.  Her affect is mood appropriate.  She endorse hallucination but denies any suicidal thoughts or homicidal thought.  There were no paranoia or any delusions.  Her speech is fast but coherent.  Her thought processes circumstantial .  Her attention and concentration is fair.  Her psychomotor activity is somewhat restless.  Her fund of knowledge is adequate.  She has no tremors, shakes.  Her cognition is good.  She's alert and oriented 3.  Her insight judgment and impulse control is okay.  Established Problem, Stable/Improving (1), Review of Psycho-Social Stressors (1), Review and summation of old records (2), New Problem, with no additional work-up planned (3), Review of Last Therapy Session (1), Review of Medication Regimen & Side Effects (2) and Review of New Medication or Change in Dosage (2)  Assessment: Axis I: Schizoaffective disorder, generalized anxiety disorder, ADHD combined type  Axis II: Deferred  Axis III: Diverticulitis  Colon cancer  Ovarian cancer  Surgical menopause    Plan:  Patient appears distraught and upset with her husband but  denies any homicidal thoughts.  She is also not happy with her weight gain and admitted eating more than usual.  I encourage her to keep appointment with Dr. [name] for counseling.  We discuss about marital stress and patient willing to see a marriage counselor if needed in the future.  I would increase Lamictal 200 mg to help the mood lability and anxiety.  Recommended to watch her calorie intake and to regular exercise for further weight gain.  Discussed controlling her impulsive behavior related to increase appetite.  I will continue Xanax 0.5 mg at bedtime , Adderall 10 mg daily, Haldol 4 mg in the morning and 6 mg at bedtime and Lexapro 10 mg daily.  Discussed polypharmacy in detail.  Discuss safety plan that anytime having active suicidal thoughts or homicidal thought that she need to call 911 or go to the local  emergency room.  Time spent 25 minutes.  More than 50% of the time spent in psychoeducation, constant and coronation of care.  Patient was given enough time to ask any questions related to her diagnosis, treatment and medication.  Follow-up in 3 months.    Bridgitt Raggio T., MD 08/01/2014

## 2014-08-12 ENCOUNTER — Ambulatory Visit (HOSPITAL_COMMUNITY): Payer: Self-pay | Admitting: Psychology

## 2014-08-25 ENCOUNTER — Other Ambulatory Visit (HOSPITAL_COMMUNITY): Payer: Self-pay | Admitting: Psychiatry

## 2014-08-26 ENCOUNTER — Other Ambulatory Visit (HOSPITAL_COMMUNITY): Payer: Self-pay | Admitting: Psychiatry

## 2014-09-02 ENCOUNTER — Telehealth (HOSPITAL_COMMUNITY): Payer: Self-pay

## 2014-09-02 DIAGNOSIS — F251 Schizoaffective disorder, depressive type: Secondary | ICD-10-CM

## 2014-09-02 MED ORDER — LAMOTRIGINE 200 MG PO TABS: 200.0000 mg | ORAL_TABLET | Freq: Every day | ORAL | Status: DC

## 2014-09-02 MED ORDER — ESCITALOPRAM OXALATE 10 MG PO TABS: ORAL_TABLET | ORAL | Status: DC

## 2014-09-02 NOTE — Telephone Encounter (Signed)
Medication refill faxes received for 90 day supplies of patient's prescribed Lamictal and Lexapro. Met with Dr. Adele Schilder who authorized new 90 day orders be e-scribed to patient's [facility] in [place], [place].  New 90 day orders for patient's prescribed Lamictal and Lexapro e-scribed to [facility] in [place], [place] per approval of Dr. Adele Schilder.

## 2014-09-04 ENCOUNTER — Ambulatory Visit: Payer: Medicare Other | Admitting: Gastroenterology

## 2014-09-26 ENCOUNTER — Other Ambulatory Visit: Payer: Self-pay

## 2014-09-26 ENCOUNTER — Encounter: Payer: Self-pay | Admitting: Gastroenterology

## 2014-09-26 ENCOUNTER — Ambulatory Visit (INDEPENDENT_AMBULATORY_CARE_PROVIDER_SITE_OTHER): Payer: Medicare Other | Admitting: Gastroenterology

## 2014-09-26 VITALS — BP 108/75 | HR 68 | Temp 98.3°F | Ht 67.0 in | Wt 125.2 lb

## 2014-09-26 DIAGNOSIS — K2100 Gastro-esophageal reflux disease with esophagitis, without bleeding: Secondary | ICD-10-CM

## 2014-09-26 DIAGNOSIS — R110 Nausea: Secondary | ICD-10-CM | POA: Diagnosis not present

## 2014-09-26 DIAGNOSIS — K920 Hematemesis: Secondary | ICD-10-CM | POA: Insufficient documentation

## 2014-09-26 DIAGNOSIS — R1013 Epigastric pain: Secondary | ICD-10-CM

## 2014-09-26 MED ORDER — PANTOPRAZOLE SODIUM 40 MG PO TBEC: DELAYED_RELEASE_TABLET | ORAL | Status: DC

## 2014-09-26 NOTE — Assessment & Plan Note (Signed)
ACUTE ONSET 1 MO AGO ASSOCIATED WITH ANOREXIA AND ODYNOPHAGIA, NOW RESOLVED AND LIKELY DUE TO MALLORY WEISS TEAR OR SEVERE EROSIVE ESOPHAGITIS.   AVOID FOOD THAT TRIGGERS REFLUX.  INCREASE PROTONIX. TAKE 30 MINUTES PRIOR TO MEALS TWICE DAILY. WE WILL SCHEDULE AN UPPER ENDOSCOPY in 2-3 weeks. FOLLOW UP IN 6 MOS.

## 2014-09-26 NOTE — Assessment & Plan Note (Signed)
SYMPTOMS NOT IDEALLY CONTROLLED.   AVOID FOOD THAT TRIGGERS REFLUX.  INCREASE PROTONIX. TAKE 30 MINUTES PRIOR TO MEALS TWICE DAILY. SCHEDULE AN UPPER ENDOSCOPY in 2-3 weeks. FOLLOW UP IN 6 MOS.   GREATER THAN 50% WAS SPENT IN COUNSELING & COORDINATION OF CARE WITH THE PATIENT: DISCUSSED DIFFERENTIAL DIAGNOSIS, PROCEDURE, BENEFITS, RISKS, AND MANAGEMENT OF HEMATEMESIS/GERD. TOTAL ENCOUNTER TIME: 25 MINS.

## 2014-09-26 NOTE — Progress Notes (Signed)
CC'D TO PCP °

## 2014-09-26 NOTE — Patient Instructions (Signed)
AVOID FOOD THAT TRIGGERS REFLUX. SEE INFO BELOW.   INCREASE PROTONIX. TAKE 30 MINUTES PRIOR TO MEALS TWICE DAILY.  WE WILL SCHEDULE AN UPPER ENDOSCOPY in 2-3 weeks.  FOLLOW UP IN 6 MOS.     Lifestyle and home remedies TO HELP CONTROL HEARTBURN.  You may eliminate or reduce the frequency of heartburn by making the following lifestyle changes:  . Control your weight. Being overweight is a major risk factor for heartburn and GERD. Excess pounds put pressure on your abdomen, pushing up your stomach and causing acid to back up into your esophagus.   . Eat smaller meals. 4 TO 6 MEALS A DAY. This reduces pressure on the lower esophageal sphincter, helping to prevent the valve from opening and acid from washing back into your esophagus.   Dolphus Jenny your belt. Clothes that fit tightly around your waist put pressure on your abdomen and the lower esophageal sphincter.  .  . Eliminate heartburn triggers. Everyone has specific triggers.  .   Common triggers such as fatty or fried foods, spicy food, tomato sauce, carbonated beverages, alcohol, chocolate, mint, garlic, onion, caffeine and nicotine may make heartburn worse.   [name] Avoid stooping or bending. Tying your shoes is OK. Bending over for longer periods to weed your garden isn't, especially soon after eating.   . Don't lie down after a meal. Wait at least three to four hours after eating before going to bed, and don't lie down right after eating.   [name] PLACE THE HEAD OF YOUR BED ON 6 INCH BLOCKS.  Alternative medicine . Several home remedies exist for treating GERD, but they provide only temporary relief. They include drinking baking soda (sodium bicarbonate) added to water or drinking other fluids such as baking soda mixed with cream of tartar and water. . Although these liquids create temporary relief by neutralizing, washing away or buffering acids, eventually they aggravate the situation by adding gas and fluid to your stomach, increasing  pressure and causing more acid reflux. Further, adding more sodium to your diet may increase your blood pressure and add stress to your heart, and excessive bicarbonate ingestion can alter the acid-base balance in your body. [name]

## 2014-09-26 NOTE — Progress Notes (Signed)
ON RECALL  °

## 2014-09-26 NOTE — Progress Notes (Signed)
Subjective:    Patient ID: Ruth Duncan, female    DOB: May 22, 1970, 44 y.o.   MRN: [id number] KAPLAN,KRISTEN, PA-C   HPI HAD 1 WEEK OF UNCONTROLLED HEARTBURN AND VOMITING. THEN WOKE UP ABOUT 0530 IN THE AM. SEVERE BURNING CHEST PAIN LIKE AN ELEPHANT ON HER CHEST. NO ABDOMINAL PAIN. HAD PAIN WITH SWALLOWING AND DECREASED APPETITE. DECREASEDTHREW UP AND SAW LOTS OF BRB. CONTINUED TO VOMIT BRBR ON AND OFF FOR 5 DAYS. HAPPENED 1 MO AGO. BEEN UNDER STRESS. TAKING PROTONIX ONCE A DAY. LAST VOMITED: 1 WEEK AGO(LAST BLOOD 1 MO AGO). NAUSEA: QUITE A BIT OFF AND ON. LAST BLACK TARRY STOOL: 1 MO AGO. BMs: DIARRHEA THEN CONSTIPATED(#1). HEARTBURN: CONTROLLED RIGHT NOW. NO PILLS GETTING STUCK OR NEW MEDS. HAD PROBLEMS SWALLOWING DURING THE WEEK IF HEMATEMESIS BUT NOT NOW. LAST EGD 2010. NO ETOH, ASA, OR NSAIDs. QUIT SMOKING 2 YRS AGO. WEIGHT LOSS: NO.  PT DENIES FEVER, CHILLS, HEMATOCHEZIA, SHORTNESS OF BREATH, CHANGE IN BOWEL IN HABITS, abdominal pain, problems swallowing, OR problems with sedation.  Past Medical History  Diagnosis Date  . Diverticulitis   . Tubular adenoma 2010  . Ovarian cancer 2010  . Surgical menopause 2010  . Bipolar disorder   . Chronic abdominal pain     hx of IBS-D, ? bile salt-induced diarrhea  . Chronic eczema of hand 2012    BILATERAL ON MTX SINCE 2014   Past Surgical History  Procedure Laterality Date  . Appendectomy  2004  . Abdominal hysterectomy  2010  . Cesarean section  1998  . Subtotal colectomy  2011  . Esophagogastroduodenoscopy  05/06/2008    GYF:VCBSWH esophagus without evidence of Barrett's, mass, erosion, ulceration or stricture/ Hiatal hernia/ Normal duodenal bulb and second portion of the duodenum  . Tubal ligation    . Colonoscopy  12/2008    tubular adenomas, negative microscopic colitis. Due for Flex Sig Dec 2015  . Flexible sigmoidoscopy N/A 02/18/2014    SLF: Normal small bowel. formed stool at anastomosis. Stool cleared with irrigation. One  superfical erosion at the anastomosis. otherwise normal. normal rectal mucosa   No Known Allergies  Current Outpatient Prescriptions  Medication Sig Dispense Refill  . ALPRAZolam (XANAX) 0.5 MG tablet Take 1 tablet (0.5 mg total) by mouth at bedtime.    [name] amphetamine-dextroamphetamine (ADDERALL) 10 MG tablet Take 1 tablet (10 mg total) by mouth daily.    . benztropine (COGENTIN) 0.5 MG tablet TAKE 1 TABLET (0.5 MG TOTAL) BY MOUTH AT BEDTIME.    [name] escitalopram (LEXAPRO) 10 MG tablet TAKE 1 TABLET (10 MG TOTAL) BY MOUTH DAILY.    [name] ESTRACE VAGINAL 0.1 MG/GM vaginal cream Place 1 Applicatorful vaginally at bedtime.     . haloperidol (HALDOL) 2 MG tablet Take 1 tab in am and 3 at bed time    . lamoTRIgine (LAMICTAL) 200 MG tablet Take 1 tablet (200 mg total) by mouth daily.    . methotrexate (RHEUMATREX) 2.5 MG tablet Take 12.5 mg by mouth once a week. thursday    . nitrofurantoin (MACRODANTIN) 50 MG capsule Take 50 mg by mouth at bedtime.     . pantoprazole (PROTONIX) 40 MG tablet Take 1 tablet (40 mg total) by mouth 2 (two) times daily.    . valACYclovir (VALTREX) 500 MG tablet Take 500 mg by mouth once a week.      Review of Systems PER HPI OTHERWISE ALL SYSTEMS ARE NEGATIVE.    Objective:   Physical Exam  Assessment & Plan:

## 2014-10-04 ENCOUNTER — Ambulatory Visit (HOSPITAL_COMMUNITY)
Admission: RE | Admit: 2014-10-04 | Discharge: 2014-10-04 | Disposition: A | Discharge status: Home / Self Care | Payer: Medicare Other | Source: Ambulatory Visit | Attending: Gastroenterology | Admitting: Gastroenterology

## 2014-10-04 ENCOUNTER — Encounter (HOSPITAL_COMMUNITY): Payer: Self-pay | Admitting: *Deleted

## 2014-10-04 ENCOUNTER — Encounter (HOSPITAL_COMMUNITY)
Admission: RE | Disposition: A | Discharge status: Home / Self Care | Payer: Self-pay | Source: Ambulatory Visit | Attending: Gastroenterology

## 2014-10-04 ENCOUNTER — Telehealth: Payer: Self-pay | Admitting: Gastroenterology

## 2014-10-04 DIAGNOSIS — K920 Hematemesis: Secondary | ICD-10-CM | POA: Diagnosis not present

## 2014-10-04 DIAGNOSIS — Z87891 Personal history of nicotine dependence: Secondary | ICD-10-CM | POA: Insufficient documentation

## 2014-10-04 DIAGNOSIS — Z79899 Other long term (current) drug therapy: Secondary | ICD-10-CM | POA: Insufficient documentation

## 2014-10-04 DIAGNOSIS — R1013 Epigastric pain: Secondary | ICD-10-CM

## 2014-10-04 DIAGNOSIS — K449 Diaphragmatic hernia without obstruction or gangrene: Secondary | ICD-10-CM | POA: Insufficient documentation

## 2014-10-04 DIAGNOSIS — K2970 Gastritis, unspecified, without bleeding: Secondary | ICD-10-CM

## 2014-10-04 DIAGNOSIS — K2950 Unspecified chronic gastritis without bleeding: Secondary | ICD-10-CM | POA: Insufficient documentation

## 2014-10-04 DIAGNOSIS — Z8543 Personal history of malignant neoplasm of ovary: Secondary | ICD-10-CM | POA: Insufficient documentation

## 2014-10-04 DIAGNOSIS — K222 Esophageal obstruction: Secondary | ICD-10-CM | POA: Insufficient documentation

## 2014-10-04 DIAGNOSIS — K921 Melena: Secondary | ICD-10-CM | POA: Insufficient documentation

## 2014-10-04 DIAGNOSIS — K317 Polyp of stomach and duodenum: Secondary | ICD-10-CM | POA: Insufficient documentation

## 2014-10-04 DIAGNOSIS — R112 Nausea with vomiting, unspecified: Secondary | ICD-10-CM | POA: Insufficient documentation

## 2014-10-04 DIAGNOSIS — F319 Bipolar disorder, unspecified: Secondary | ICD-10-CM | POA: Insufficient documentation

## 2014-10-04 HISTORY — DX: Adverse effect of unspecified anesthetic, initial encounter: T41.45XA

## 2014-10-04 HISTORY — DX: Other complications of anesthesia, initial encounter: T88.59XA

## 2014-10-04 HISTORY — PX: ESOPHAGOGASTRODUODENOSCOPY: SHX5428

## 2014-10-04 LAB — CBC
HCT: 36.3 % (ref 36.0–46.0)
Hemoglobin: 12.4 g/dL (ref 12.0–15.0)
MCH: 31.4 pg (ref 26.0–34.0)
MCHC: 34.2 g/dL (ref 30.0–36.0)
MCV: 91.9 fL (ref 78.0–100.0)
Platelets: 191 10*3/uL (ref 150–400)
RBC: 3.95 MIL/uL (ref 3.87–5.11)
RDW: 12.1 % (ref 11.5–15.5)
WBC: 6.1 10*3/uL (ref 4.0–10.5)

## 2014-10-04 SURGERY — EGD (ESOPHAGOGASTRODUODENOSCOPY)
Anesthesia: Moderate Sedation

## 2014-10-04 MED ORDER — MEPERIDINE HCL 100 MG/ML IJ SOLN
INTRAMUSCULAR | Status: DC | PRN
  Administered 2014-10-04 (×2): 25 mg via INTRAVENOUS

## 2014-10-04 MED ORDER — LIDOCAINE VISCOUS 2 % MT SOLN
OROMUCOSAL | Status: DC | PRN
Start: 2014-10-04 — End: 2014-10-04
  Administered 2014-10-04: 3 mL via OROMUCOSAL

## 2014-10-04 MED ORDER — STERILE WATER FOR IRRIGATION IR SOLN
Status: DC | PRN
  Administered 2014-10-04: 15:00:00

## 2014-10-04 MED ORDER — MEPERIDINE HCL 100 MG/ML IJ SOLN
INTRAMUSCULAR | Status: DC
Start: 2014-10-04 — End: 2014-10-04
  Filled 2014-10-04: qty 2

## 2014-10-04 MED ORDER — LIDOCAINE VISCOUS 2 % MT SOLN
OROMUCOSAL | Status: AC
  Filled 2014-10-04: qty 15

## 2014-10-04 MED ORDER — MIDAZOLAM HCL 5 MG/5ML IJ SOLN
INTRAMUSCULAR | Status: AC
  Filled 2014-10-04: qty 10

## 2014-10-04 MED ORDER — PROMETHAZINE HCL 25 MG/ML IJ SOLN
INTRAMUSCULAR | Status: AC
  Filled 2014-10-04: qty 1

## 2014-10-04 MED ORDER — MIDAZOLAM HCL 5 MG/5ML IJ SOLN
INTRAMUSCULAR | Status: DC | PRN
  Administered 2014-10-04 (×2): 2 mg via INTRAVENOUS

## 2014-10-04 MED ORDER — PROMETHAZINE HCL 25 MG/ML IJ SOLN
25.0000 mg | Freq: Once | INTRAMUSCULAR | Status: AC
  Administered 2014-10-04: 25 mg via INTRAVENOUS

## 2014-10-04 MED ORDER — SODIUM CHLORIDE 0.9 % IV SOLN
INTRAVENOUS | Status: DC
  Administered 2014-10-04: 1000 mL via INTRAVENOUS

## 2014-10-04 NOTE — H&P (View-Only) (Signed)
Subjective:    Patient ID: Ruth Duncan, female    DOB: 05/23/1970, 44 y.o.   MRN: [id number] KAPLAN,KRISTEN, PA-C   HPI HAD 1 WEEK OF UNCONTROLLED HEARTBURN AND VOMITING. THEN WOKE UP ABOUT 0530 IN THE AM. SEVERE BURNING CHEST PAIN LIKE AN ELEPHANT ON HER CHEST. NO ABDOMINAL PAIN. HAD PAIN WITH SWALLOWING AND DECREASED APPETITE. DECREASEDTHREW UP AND SAW LOTS OF BRB. CONTINUED TO VOMIT BRBR ON AND OFF FOR 5 DAYS. HAPPENED 1 MO AGO. BEEN UNDER STRESS. TAKING PROTONIX ONCE A DAY. LAST VOMITED: 1 WEEK AGO(LAST BLOOD 1 MO AGO). NAUSEA: QUITE A BIT OFF AND ON. LAST BLACK TARRY STOOL: 1 MO AGO. BMs: DIARRHEA THEN CONSTIPATED(#1). HEARTBURN: CONTROLLED RIGHT NOW. NO PILLS GETTING STUCK OR NEW MEDS. HAD PROBLEMS SWALLOWING DURING THE WEEK IF HEMATEMESIS BUT NOT NOW. LAST EGD 2010. NO ETOH, ASA, OR NSAIDs. QUIT SMOKING 2 YRS AGO. WEIGHT LOSS: NO.  PT DENIES FEVER, CHILLS, HEMATOCHEZIA, SHORTNESS OF BREATH, CHANGE IN BOWEL IN HABITS, abdominal pain, problems swallowing, OR problems with sedation.  Past Medical History  Diagnosis Date  . Diverticulitis   . Tubular adenoma 2010  . Ovarian cancer 2010  . Surgical menopause 2010  . Bipolar disorder   . Chronic abdominal pain     hx of IBS-D, ? bile salt-induced diarrhea  . Chronic eczema of hand 2012    BILATERAL ON MTX SINCE 2014   Past Surgical History  Procedure Laterality Date  . Appendectomy  2004  . Abdominal hysterectomy  2010  . Cesarean section  1998  . Subtotal colectomy  2011  . Esophagogastroduodenoscopy  05/06/2008    RXV:QMGQQP esophagus without evidence of Barrett's, mass, erosion, ulceration or stricture/ Hiatal hernia/ Normal duodenal bulb and second portion of the duodenum  . Tubal ligation    . Colonoscopy  12/2008    tubular adenomas, negative microscopic colitis. Due for Flex Sig Dec 2015  . Flexible sigmoidoscopy N/A 02/18/2014    SLF: Normal small bowel. formed stool at anastomosis. Stool cleared with irrigation. One  superfical erosion at the anastomosis. otherwise normal. normal rectal mucosa   No Known Allergies  Current Outpatient Prescriptions  Medication Sig Dispense Refill  . ALPRAZolam (XANAX) 0.5 MG tablet Take 1 tablet (0.5 mg total) by mouth at bedtime.    [name] amphetamine-dextroamphetamine (ADDERALL) 10 MG tablet Take 1 tablet (10 mg total) by mouth daily.    . benztropine (COGENTIN) 0.5 MG tablet TAKE 1 TABLET (0.5 MG TOTAL) BY MOUTH AT BEDTIME.    [name] escitalopram (LEXAPRO) 10 MG tablet TAKE 1 TABLET (10 MG TOTAL) BY MOUTH DAILY.    [name] ESTRACE VAGINAL 0.1 MG/GM vaginal cream Place 1 Applicatorful vaginally at bedtime.     . haloperidol (HALDOL) 2 MG tablet Take 1 tab in am and 3 at bed time    . lamoTRIgine (LAMICTAL) 200 MG tablet Take 1 tablet (200 mg total) by mouth daily.    . methotrexate (RHEUMATREX) 2.5 MG tablet Take 12.5 mg by mouth once a week. thursday    . nitrofurantoin (MACRODANTIN) 50 MG capsule Take 50 mg by mouth at bedtime.     . pantoprazole (PROTONIX) 40 MG tablet Take 1 tablet (40 mg total) by mouth 2 (two) times daily.    . valACYclovir (VALTREX) 500 MG tablet Take 500 mg by mouth once a week.      Review of Systems PER HPI OTHERWISE ALL SYSTEMS ARE NEGATIVE.    Objective:   Physical Exam  Assessment & Plan:

## 2014-10-04 NOTE — Telephone Encounter (Signed)
PLEASE CALL PT. HER BLOOD COUNT IS NORMAL. IT HAS NOT CHANGED SINCE APR 2015.

## 2014-10-04 NOTE — Op Note (Signed)
[facility] [address] [place], [address]   ENDOSCOPY PROCEDURE REPORT  PATIENT: Ruth Duncan, Ruth Duncan  MR#: [id number] BIRTHDATE: [birth year] , 62  yrs. old GENDER: female  ENDOSCOPIST: Danie Binder, MD REFERRED BY:   Bing Matter, PA-c  PROCEDURE DATE: Nov 02, 2014 PROCEDURE:   EGD w/ biopsy  INDICATIONS:melena. MEDICATIONS: Demerol 50 mg IV, Versed 4 mg IV, and Promethazine (Phenergan) 25 mg IV TOPICAL ANESTHETIC:   Viscous Xylocaine ASA CLASS:  DESCRIPTION OF PROCEDURE:     Physical exam was performed.  Informed consent was obtained from the patient after explaining the benefits, risks, and alternatives to the procedure.  The patient was connected to the monitor and placed in the left lateral position.  Continuous oxygen was provided by nasal cannula and IV medicine administered through an indwelling cannula.  After administration of sedation, the patients esophagus was intubated and the EG-[id number] ([id number])  endoscope was advanced under direct visualization to the second portion of the duodenum.  The scope was removed slowly by carefully examining the color, texture, anatomy, and integrity of the mucosa on the way out.  The patient was recovered in endoscopy and discharged home in satisfactory condition.  Estimated blood loss is zero unless otherwise noted in this procedure report.    ESOPHAGUS: There was a stricture at the gastroesophageal junction. The stricture was easily traversable.   STOMACH: A small hiatal hernia was noted.   Mild non-erosive gastritis (inflammation) was found in the gastric antrum.  Multiple biopsies were performed using cold forceps.   Multiple small sessile polyps were found in the cardia and gastric fundus.  Multiple biopsies was performed. DUODENUM: The duodenal mucosa showed no abnormalities in the bulb and 2nd part of the duodenum. COMPLICATIONS: There were no immediate complications.  ENDOSCOPIC IMPRESSION: 1.   PATENT  stricture at the gastroesophageal junction 2.   Small hiatal hernia 3.   MILD Non-erosive gastritis -NO SOURCE FOR MELENA IDENIFIED 4.   Multiple small GASTRIC polyps  RECOMMENDATIONS: COMPLETE CAPSULE ENDOSCOPY WED OCT 5. AVOID FOOD THAT TRIGGERS REFLUX. PROTONIX 30 MINUTES PRIOR TO MEALS TWICE DAILY. FOLLOW UP IN 4 MOS.  REPEAT EXAM: eSigned:  Danie Binder, MD 2014-11-02 3:35 PMevised  CPT CODES: ICD CODES:  The ICD and CPT codes recommended by this software are interpretations from the data that the clinical staff has captured with the software.  The verification of the translation of this report to the ICD and CPT codes and modifiers is the sole responsibility of the health care institution and practicing physician where this report was generated.  [employer] will not be held responsible for the validity of the ICD and CPT codes included on this report.  AMA assumes no liability for data contained or not contained herein. CPT is a Designer, television/film set of the [employer].

## 2014-10-04 NOTE — Interval H&P Note (Signed)
History and Physical Interval Note:  10/04/2014 2:46 PM  Ruth Duncan  has presented today for surgery, with the diagnosis of dyspepsia, hematoemesis  The various methods of treatment have been discussed with the patient and family. After consideration of risks, benefits and other options for treatment, the patient has consented to  Procedure(s) with comments: ESOPHAGOGASTRODUODENOSCOPY (EGD) (N/A) - 1500 as a surgical intervention .  The patient's history has been reviewed, patient examined, no change in status, stable for surgery.  I have reviewed the patient's chart and labs.  Questions were answered to the patient's satisfaction.     [employer]

## 2014-10-04 NOTE — Discharge Instructions (Signed)
You have mild gastritis and stomach polyps. NO SOURCE FOR YOUR BLACK TARRY STOOLS WAS IDENTIFIED. I biopsied your stomach.   YOU NEED TO COMPLETE CAPSULE ENDOSCOPY WED OCT 5.  AVOID FOOD THAT TRIGGERS REFLUX. SEE INFO BELOW.   CONTINUE PROTONIX. TAKE 30 MINUTES PRIOR TO MEALS TWICE DAILY.  FOLLOW UP IN 4 MOS.  UPPER ENDOSCOPY AFTER CARE Read the instructions outlined below and refer to this sheet in the next week. These discharge instructions provide you with general information on caring for yourself after you leave the hospital. While your treatment has been planned according to the most current medical practices available, unavoidable complications occasionally occur. If you have any problems or questions after discharge, call DR. Bladen Umar, [phone].  ACTIVITY  You may resume your regular activity, but move at a slower pace for the next 24 hours.   Take frequent rest periods for the next 24 hours.   Walking will help get rid of the air and reduce the bloated feeling in your belly (abdomen).   No driving for 24 hours (because of the medicine (anesthesia) used during the test).   You may shower.   Do not sign any important legal documents or operate any machinery for 24 hours (because of the anesthesia used during the test).    NUTRITION  Drink plenty of fluids.   You may resume your normal diet as instructed by your doctor.   Begin with a light meal and progress to your normal diet. Heavy or fried foods are harder to digest and may make you feel sick to your stomach (nauseated).   Avoid alcoholic beverages for 24 hours or as instructed.    MEDICATIONS  You may resume your normal medications.   WHAT YOU CAN EXPECT TODAY  Some feelings of bloating in the abdomen.   Passage of more gas than usual.    IF YOU HAD A BIOPSY TAKEN DURING THE UPPER ENDOSCOPY:  Eat a soft diet IF YOU HAVE NAUSEA, BLOATING, ABDOMINAL PAIN, OR VOMITING.    FINDING OUT THE RESULTS OF  YOUR TEST Not all test results are available during your visit. DR. Oneida Alar WILL CALL YOU WITHIN 14 DAYS OF YOUR PROCEDUE WITH YOUR RESULTS. Do not assume everything is normal if you have not heard from DR. Marielle Mantione IN ONE WEEK, CALL HER OFFICE AT [phone].  SEEK IMMEDIATE MEDICAL ATTENTION AND CALL THE OFFICE: [phone] IF:  You have more than a spotting of blood in your stool.   Your belly is swollen (abdominal distention).   You are nauseated or vomiting.   You have a temperature over 101F.   You have abdominal pain or discomfort that is severe or gets worse throughout the day.   Gastritis  Gastritis is an inflammation (the body's way of reacting to injury and/or infection) of the stomach. It is often caused by viral or bacterial (germ) infections. It can also be caused BY ASPIRIN, BC/GOODY POWDER'S, (IBUPROFEN) MOTRIN, OR ALEVE (NAPROXEN), chemicals (including alcohol), SPICY FOODS, and medications. This illness may be associated with generalized malaise (feeling tired, not well), UPPER ABDOMINAL STOMACH cramps, and fever. One common bacterial cause of gastritis is an organism known as H. Pylori. This can be treated with antibiotics.

## 2014-10-07 ENCOUNTER — Telehealth: Payer: Self-pay

## 2014-10-07 ENCOUNTER — Other Ambulatory Visit: Payer: Self-pay

## 2014-10-07 DIAGNOSIS — K921 Melena: Secondary | ICD-10-CM

## 2014-10-07 DIAGNOSIS — K922 Gastrointestinal hemorrhage, unspecified: Secondary | ICD-10-CM

## 2014-10-07 NOTE — Telephone Encounter (Signed)
-----   Message from Danie Binder, MD sent at 10/04/2014  3:33 PM EDT ----- SCHEDULE FOR GIVENS WED OCT 5, DX: MELENAOBSCURE GI BLEED.

## 2014-10-07 NOTE — Telephone Encounter (Signed)
REVIEWED-NO ADDITIONAL RECOMMENDATIONS. 

## 2014-10-07 NOTE — Telephone Encounter (Signed)
Pt is aware.  

## 2014-10-07 NOTE — Telephone Encounter (Signed)
  Called pt and she requested to have her Givens on 10/22/2014. Pt states she has a lot going on right now and will be out of town next week. Mailed instructions. NO PA is required for [facility]

## 2014-10-09 ENCOUNTER — Telehealth: Payer: Self-pay | Admitting: Gastroenterology

## 2014-10-09 ENCOUNTER — Encounter (HOSPITAL_COMMUNITY): Payer: Self-pay | Admitting: Gastroenterology

## 2014-10-09 DIAGNOSIS — K921 Melena: Secondary | ICD-10-CM | POA: Insufficient documentation

## 2014-10-09 DIAGNOSIS — Z1211 Encounter for screening for malignant neoplasm of colon: Secondary | ICD-10-CM

## 2014-10-09 NOTE — Telephone Encounter (Signed)
Please call pt. HER stomach Bx shows CHRONIC gastritis AND BENIGN STOMACH POLYPS.   COMPLETE CAPSULE ENDOSCOPY .  AVOID FOOD THAT TRIGGERS REFLUX.    CONTINUE PROTONIX. TAKE 30 MINUTES PRIOR TO MEALS TWICE DAILY.  FOLLOW UP IN 4 MOS E30 SLF MELENA/GASTRITIS.

## 2014-10-10 NOTE — Telephone Encounter (Signed)
REMINDER IN EPIC °

## 2014-10-10 NOTE — Telephone Encounter (Signed)
LMOM to call.

## 2014-10-10 NOTE — Telephone Encounter (Signed)
Pt is aware.  

## 2014-10-21 ENCOUNTER — Telehealth: Payer: Self-pay

## 2014-10-21 MED ORDER — SODIUM CHLORIDE 0.9 % IJ SOLN
INTRAMUSCULAR | Status: AC
  Filled 2014-10-21: qty 500

## 2014-10-21 MED ORDER — SODIUM CHLORIDE 0.9 % IJ SOLN
INTRAMUSCULAR | Status: AC
  Filled 2014-10-21: qty 30

## 2014-10-21 NOTE — Telephone Encounter (Signed)
Pt called office this morning and states she needs to cancel her Givens capsule study set for 10/22/2014. Pt states she just got back from [place] on Sunday and that she doesn't feel well. States she will call back to reschedule

## 2014-10-22 ENCOUNTER — Ambulatory Visit (HOSPITAL_COMMUNITY): Admission: RE | Admit: 2014-10-22 | Payer: Medicare Other | Source: Ambulatory Visit | Admitting: Gastroenterology

## 2014-10-22 ENCOUNTER — Encounter (HOSPITAL_COMMUNITY): Admission: RE | Payer: Self-pay | Source: Ambulatory Visit

## 2014-10-22 SURGERY — IMAGING PROCEDURE, GI TRACT, INTRALUMINAL, VIA CAPSULE

## 2014-10-28 ENCOUNTER — Ambulatory Visit (HOSPITAL_COMMUNITY): Payer: Self-pay | Admitting: Psychology

## 2014-11-04 ENCOUNTER — Encounter (HOSPITAL_COMMUNITY): Payer: Self-pay | Admitting: Psychiatry

## 2014-11-04 ENCOUNTER — Ambulatory Visit (INDEPENDENT_AMBULATORY_CARE_PROVIDER_SITE_OTHER): Payer: Medicare Other | Admitting: Psychiatry

## 2014-11-04 VITALS — BP 123/75 | HR 71

## 2014-11-04 DIAGNOSIS — F411 Generalized anxiety disorder: Secondary | ICD-10-CM

## 2014-11-04 DIAGNOSIS — F251 Schizoaffective disorder, depressive type: Secondary | ICD-10-CM

## 2014-11-04 DIAGNOSIS — F909 Attention-deficit hyperactivity disorder, unspecified type: Secondary | ICD-10-CM | POA: Diagnosis not present

## 2014-11-04 DIAGNOSIS — F988 Other specified behavioral and emotional disorders with onset usually occurring in childhood and adolescence: Secondary | ICD-10-CM

## 2014-11-04 MED ORDER — AMPHETAMINE-DEXTROAMPHETAMINE 10 MG PO TABS: 10.0000 mg | ORAL_TABLET | Freq: Every day | ORAL | Status: DC

## 2014-11-04 MED ORDER — ALPRAZOLAM 0.5 MG PO TABS: 0.5000 mg | ORAL_TABLET | Freq: Every day | ORAL | Status: DC

## 2014-11-04 MED ORDER — HALOPERIDOL 2 MG PO TABS: ORAL_TABLET | ORAL | Status: DC

## 2014-11-04 MED ORDER — ESCITALOPRAM OXALATE 10 MG PO TABS: ORAL_TABLET | ORAL | Status: DC

## 2014-11-04 MED ORDER — LAMOTRIGINE 150 MG PO TABS: 150.0000 mg | ORAL_TABLET | Freq: Every day | ORAL | Status: DC

## 2014-11-04 NOTE — Progress Notes (Signed)
[facility] [phone] Progress Note  Ruth Duncan [id number] 44 y.o.  Chief Complaint:    I like increase Lamictal.  I'm not very agitated.  But I'm is still very upset on my husband.  We are still living together.                  History of Present Illness:  Graceyn came for her follow-up appointment.   On her last visit I increased Lamictal 200 mg.  She is doing much better since the dose increase.  She is still upset on her husband who has given STD and cheated on her.  She is unable to see Dr. [name] but now she had appointment on seventh.  She visited [place] with her best friend and she had a good time.  She admitted that she is living with her husband who is now changed for better however she still have trust issues.  She sleeping good.  She denies any shakes or tremors.  However she admitted lately she has been noticed itching and rash at her back.  She is not sure what causing it.  She mentioned it is not getting worse but also not getting better.  She does not want to discontinue Lamictal when I spoke that Lamictal may cause a rash.  She believe Lamictal is helping her.  However after some encouragement she agreed to cut down the dose and she will watch carefully her rash.  Her rash did not get better and she will call her primary care physician for checkup.  I reminded that Lamictal rash is dangerous and if her rash is caused by Lamictal and she need to stop immediately.  Patient acknowledged and agreed.  Overall she is sleeping better.  She denies any hallucination or any paranoia.  She likes her current psychiatric medication.  Her attention and focus is good.  She is able to do multitasking.  Recently she had an endoscopy and she has not scheduled appointment to see her GI physician for follow-up.  Patient denies drinking or using any illegal substances.  Her appetite is okay.  She is very sensitive about her weight .  Today she refused to go on scale.  Her energy level is good.   She denies any panic attack or any aggressive behavior.  Suicidal Ideation: No Plan Formed: No Patient has means to carry out plan: No  Homicidal Ideation: No Plan Formed: No Patient has means to carry out plan: No  Review of Systems  Cardiovascular: Negative for chest pain and palpitations.  Musculoskeletal: Negative.   Skin: Positive for itching and rash.  Neurological: Positive for dizziness. Negative for tremors and headaches.  Psychiatric/Behavioral: Negative for suicidal ideas and substance abuse.   Psychiatric: Agitation: No Hallucination: No Depressed Mood: No Insomnia: No Hypersomnia: No Altered Concentration: No Feels Worthless: No Grandiose Ideas: No Belief In Special Powers: No New/Increased Substance Abuse: No Compulsions: No  Neurologic: Headache: No Seizure: No Paresthesias: No  Past Medical History:  Her primary care physician is at [facility]. She has history of diverticulitis, colon cancer an ovarian cancer.   Outpatient Encounter Prescriptions as of 11/04/2014  Medication Sig  . ALPRAZolam (XANAX) 0.5 MG tablet Take 1 tablet (0.5 mg total) by mouth at bedtime.  . [DISCONTINUED] ALPRAZolam (XANAX) 0.5 MG tablet Take 1 tablet (0.5 mg total) by mouth at bedtime.  [name] amphetamine-dextroamphetamine (ADDERALL) 10 MG tablet Take 1 tablet (10 mg total) by mouth daily.  . benztropine (COGENTIN)  0.5 MG tablet TAKE 1 TABLET (0.5 MG TOTAL) BY MOUTH AT BEDTIME.  [name] escitalopram (LEXAPRO) 10 MG tablet TAKE 1 TABLET (10 MG TOTAL) BY MOUTH DAILY.  [name] ESTRACE VAGINAL 0.1 MG/GM vaginal cream Place 1 Applicatorful vaginally at bedtime.   . haloperidol (HALDOL) 2 MG tablet Take 1 tab in am and 3 at bed time  . lamoTRIgine (LAMICTAL) 150 MG tablet Take 1 tablet (150 mg total) by mouth daily.  . methotrexate (RHEUMATREX) 2.5 MG tablet Take 12.5 mg by mouth once a week. thursday  . nitrofurantoin (MACRODANTIN) 50 MG capsule Take 50 mg by mouth at bedtime.    . pantoprazole (PROTONIX) 40 MG tablet 1 PO 30 MINS BEFORE BREAKFAST AND SUPPER  . valACYclovir (VALTREX) 500 MG tablet Take 500 mg by mouth once a week.   . [DISCONTINUED] amphetamine-dextroamphetamine (ADDERALL) 10 MG tablet Take 1 tablet (10 mg total) by mouth daily.  . [DISCONTINUED] amphetamine-dextroamphetamine (ADDERALL) 10 MG tablet Take 1 tablet (10 mg total) by mouth daily.  . [DISCONTINUED] clonazePAM (KLONOPIN) 1 MG tablet Take 1 mg by mouth 2 (two) times daily as needed for anxiety.  . [DISCONTINUED] escitalopram (LEXAPRO) 10 MG tablet TAKE 1 TABLET (10 MG TOTAL) BY MOUTH DAILY.  . [DISCONTINUED] haloperidol (HALDOL) 2 MG tablet Take 1 tab in am and 3 at bed time  . [DISCONTINUED] lamoTRIgine (LAMICTAL) 200 MG tablet Take 1 tablet (200 mg total) by mouth daily.   No facility-administered encounter medications on file as of 11/04/2014.   Recent Results (from the past 2160 hour(s))  CBC     Status: None   Collection Time: 10/04/14  3:15 PM  Result Value Ref Range   WBC 6.1 4.0 - 10.5 K/uL   RBC 3.95 3.87 - 5.11 MIL/uL   Hemoglobin 12.4 12.0 - 15.0 g/dL   HCT 36.3 36.0 - 46.0 %   MCV 91.9 78.0 - 100.0 fL   MCH 31.4 26.0 - 34.0 pg   MCHC 34.2 30.0 - 36.0 g/dL   RDW 12.1 11.5 - 15.5 %   Platelets 191 150 - 400 K/uL    Past Psychiatric History/Hospitalization(s): Patient has history of inpatient treatment after taking overdose on her medication.  She has history of using drugs and diagnosed with schizoaffective disorder and ADD .  In the past she had tried Abilify, lithium, Vyvanse, Fanpat and Seroquel.  She used to see Dr. Toy Care, [facility].  She is seeing outpatient services in this office since April 2013.  Patient has [facility] IOP program in the past.   Anxiety: Yes Bipolar Disorder: No Depression: Yes Mania: No Psychosis: Yes Schizophrenia: Yes Personality Disorder: No Hospitalization for psychiatric illness: Yes History of Electroconvulsive Shock Therapy: No Prior  Suicide Attempts: No  Physical Exam: Constitutional:  BP 123/75 mmHg  Pulse 71  General Appearance: well nourished  Musculoskeletal: Strength & Muscle Tone: within normal limits Gait & Station: normal Patient leans: N/A  Mental status examination . Patient is casually dressed and fairly groomed.  She is anxious but cooperative.  She maintained good eye contact.  She described her mood  anxious and her affect is labile.  Her speech is fast but clear and coherent.  She denies any auditory or visual hallucination.  She denies any active or passive suicidal thoughts or homicidal thought. There were no paranoia or any delusions.  Her attention and concentration is fair.  Her psychomotor activity is somewhat restless.  Her fund of knowledge is adequate.  She has no tremors,  shakes.  Her cognition is good.  She's alert and oriented 3.  Her insight judgment and impulse control is okay.  Established Problem, Stable/Improving (1), New problem, with additional work up planned, Review of Psycho-Social Stressors (1), Review or order clinical lab tests (1), Review and summation of old records (2), Review of Last Therapy Session (1), Review of Medication Regimen & Side Effects (2) and Review of New Medication or Change in Dosage (2)  Assessment: Axis I: Schizoaffective disorder, generalized anxiety disorder, ADHD combined type  Axis II: Deferred  Axis III: Diverticulitis  Colon cancer  Ovarian cancer  Surgical menopause    Plan:  I reviewed records from her GI physician including recent blood work results.  I encouraged to see her GI doctor for follow-up were discussed the endoscopy results.  I also encouraged to see Dr. [name] for counseling.  She has appointment on November 7.  I recommended to reduce dose of Lamictal since patient has itching and rash .  She does not want to lower the dose because it did help her but I explained if it is related to Lamictal but it could be dangerous.  Patient  will see her primary care physician if rash does not get better with low-dose Lamictal.  I strongly encouraged to discontinue Lamictal if rash get worse .  Discussed Stevens-Johnson syndrome related to Lamictal.  Recommended to watch her calorie intake and regular exercise for further weight gain.  I will continue Xanax 0.5 mg at bedtime , Adderall 10 mg daily, Haldol 4 mg in the morning and 6 mg at bedtime and Lexapro 10 mg daily.  Discussed polypharmacy in detail.  Discuss safety plan that anytime having active suicidal thoughts or homicidal thought that she need to call 911 or go to the local emergency room.  Time spent 25 minutes.  More than 50% of the time spent in psychoeducation, constant and coronation of care.  Patient was given enough time to ask any questions related to her diagnosis, treatment and medication.  Follow-up in 3 months.    ARFEEN,SYED T., MD 11/04/2014

## 2014-11-11 ENCOUNTER — Ambulatory Visit (INDEPENDENT_AMBULATORY_CARE_PROVIDER_SITE_OTHER): Payer: Medicare Other | Admitting: Psychology

## 2014-11-11 DIAGNOSIS — F411 Generalized anxiety disorder: Secondary | ICD-10-CM | POA: Diagnosis not present

## 2014-11-11 DIAGNOSIS — F251 Schizoaffective disorder, depressive type: Secondary | ICD-10-CM

## 2014-11-14 ENCOUNTER — Other Ambulatory Visit: Payer: Self-pay

## 2014-11-14 DIAGNOSIS — Z1231 Encounter for screening mammogram for malignant neoplasm of breast: Secondary | ICD-10-CM

## 2014-11-15 ENCOUNTER — Encounter (HOSPITAL_COMMUNITY): Payer: Self-pay | Admitting: Psychology

## 2014-11-15 DIAGNOSIS — R21 Rash and other nonspecific skin eruption: Secondary | ICD-10-CM | POA: Diagnosis not present

## 2014-11-15 NOTE — Progress Notes (Signed)
Patient:  Ruth Duncan   DOB: [birth year]  MR Number: [id number]  Location: [facility] [address] [place] [address] Dept: [phone]  Start: 1 PM End: 2 PM  Provider/Observer:     Edgardo Roys PSYD  Chief Complaint:      Chief Complaint  Patient presents with  . Paranoid  . Anxiety  . Depression  . Agitation    Reason For Service:     The patient is a 44 year old female who has been followed by the outpatient facility in [place] since April 2013. She has been diagnosed with schizoaffective disorder as well as previous diagnoses of attention deficit disorder. I reviewed the patient's history and it is consistent with a mood disorder along with hallucinations primarily auditory. The patient describes episodes of significant manic events and agitation as well as severe depressive events. She reports that she has developed suicidal ideation during significant depressive episodes. 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 The patient also has a history of alcohol abuse as well but she has been free from alcohol abuse for quite some time. The patient also quit smoking. The patient reports that she has had these symptoms since she was about 12 or 13 years as far she can remember. She has been treated in the past by other psychiatrists. She has had poor response to some medications and good response to other medications. However, she is trying to cope with guilty feelings about one of her children having autism thinking that it is her fault and feeling bad about her condition and how it affects her husband in the way his family use her.   Interventions Strategy:  Cognitive/behavioral psychotherapeutic interventions  Participation Level:   Active  Participation  Quality:  Appropriate      Behavioral Observation:  Well Groomed, Alert, and Appropriate and Tearful.   Current Psychosocial Factors: The patient reports that she  Has had a major stressful event recently. She reports that she developed symptoms consistent with a bacterial related STD and gave her husband multiple chances to tell her that he had been engaged in sexual activity with other women. He continued to deny for an extended period of time. She ended up going into her Dr. And being evaluated. She divorced her husband to go in there with her. It wasn't until the results  actuallycame back they confirmed  And STD that he admitted to having sex with a prostitute. The patient reports that this is something that has happened before in the past. She does not know whether she will be able to overcome on this building trust with her husband in the future. She is looking at a divorce but feels overwhelmed by this prospect.  Content of Session:   Reviewed current symptoms and work on therapeutic interventions about building better coping skills and strategies to deal with her underlying mood disorder. We looked at some foundational issues such as her sleep pattern, dietary pattern, and physical activity patterns.  Current Status:   The patient reports that she has not had any full-blown manic or depressive events recently  But has been dealing with major stress related to infidelity from her husband. She contracted an STD from him and this was the only way that she was able to prove or verify that he  Had been engaging  in sexual activity outside of marriage. He reported that this was a impulsive encounter with a prostitute and denied any alcohol use leading up to this. However, this is not consistent with this pattern in the past. She is really trying to figure how to cope with this..  Target Goals:   Target goals include reducing intensity, severity, and duration of her mood swings related to both manic  episodes as well as depressive episodes.  Last Reviewed:    11/ 07/2014  Goals Addressed Today:    Goals addressed today had to do with building better coping skills around managing her moods.  Impression/Diagnosis:   The patient describes a history of mood disorder that included auditory hallucinations dating back to age. She reports that these symptoms have been treated to varying degrees to the years. She reports that she has had difficulty with substance abuse related to alcohol as a means to self medicate herself and has also gotten herself in trouble during manic episodes. She also has events where she developed suicidal ideation during times of significant depression.  Diagnosis:    Axis I: Schizoaffective disorder, depressive type ([place])  Generalized anxiety disorder   Riad Wagley R, PsyD 11/15/2014

## 2014-11-20 ENCOUNTER — Ambulatory Visit (HOSPITAL_COMMUNITY): Payer: Self-pay | Admitting: Psychology

## 2014-12-05 ENCOUNTER — Ambulatory Visit
Admission: RE | Admit: 2014-12-05 | Discharge: 2014-12-05 | Disposition: A | Discharge status: Home / Self Care | Payer: Medicare Other | Source: Ambulatory Visit

## 2014-12-05 DIAGNOSIS — R21 Rash and other nonspecific skin eruption: Secondary | ICD-10-CM | POA: Diagnosis not present

## 2014-12-05 DIAGNOSIS — Z1231 Encounter for screening mammogram for malignant neoplasm of breast: Secondary | ICD-10-CM

## 2014-12-13 ENCOUNTER — Ambulatory Visit (HOSPITAL_COMMUNITY): Payer: Self-pay | Admitting: Psychology

## 2014-12-18 ENCOUNTER — Encounter: Payer: Self-pay | Admitting: Gastroenterology

## 2014-12-23 ENCOUNTER — Ambulatory Visit (HOSPITAL_COMMUNITY): Payer: Self-pay | Admitting: Psychology

## 2015-01-17 ENCOUNTER — Telehealth (HOSPITAL_COMMUNITY): Payer: Self-pay

## 2015-01-17 NOTE — Telephone Encounter (Signed)
S/w Dr Adele Schilder and he will refill this at patients f/u on 1/30. I called the patient and left voicemail letting her know that she has a f/u on 1/30 and she will get refill at that time.

## 2015-01-17 NOTE — Telephone Encounter (Deleted)
Received a refill request from [facility] for Escitalopram 10mg  tabs 1 po qd. Patient last got refill on 11/04/14, she has a f/u appointment on 02/03/2015. Is it okay to refill this medication?

## 2015-01-20 ENCOUNTER — Encounter: Payer: Self-pay | Admitting: Gastroenterology

## 2015-01-22 ENCOUNTER — Telehealth (HOSPITAL_COMMUNITY): Payer: Self-pay

## 2015-01-22 NOTE — Telephone Encounter (Signed)
Received a fax from patients pharmacy [facility] for Escitalopram 10mg  1 po qd. Last filled on 11/04/2014 and patient has a f/u scheduled on 02/03/2015 - Is it okay to refill Escitalopram? Please review and advise, thank you

## 2015-01-24 ENCOUNTER — Other Ambulatory Visit (HOSPITAL_COMMUNITY): Payer: Self-pay | Admitting: Psychiatry

## 2015-01-24 NOTE — Telephone Encounter (Signed)
Patient should have enough medication until seen.

## 2015-01-27 NOTE — Telephone Encounter (Signed)
Called patient and lvm that she would get refills at her next office visit, 02/03/2015.

## 2015-02-03 ENCOUNTER — Encounter (HOSPITAL_COMMUNITY): Payer: Self-pay | Admitting: Psychiatry

## 2015-02-03 ENCOUNTER — Ambulatory Visit (INDEPENDENT_AMBULATORY_CARE_PROVIDER_SITE_OTHER): Payer: Medicare Other | Admitting: Psychiatry

## 2015-02-03 VITALS — BP 123/82 | HR 71 | Ht 63.0 in | Wt 126.2 lb

## 2015-02-03 DIAGNOSIS — F251 Schizoaffective disorder, depressive type: Secondary | ICD-10-CM

## 2015-02-03 DIAGNOSIS — F988 Other specified behavioral and emotional disorders with onset usually occurring in childhood and adolescence: Secondary | ICD-10-CM

## 2015-02-03 DIAGNOSIS — F411 Generalized anxiety disorder: Secondary | ICD-10-CM | POA: Diagnosis not present

## 2015-02-03 DIAGNOSIS — F909 Attention-deficit hyperactivity disorder, unspecified type: Secondary | ICD-10-CM | POA: Diagnosis not present

## 2015-02-03 MED ORDER — ESCITALOPRAM OXALATE 10 MG PO TABS: ORAL_TABLET | ORAL | Status: DC

## 2015-02-03 MED ORDER — AMPHETAMINE-DEXTROAMPHETAMINE 10 MG PO TABS: 10.0000 mg | ORAL_TABLET | Freq: Every day | ORAL | Status: DC

## 2015-02-03 MED ORDER — ALPRAZOLAM 0.5 MG PO TABS: 0.5000 mg | ORAL_TABLET | Freq: Every day | ORAL | Status: DC

## 2015-02-03 MED ORDER — HALOPERIDOL 2 MG PO TABS
ORAL_TABLET | ORAL | Status: DC
Start: 2015-02-03 — End: 2015-05-05

## 2015-02-03 MED ORDER — LAMOTRIGINE 150 MG PO TABS: 150.0000 mg | ORAL_TABLET | Freq: Every day | ORAL | Status: DC

## 2015-02-03 NOTE — Progress Notes (Signed)
[facility] [phone] Progress Note  [name] [id number] 45 y.o.  Chief Complaint:  I am very stressful.  I'm gaining weight.  My father died on December 30, 2022.    History of Present Illness:  Ruth Duncan came for her follow-up appointment.  She has been under a lot of stress because her father died just after Thanksgiving.  She was very sad and upset.  She has not seen Dr. [name] in the past 2 months.  She endorse Christmas was very quite.  She continues to have very tense relationship with her husband who cheated on her.  Patient told husband is not allowed to leave the town.  Patient told that she cut down her Lamictal to cut she was having itching.  She noticed irritability, frustration, anger episodes and mood swings.  She sleeping good.  She is very concerned about her weight.  She gained 3 pounds since the last visit.  She likes her medication but she still have episodic hallucination and paranoia.  She was taking Lamictal 150 but dose was reduced because she was itching.  She is taking Adderall, Xanax as needed, Haldol, Lexapro.  She rarely takes Cogentin.  Currently she denies any rash itching or any EPS.  Her sleep is fair.  Her energy level is okay.  She denies any suicidal thoughts or homicidal thought.  Her attention and focus is good.  She denies drinking or using any illegal substances.  She is not happy that she gained weight.  Suicidal Ideation: No Plan Formed: No Patient has means to carry out plan: No  Homicidal Ideation: No Plan Formed: No Patient has means to carry out plan: No  Review of Systems  Cardiovascular: Negative for chest pain and palpitations.  Musculoskeletal: Negative.   Neurological: Negative for tremors and headaches.  Psychiatric/Behavioral: Negative for suicidal ideas and substance abuse. The patient is nervous/anxious.    Psychiatric: Agitation: No Hallucination: No Depressed Mood: No Insomnia: No Hypersomnia: No Altered Concentration:  No Feels Worthless: No Grandiose Ideas: No Belief In Special Powers: No New/Increased Substance Abuse: No Compulsions: No  Neurologic: Headache: No Seizure: No Paresthesias: No  Past Medical History:  Her primary care physician is at [facility]. She has history of diverticulitis, colon cancer an ovarian cancer.   Outpatient Encounter Prescriptions as of 02/03/2015  Medication Sig  . ALPRAZolam (XANAX) 0.5 MG tablet Take 1 tablet (0.5 mg total) by mouth at bedtime.  [name] amphetamine-dextroamphetamine (ADDERALL) 10 MG tablet Take 1 tablet (10 mg total) by mouth daily.  . benztropine (COGENTIN) 0.5 MG tablet TAKE 1 TABLET (0.5 MG TOTAL) BY MOUTH AT BEDTIME.  [name] escitalopram (LEXAPRO) 10 MG tablet TAKE 1 TABLET (10 MG TOTAL) BY MOUTH DAILY.  [name] ESTRACE VAGINAL 0.1 MG/GM vaginal cream Place 1 Applicatorful vaginally at bedtime.   . haloperidol (HALDOL) 2 MG tablet Take 1 tab in am and 3 at bed time  . lamoTRIgine (LAMICTAL) 150 MG tablet Take 1 tablet (150 mg total) by mouth daily.  . methotrexate (RHEUMATREX) 2.5 MG tablet Take 12.5 mg by mouth once a week. thursday  . nitrofurantoin (MACRODANTIN) 50 MG capsule Take 50 mg by mouth at bedtime.   . pantoprazole (PROTONIX) 40 MG tablet 1 PO 30 MINS BEFORE BREAKFAST AND SUPPER  . valACYclovir (VALTREX) 500 MG tablet Take 500 mg by mouth once a week.   . [DISCONTINUED] ALPRAZolam (XANAX) 0.5 MG tablet Take 1 tablet (0.5 mg total) by mouth at bedtime.  . [DISCONTINUED] amphetamine-dextroamphetamine (  ADDERALL) 10 MG tablet Take 1 tablet (10 mg total) by mouth daily.  . [DISCONTINUED] escitalopram (LEXAPRO) 10 MG tablet TAKE 1 TABLET (10 MG TOTAL) BY MOUTH DAILY.  . [DISCONTINUED] haloperidol (HALDOL) 2 MG tablet Take 1 tab in am and 3 at bed time  . [DISCONTINUED] lamoTRIgine (LAMICTAL) 150 MG tablet Take 1 tablet (150 mg total) by mouth daily.   No facility-administered encounter medications on file as of 02/03/2015.   No  results found for this or any previous visit (from the past 2160 hour(s)).  Past Psychiatric History/Hospitalization(s): Patient has history of inpatient treatment after taking overdose on her medication.  She has history of using drugs and diagnosed with schizoaffective disorder and ADD .  In the past she had tried Abilify, lithium, Vyvanse, Fanpat and Seroquel.  She used to see Dr. Toy Care, [facility].  She is seeing outpatient services in this office since April 2013.  Patient has [facility] IOP program in the past.   Anxiety: Yes Bipolar Disorder: No Depression: Yes Mania: No Psychosis: Yes Schizophrenia: Yes Personality Disorder: No Hospitalization for psychiatric illness: Yes History of Electroconvulsive Shock Therapy: No Prior Suicide Attempts: No  Physical Exam: Constitutional:  BP 123/82 mmHg  Pulse 71  Ht 5\' 3"  (1.6 m)  Wt 126 lb 3.2 oz (57.244 kg)  BMI 22.36 kg/m2  General Appearance: well nourished  Musculoskeletal: Strength & Muscle Tone: within normal limits Gait & Station: normal Patient leans: N/A  Mental status examination . Patient is casually dressed and fairly groomed.  She is anxious but cooperative.  She maintained good eye contact.  Her speech is fast, pressured at times.  She described her mood anxious nervous and her affect is labile.  She endorse occasional hallucination and paranoia but denies any active or passive suicidal thoughts or homicidal thought. Her attention and concentration is fair.  Her psychomotor activity is somewhat restless.  Her fund of knowledge is adequate.  She has no tremors, shakes.  Her cognition is good.  She's alert and oriented 3.  Her insight judgment and impulse control is okay.  Established Problem, Stable/Improving (1), Review of Psycho-Social Stressors (1), Review and summation of old records (2), Review of Last Therapy Session (1) and Review of Medication Regimen & Side Effects (2)  Assessment: Axis I: Schizoaffective  disorder, generalized anxiety disorder, ADHD combined type  Axis II: Deferred  Axis III: Diverticulitis  Colon cancer  Ovarian cancer  Surgical menopause    Plan:  I have a long discussion with the patient about her medication.  In the past she had tried Vyvanse but that did not help her.  She does not want to take any medication that cause weight gain.  I recommended to retry Lamictal 150 mg which had helped her in the past .  She wanted to take something for weight loss .  She is only taking Adderall when she does not want to increase because it makes her very numb.  I recommended to talk to her primary care physician about phentermine for weight loss.  Patient agreed with the plan and if it did not work she would consider increasing the Adderall 15 mg a day.  Also encouraged to see Dr. [name] for counseling and therapy.  Discussed medication side effects and benefits.  Is strongly encouraged to call us back if she developed a rash with Lamictal.  I will continue Xanax 0.5 mg daily at bedtime, Adderall 10 mg daily, Haldol 2 mg in the morning and 6  mg at bedtime , Lamictal 150 mg daily and Lexapro 10 mg daily.  Discuss safety plan that anytime having active suicidal thoughts or homicidal thought that she need to call 911 or go to the local emergency room.  Time spent 25 minutes.  More than 50% of the time spent in psychoeducation, constant and coronation of care.  Patient was given enough time to ask any questions related to her diagnosis, treatment and medication.  Follow-up in 3 months.    Zinia Innocent T., MD 02/03/2015

## 2015-02-17 ENCOUNTER — Encounter: Payer: Self-pay | Admitting: Gastroenterology

## 2015-02-17 DIAGNOSIS — Z6825 Body mass index (BMI) 25.0-25.9, adult: Secondary | ICD-10-CM | POA: Diagnosis not present

## 2015-02-17 DIAGNOSIS — R635 Abnormal weight gain: Secondary | ICD-10-CM | POA: Diagnosis not present

## 2015-03-18 DIAGNOSIS — Z6824 Body mass index (BMI) 24.0-24.9, adult: Secondary | ICD-10-CM | POA: Diagnosis not present

## 2015-03-18 DIAGNOSIS — R635 Abnormal weight gain: Secondary | ICD-10-CM | POA: Diagnosis not present

## 2015-04-03 DIAGNOSIS — H04123 Dry eye syndrome of bilateral lacrimal glands: Secondary | ICD-10-CM | POA: Diagnosis not present

## 2015-04-07 DIAGNOSIS — M531 Cervicobrachial syndrome: Secondary | ICD-10-CM | POA: Diagnosis not present

## 2015-04-07 DIAGNOSIS — M9901 Segmental and somatic dysfunction of cervical region: Secondary | ICD-10-CM | POA: Diagnosis not present

## 2015-04-07 DIAGNOSIS — M9903 Segmental and somatic dysfunction of lumbar region: Secondary | ICD-10-CM | POA: Diagnosis not present

## 2015-04-07 DIAGNOSIS — M9905 Segmental and somatic dysfunction of pelvic region: Secondary | ICD-10-CM | POA: Diagnosis not present

## 2015-04-07 DIAGNOSIS — M9904 Segmental and somatic dysfunction of sacral region: Secondary | ICD-10-CM | POA: Diagnosis not present

## 2015-04-07 DIAGNOSIS — M9902 Segmental and somatic dysfunction of thoracic region: Secondary | ICD-10-CM | POA: Diagnosis not present

## 2015-04-08 DIAGNOSIS — M9901 Segmental and somatic dysfunction of cervical region: Secondary | ICD-10-CM | POA: Diagnosis not present

## 2015-04-08 DIAGNOSIS — M531 Cervicobrachial syndrome: Secondary | ICD-10-CM | POA: Diagnosis not present

## 2015-04-08 DIAGNOSIS — M9905 Segmental and somatic dysfunction of pelvic region: Secondary | ICD-10-CM | POA: Diagnosis not present

## 2015-04-08 DIAGNOSIS — M9903 Segmental and somatic dysfunction of lumbar region: Secondary | ICD-10-CM | POA: Diagnosis not present

## 2015-04-08 DIAGNOSIS — M9902 Segmental and somatic dysfunction of thoracic region: Secondary | ICD-10-CM | POA: Diagnosis not present

## 2015-04-08 DIAGNOSIS — M9904 Segmental and somatic dysfunction of sacral region: Secondary | ICD-10-CM | POA: Diagnosis not present

## 2015-04-09 DIAGNOSIS — M9903 Segmental and somatic dysfunction of lumbar region: Secondary | ICD-10-CM | POA: Diagnosis not present

## 2015-04-09 DIAGNOSIS — M9905 Segmental and somatic dysfunction of pelvic region: Secondary | ICD-10-CM | POA: Diagnosis not present

## 2015-04-09 DIAGNOSIS — M9904 Segmental and somatic dysfunction of sacral region: Secondary | ICD-10-CM | POA: Diagnosis not present

## 2015-04-09 DIAGNOSIS — M9901 Segmental and somatic dysfunction of cervical region: Secondary | ICD-10-CM | POA: Diagnosis not present

## 2015-04-09 DIAGNOSIS — M9902 Segmental and somatic dysfunction of thoracic region: Secondary | ICD-10-CM | POA: Diagnosis not present

## 2015-04-09 DIAGNOSIS — M531 Cervicobrachial syndrome: Secondary | ICD-10-CM | POA: Diagnosis not present

## 2015-04-10 DIAGNOSIS — M9904 Segmental and somatic dysfunction of sacral region: Secondary | ICD-10-CM | POA: Diagnosis not present

## 2015-04-10 DIAGNOSIS — M531 Cervicobrachial syndrome: Secondary | ICD-10-CM | POA: Diagnosis not present

## 2015-04-10 DIAGNOSIS — M9901 Segmental and somatic dysfunction of cervical region: Secondary | ICD-10-CM | POA: Diagnosis not present

## 2015-04-10 DIAGNOSIS — M9902 Segmental and somatic dysfunction of thoracic region: Secondary | ICD-10-CM | POA: Diagnosis not present

## 2015-04-10 DIAGNOSIS — M9903 Segmental and somatic dysfunction of lumbar region: Secondary | ICD-10-CM | POA: Diagnosis not present

## 2015-04-10 DIAGNOSIS — M9905 Segmental and somatic dysfunction of pelvic region: Secondary | ICD-10-CM | POA: Diagnosis not present

## 2015-04-14 DIAGNOSIS — M9905 Segmental and somatic dysfunction of pelvic region: Secondary | ICD-10-CM | POA: Diagnosis not present

## 2015-04-14 DIAGNOSIS — M9903 Segmental and somatic dysfunction of lumbar region: Secondary | ICD-10-CM | POA: Diagnosis not present

## 2015-04-14 DIAGNOSIS — M9902 Segmental and somatic dysfunction of thoracic region: Secondary | ICD-10-CM | POA: Diagnosis not present

## 2015-04-14 DIAGNOSIS — M9901 Segmental and somatic dysfunction of cervical region: Secondary | ICD-10-CM | POA: Diagnosis not present

## 2015-04-14 DIAGNOSIS — M9904 Segmental and somatic dysfunction of sacral region: Secondary | ICD-10-CM | POA: Diagnosis not present

## 2015-04-14 DIAGNOSIS — M531 Cervicobrachial syndrome: Secondary | ICD-10-CM | POA: Diagnosis not present

## 2015-04-16 DIAGNOSIS — M9902 Segmental and somatic dysfunction of thoracic region: Secondary | ICD-10-CM | POA: Diagnosis not present

## 2015-04-16 DIAGNOSIS — M9904 Segmental and somatic dysfunction of sacral region: Secondary | ICD-10-CM | POA: Diagnosis not present

## 2015-04-16 DIAGNOSIS — M9903 Segmental and somatic dysfunction of lumbar region: Secondary | ICD-10-CM | POA: Diagnosis not present

## 2015-04-16 DIAGNOSIS — M9905 Segmental and somatic dysfunction of pelvic region: Secondary | ICD-10-CM | POA: Diagnosis not present

## 2015-04-16 DIAGNOSIS — M9901 Segmental and somatic dysfunction of cervical region: Secondary | ICD-10-CM | POA: Diagnosis not present

## 2015-04-16 DIAGNOSIS — M531 Cervicobrachial syndrome: Secondary | ICD-10-CM | POA: Diagnosis not present

## 2015-04-17 DIAGNOSIS — M9902 Segmental and somatic dysfunction of thoracic region: Secondary | ICD-10-CM | POA: Diagnosis not present

## 2015-04-17 DIAGNOSIS — M531 Cervicobrachial syndrome: Secondary | ICD-10-CM | POA: Diagnosis not present

## 2015-04-17 DIAGNOSIS — M9901 Segmental and somatic dysfunction of cervical region: Secondary | ICD-10-CM | POA: Diagnosis not present

## 2015-04-17 DIAGNOSIS — M9904 Segmental and somatic dysfunction of sacral region: Secondary | ICD-10-CM | POA: Diagnosis not present

## 2015-04-17 DIAGNOSIS — M9903 Segmental and somatic dysfunction of lumbar region: Secondary | ICD-10-CM | POA: Diagnosis not present

## 2015-04-17 DIAGNOSIS — M9905 Segmental and somatic dysfunction of pelvic region: Secondary | ICD-10-CM | POA: Diagnosis not present

## 2015-04-21 DIAGNOSIS — M9904 Segmental and somatic dysfunction of sacral region: Secondary | ICD-10-CM | POA: Diagnosis not present

## 2015-04-21 DIAGNOSIS — M9901 Segmental and somatic dysfunction of cervical region: Secondary | ICD-10-CM | POA: Diagnosis not present

## 2015-04-21 DIAGNOSIS — M9902 Segmental and somatic dysfunction of thoracic region: Secondary | ICD-10-CM | POA: Diagnosis not present

## 2015-04-21 DIAGNOSIS — M9903 Segmental and somatic dysfunction of lumbar region: Secondary | ICD-10-CM | POA: Diagnosis not present

## 2015-04-21 DIAGNOSIS — R635 Abnormal weight gain: Secondary | ICD-10-CM | POA: Diagnosis not present

## 2015-04-21 DIAGNOSIS — M9905 Segmental and somatic dysfunction of pelvic region: Secondary | ICD-10-CM | POA: Diagnosis not present

## 2015-04-21 DIAGNOSIS — M531 Cervicobrachial syndrome: Secondary | ICD-10-CM | POA: Diagnosis not present

## 2015-04-23 DIAGNOSIS — M9901 Segmental and somatic dysfunction of cervical region: Secondary | ICD-10-CM | POA: Diagnosis not present

## 2015-04-23 DIAGNOSIS — M9904 Segmental and somatic dysfunction of sacral region: Secondary | ICD-10-CM | POA: Diagnosis not present

## 2015-04-23 DIAGNOSIS — M9902 Segmental and somatic dysfunction of thoracic region: Secondary | ICD-10-CM | POA: Diagnosis not present

## 2015-04-23 DIAGNOSIS — M531 Cervicobrachial syndrome: Secondary | ICD-10-CM | POA: Diagnosis not present

## 2015-04-23 DIAGNOSIS — M9903 Segmental and somatic dysfunction of lumbar region: Secondary | ICD-10-CM | POA: Diagnosis not present

## 2015-04-23 DIAGNOSIS — M9905 Segmental and somatic dysfunction of pelvic region: Secondary | ICD-10-CM | POA: Diagnosis not present

## 2015-04-24 DIAGNOSIS — M531 Cervicobrachial syndrome: Secondary | ICD-10-CM | POA: Diagnosis not present

## 2015-04-24 DIAGNOSIS — M9901 Segmental and somatic dysfunction of cervical region: Secondary | ICD-10-CM | POA: Diagnosis not present

## 2015-04-24 DIAGNOSIS — M9902 Segmental and somatic dysfunction of thoracic region: Secondary | ICD-10-CM | POA: Diagnosis not present

## 2015-04-24 DIAGNOSIS — M9905 Segmental and somatic dysfunction of pelvic region: Secondary | ICD-10-CM | POA: Diagnosis not present

## 2015-04-24 DIAGNOSIS — M9903 Segmental and somatic dysfunction of lumbar region: Secondary | ICD-10-CM | POA: Diagnosis not present

## 2015-04-24 DIAGNOSIS — M9904 Segmental and somatic dysfunction of sacral region: Secondary | ICD-10-CM | POA: Diagnosis not present

## 2015-04-28 DIAGNOSIS — M9903 Segmental and somatic dysfunction of lumbar region: Secondary | ICD-10-CM | POA: Diagnosis not present

## 2015-04-28 DIAGNOSIS — M9905 Segmental and somatic dysfunction of pelvic region: Secondary | ICD-10-CM | POA: Diagnosis not present

## 2015-04-28 DIAGNOSIS — M9901 Segmental and somatic dysfunction of cervical region: Secondary | ICD-10-CM | POA: Diagnosis not present

## 2015-04-28 DIAGNOSIS — M9902 Segmental and somatic dysfunction of thoracic region: Secondary | ICD-10-CM | POA: Diagnosis not present

## 2015-04-28 DIAGNOSIS — M531 Cervicobrachial syndrome: Secondary | ICD-10-CM | POA: Diagnosis not present

## 2015-04-28 DIAGNOSIS — M9904 Segmental and somatic dysfunction of sacral region: Secondary | ICD-10-CM | POA: Diagnosis not present

## 2015-04-30 DIAGNOSIS — M9903 Segmental and somatic dysfunction of lumbar region: Secondary | ICD-10-CM | POA: Diagnosis not present

## 2015-04-30 DIAGNOSIS — M9901 Segmental and somatic dysfunction of cervical region: Secondary | ICD-10-CM | POA: Diagnosis not present

## 2015-04-30 DIAGNOSIS — M531 Cervicobrachial syndrome: Secondary | ICD-10-CM | POA: Diagnosis not present

## 2015-04-30 DIAGNOSIS — M9905 Segmental and somatic dysfunction of pelvic region: Secondary | ICD-10-CM | POA: Diagnosis not present

## 2015-04-30 DIAGNOSIS — M9902 Segmental and somatic dysfunction of thoracic region: Secondary | ICD-10-CM | POA: Diagnosis not present

## 2015-04-30 DIAGNOSIS — M9904 Segmental and somatic dysfunction of sacral region: Secondary | ICD-10-CM | POA: Diagnosis not present

## 2015-05-01 ENCOUNTER — Other Ambulatory Visit (HOSPITAL_COMMUNITY): Payer: Self-pay | Admitting: Psychiatry

## 2015-05-01 DIAGNOSIS — M9904 Segmental and somatic dysfunction of sacral region: Secondary | ICD-10-CM | POA: Diagnosis not present

## 2015-05-01 DIAGNOSIS — M9905 Segmental and somatic dysfunction of pelvic region: Secondary | ICD-10-CM | POA: Diagnosis not present

## 2015-05-01 DIAGNOSIS — M9902 Segmental and somatic dysfunction of thoracic region: Secondary | ICD-10-CM | POA: Diagnosis not present

## 2015-05-01 DIAGNOSIS — M9901 Segmental and somatic dysfunction of cervical region: Secondary | ICD-10-CM | POA: Diagnosis not present

## 2015-05-01 DIAGNOSIS — M9903 Segmental and somatic dysfunction of lumbar region: Secondary | ICD-10-CM | POA: Diagnosis not present

## 2015-05-01 DIAGNOSIS — M531 Cervicobrachial syndrome: Secondary | ICD-10-CM | POA: Diagnosis not present

## 2015-05-05 ENCOUNTER — Encounter (HOSPITAL_COMMUNITY): Payer: Self-pay | Admitting: Psychiatry

## 2015-05-05 ENCOUNTER — Ambulatory Visit (INDEPENDENT_AMBULATORY_CARE_PROVIDER_SITE_OTHER): Payer: Medicare Other | Admitting: Psychiatry

## 2015-05-05 VITALS — BP 104/66 | HR 62 | Ht 63.0 in | Wt 126.4 lb

## 2015-05-05 DIAGNOSIS — F988 Other specified behavioral and emotional disorders with onset usually occurring in childhood and adolescence: Secondary | ICD-10-CM

## 2015-05-05 DIAGNOSIS — F251 Schizoaffective disorder, depressive type: Secondary | ICD-10-CM | POA: Diagnosis not present

## 2015-05-05 DIAGNOSIS — F411 Generalized anxiety disorder: Secondary | ICD-10-CM | POA: Diagnosis not present

## 2015-05-05 DIAGNOSIS — F902 Attention-deficit hyperactivity disorder, combined type: Secondary | ICD-10-CM

## 2015-05-05 DIAGNOSIS — M9905 Segmental and somatic dysfunction of pelvic region: Secondary | ICD-10-CM | POA: Diagnosis not present

## 2015-05-05 DIAGNOSIS — M531 Cervicobrachial syndrome: Secondary | ICD-10-CM | POA: Diagnosis not present

## 2015-05-05 DIAGNOSIS — M9902 Segmental and somatic dysfunction of thoracic region: Secondary | ICD-10-CM | POA: Diagnosis not present

## 2015-05-05 DIAGNOSIS — M9904 Segmental and somatic dysfunction of sacral region: Secondary | ICD-10-CM | POA: Diagnosis not present

## 2015-05-05 DIAGNOSIS — M9901 Segmental and somatic dysfunction of cervical region: Secondary | ICD-10-CM | POA: Diagnosis not present

## 2015-05-05 DIAGNOSIS — M9903 Segmental and somatic dysfunction of lumbar region: Secondary | ICD-10-CM | POA: Diagnosis not present

## 2015-05-05 MED ORDER — HALOPERIDOL 2 MG PO TABS: ORAL_TABLET | ORAL | Status: DC

## 2015-05-05 MED ORDER — AMPHETAMINE-DEXTROAMPHETAMINE 10 MG PO TABS: 10.0000 mg | ORAL_TABLET | Freq: Every day | ORAL | Status: DC

## 2015-05-05 MED ORDER — ALPRAZOLAM 0.5 MG PO TABS
0.5000 mg | ORAL_TABLET | Freq: Every day | ORAL | Status: DC
Start: 2015-05-05 — End: 2015-08-22

## 2015-05-05 MED ORDER — LAMOTRIGINE 150 MG PO TABS: 150.0000 mg | ORAL_TABLET | Freq: Every day | ORAL | Status: DC

## 2015-05-05 MED ORDER — BENZTROPINE MESYLATE 0.5 MG PO TABS
ORAL_TABLET | ORAL | Status: DC
Start: 2015-05-05 — End: 2015-08-06

## 2015-05-05 MED ORDER — HYDROXYZINE PAMOATE 25 MG PO CAPS
25.0000 mg | ORAL_CAPSULE | Freq: Every day | ORAL | Status: DC
Start: 2015-05-05 — End: 2015-07-06

## 2015-05-05 MED ORDER — ESCITALOPRAM OXALATE 10 MG PO TABS: ORAL_TABLET | ORAL | Status: DC

## 2015-05-05 MED ORDER — ALPRAZOLAM 0.5 MG PO TABS: 0.5000 mg | ORAL_TABLET | Freq: Every day | ORAL | Status: DC

## 2015-05-05 NOTE — Progress Notes (Signed)
[facility] [phone] Progress Note  TAMIKAH LANEVE [id number] 45 y.o.  Chief Complaint:  I am doing better but I cannot sleep.      History of Present Illness:  Ruth Duncan came for her follow-up appointment.  She is taking her medication as prescribed.  She like Adderall which is helping her focus and attention.  She is also happy that she did not gain weight from the last visit.  However she endorse insomnia and nervousness.  She is happy as going to [place] with her friends and then [place].  Patient denies any tremors, shakes, EPS.  She continues to have sometimes very tense relationship with her husband who cheated on her few months ago.  But she believed things are going much better now.  Her attention and focus is good.  Her energy level is good.  She denies any hallucination, suicidal thoughts or homicidal thought.  Patient denies drinking alcohol or using any illegal substances.  Patient will see Dr. [name] on the 15th for counseling.  Suicidal Ideation: No Plan Formed: No Patient has means to carry out plan: No  Homicidal Ideation: No Plan Formed: No Patient has means to carry out plan: No  Review of Systems  Cardiovascular: Negative for chest pain and palpitations.  Musculoskeletal: Negative.   Neurological: Negative for tremors and headaches.  Psychiatric/Behavioral: Negative for suicidal ideas and substance abuse. The patient is nervous/anxious and has insomnia.    Psychiatric: Agitation: No Hallucination: No Depressed Mood: No Insomnia: No Hypersomnia: No Altered Concentration: No Feels Worthless: No Grandiose Ideas: No Belief In Special Powers: No New/Increased Substance Abuse: No Compulsions: No  Neurologic: Headache: No Seizure: No Paresthesias: No  Past Medical History:  Her primary care physician is at [facility]. She has history of diverticulitis, colon cancer an ovarian cancer.   Outpatient Encounter Prescriptions as of  05/05/2015  Medication Sig  . ALPRAZolam (XANAX) 0.5 MG tablet Take 1 tablet (0.5 mg total) by mouth at bedtime.  [name] amphetamine-dextroamphetamine (ADDERALL) 10 MG tablet Take 1 tablet (10 mg total) by mouth daily.  . benztropine (COGENTIN) 0.5 MG tablet TAKE 1 TABLET (0.5 MG TOTAL) BY MOUTH AT BEDTIME.  [name] escitalopram (LEXAPRO) 10 MG tablet TAKE 1 TABLET (10 MG TOTAL) BY MOUTH DAILY.  [name] ESTRACE VAGINAL 0.1 MG/GM vaginal cream Place 1 Applicatorful vaginally at bedtime.   . haloperidol (HALDOL) 2 MG tablet Take 1 tab in am and 3 at bed time  . hydrOXYzine (VISTARIL) 25 MG capsule Take 1 capsule (25 mg total) by mouth at bedtime.  . lamoTRIgine (LAMICTAL) 150 MG tablet Take 1 tablet (150 mg total) by mouth daily.  . methotrexate (RHEUMATREX) 2.5 MG tablet Take 12.5 mg by mouth once a week. thursday  . nitrofurantoin (MACRODANTIN) 50 MG capsule Take 50 mg by mouth at bedtime.   . pantoprazole (PROTONIX) 40 MG tablet 1 PO 30 MINS BEFORE BREAKFAST AND SUPPER  . valACYclovir (VALTREX) 500 MG tablet Take 500 mg by mouth once a week.   . [DISCONTINUED] ALPRAZolam (XANAX) 0.5 MG tablet Take 1 tablet (0.5 mg total) by mouth at bedtime.  . [DISCONTINUED] ALPRAZolam (XANAX) 0.5 MG tablet Take 1 tablet (0.5 mg total) by mouth at bedtime.  . [DISCONTINUED] amphetamine-dextroamphetamine (ADDERALL) 10 MG tablet Take 1 tablet (10 mg total) by mouth daily.  . [DISCONTINUED] amphetamine-dextroamphetamine (ADDERALL) 10 MG tablet Take 1 tablet (10 mg total) by mouth daily.  . [DISCONTINUED] amphetamine-dextroamphetamine (ADDERALL) 10 MG tablet Take 1 tablet (10 mg  total) by mouth daily.  . [DISCONTINUED] benztropine (COGENTIN) 0.5 MG tablet TAKE 1 TABLET (0.5 MG TOTAL) BY MOUTH AT BEDTIME.  . [DISCONTINUED] escitalopram (LEXAPRO) 10 MG tablet TAKE 1 TABLET (10 MG TOTAL) BY MOUTH DAILY.  . [DISCONTINUED] haloperidol (HALDOL) 2 MG tablet Take 1 tab in am and 3 at bed time  . [DISCONTINUED] lamoTRIgine (LAMICTAL) 150 MG  tablet Take 1 tablet (150 mg total) by mouth daily.   No facility-administered encounter medications on file as of 05/05/2015.   No results found for this or any previous visit (from the past 2160 hour(s)).  Past Psychiatric History/Hospitalization(s): Patient has history of inpatient treatment after taking overdose on her medication.  She has history of using drugs and diagnosed with schizoaffective disorder and ADD .  In the past she had tried Abilify, lithium, Vyvanse, Fanpat and Seroquel.  She used to see Dr. Toy Care, [facility].  She is seeing outpatient services in this office since April 2013.  Patient has [facility] IOP program in the past.   Anxiety: Yes Bipolar Disorder: No Depression: Yes Mania: No Psychosis: Yes Schizophrenia: Yes Personality Disorder: No Hospitalization for psychiatric illness: Yes History of Electroconvulsive Shock Therapy: No Prior Suicide Attempts: No  Physical Exam: Constitutional:  BP 104/66 mmHg  Pulse 62  Ht 5\' 3"  (1.6 m)  Wt 126 lb 6.4 oz (57.335 kg)  BMI 22.40 kg/m2  General Appearance: well nourished  Musculoskeletal: Strength & Muscle Tone: within normal limits Gait & Station: normal Patient leans: N/A  Mental status examination . Patient is casually dressed and fairly groomed.  She is anxious but cooperative.  She maintained good eye contact.  Her speech is fast, pressured at times.  She described her mood anxious nervous and her affect is labile.  She Denies any auditory or visual hallucination.  She denies any active or any passive suicidal or homicidal thoughts. Her attention and concentration is fair.  Her psychomotor activity is somewhat restless.  Her fund of knowledge is adequate.  She has no tremors, shakes.  Her cognition is good.  She's alert and oriented 3.  Her insight judgment and impulse control is okay.  Established Problem, Stable/Improving (1), Review of Psycho-Social Stressors (1), Review of Last Therapy Session (1), Review of  Medication Regimen & Side Effects (2) and Review of New Medication or Change in Dosage (2)  Assessment: Axis I: Schizoaffective disorder, generalized anxiety disorder, ADHD combined type  Axis II: Deferred  Axis III: Diverticulitis  Colon cancer  Ovarian cancer  Surgical menopause    Plan:  Patient is doing much better on her medication.  However she still have occasional insomnia.  I recommended to try Vistaril 25 mg at bedtime when necessary for insomnia.  Continue Lamictal 150 mg daily, Haldol 2 mg in the morning and 6 mg at bedtime, Adderall 10 mg daily, Lexapro 10 mg daily and Xanax 0.5 mg at bedtime as needed for anxiety.  Patient has appointment to see Dr. [name] on 15th.  Encouraged to keep the appointment for counseling. Discuss safety plan that anytime having active suicidal thoughts or homicidal thought that she need to call 911 or go to the local emergency room. Follow-up in 3 months.    Terence Bart T., MD 05/05/2015

## 2015-05-07 DIAGNOSIS — M531 Cervicobrachial syndrome: Secondary | ICD-10-CM | POA: Diagnosis not present

## 2015-05-07 DIAGNOSIS — M9901 Segmental and somatic dysfunction of cervical region: Secondary | ICD-10-CM | POA: Diagnosis not present

## 2015-05-07 DIAGNOSIS — M9902 Segmental and somatic dysfunction of thoracic region: Secondary | ICD-10-CM | POA: Diagnosis not present

## 2015-05-07 DIAGNOSIS — M9905 Segmental and somatic dysfunction of pelvic region: Secondary | ICD-10-CM | POA: Diagnosis not present

## 2015-05-07 DIAGNOSIS — M9903 Segmental and somatic dysfunction of lumbar region: Secondary | ICD-10-CM | POA: Diagnosis not present

## 2015-05-07 DIAGNOSIS — M9904 Segmental and somatic dysfunction of sacral region: Secondary | ICD-10-CM | POA: Diagnosis not present

## 2015-05-12 DIAGNOSIS — M9902 Segmental and somatic dysfunction of thoracic region: Secondary | ICD-10-CM | POA: Diagnosis not present

## 2015-05-12 DIAGNOSIS — M9901 Segmental and somatic dysfunction of cervical region: Secondary | ICD-10-CM | POA: Diagnosis not present

## 2015-05-12 DIAGNOSIS — M9904 Segmental and somatic dysfunction of sacral region: Secondary | ICD-10-CM | POA: Diagnosis not present

## 2015-05-12 DIAGNOSIS — M531 Cervicobrachial syndrome: Secondary | ICD-10-CM | POA: Diagnosis not present

## 2015-05-12 DIAGNOSIS — M9905 Segmental and somatic dysfunction of pelvic region: Secondary | ICD-10-CM | POA: Diagnosis not present

## 2015-05-12 DIAGNOSIS — M9903 Segmental and somatic dysfunction of lumbar region: Secondary | ICD-10-CM | POA: Diagnosis not present

## 2015-05-14 DIAGNOSIS — M9901 Segmental and somatic dysfunction of cervical region: Secondary | ICD-10-CM | POA: Diagnosis not present

## 2015-05-14 DIAGNOSIS — M531 Cervicobrachial syndrome: Secondary | ICD-10-CM | POA: Diagnosis not present

## 2015-05-14 DIAGNOSIS — M9905 Segmental and somatic dysfunction of pelvic region: Secondary | ICD-10-CM | POA: Diagnosis not present

## 2015-05-14 DIAGNOSIS — M9904 Segmental and somatic dysfunction of sacral region: Secondary | ICD-10-CM | POA: Diagnosis not present

## 2015-05-14 DIAGNOSIS — M9902 Segmental and somatic dysfunction of thoracic region: Secondary | ICD-10-CM | POA: Diagnosis not present

## 2015-05-14 DIAGNOSIS — M9903 Segmental and somatic dysfunction of lumbar region: Secondary | ICD-10-CM | POA: Diagnosis not present

## 2015-05-19 ENCOUNTER — Ambulatory Visit (INDEPENDENT_AMBULATORY_CARE_PROVIDER_SITE_OTHER): Payer: Medicare Other | Admitting: Psychology

## 2015-05-19 ENCOUNTER — Telehealth: Payer: Self-pay | Admitting: Gastroenterology

## 2015-05-19 DIAGNOSIS — M9901 Segmental and somatic dysfunction of cervical region: Secondary | ICD-10-CM | POA: Diagnosis not present

## 2015-05-19 DIAGNOSIS — M531 Cervicobrachial syndrome: Secondary | ICD-10-CM | POA: Diagnosis not present

## 2015-05-19 DIAGNOSIS — M9904 Segmental and somatic dysfunction of sacral region: Secondary | ICD-10-CM | POA: Diagnosis not present

## 2015-05-19 DIAGNOSIS — F251 Schizoaffective disorder, depressive type: Secondary | ICD-10-CM

## 2015-05-19 DIAGNOSIS — M9903 Segmental and somatic dysfunction of lumbar region: Secondary | ICD-10-CM | POA: Diagnosis not present

## 2015-05-19 DIAGNOSIS — M9902 Segmental and somatic dysfunction of thoracic region: Secondary | ICD-10-CM | POA: Diagnosis not present

## 2015-05-19 DIAGNOSIS — M9905 Segmental and somatic dysfunction of pelvic region: Secondary | ICD-10-CM | POA: Diagnosis not present

## 2015-05-19 DIAGNOSIS — F411 Generalized anxiety disorder: Secondary | ICD-10-CM

## 2015-05-19 NOTE — Telephone Encounter (Signed)
Pt is aware of her OV with [facility] on 6/14 at 3pm. She is going to [place] and wanted to know if [facility] would write her a prescription to hold her until her OV. I told her to leave a VM with [facility] nurse of what she was needing and that she would be out of the country until June 2. She agreed and call transferred.

## 2015-05-19 NOTE — Telephone Encounter (Signed)
LMOM to call.

## 2015-05-19 NOTE — Telephone Encounter (Signed)
Pt is scheduled an OV on 05/20/2015 at 2:00 PM with Laban Emperor, NP. She is having some abdominal pain and will be going out of the country May 26-June 2.

## 2015-05-20 ENCOUNTER — Encounter: Payer: Self-pay | Admitting: Gastroenterology

## 2015-05-20 ENCOUNTER — Other Ambulatory Visit (HOSPITAL_COMMUNITY)
Admission: RE | Admit: 2015-05-20 | Discharge: 2015-05-20 | Disposition: A | Discharge status: Home / Self Care | Payer: Medicare Other | Source: Ambulatory Visit | Attending: Gastroenterology | Admitting: Gastroenterology

## 2015-05-20 ENCOUNTER — Ambulatory Visit (INDEPENDENT_AMBULATORY_CARE_PROVIDER_SITE_OTHER): Payer: Medicare Other | Admitting: Gastroenterology

## 2015-05-20 VITALS — BP 111/70 | HR 74 | Temp 97.3°F | Ht 62.0 in | Wt 125.0 lb

## 2015-05-20 DIAGNOSIS — R1010 Upper abdominal pain, unspecified: Secondary | ICD-10-CM | POA: Diagnosis not present

## 2015-05-20 DIAGNOSIS — R109 Unspecified abdominal pain: Secondary | ICD-10-CM | POA: Insufficient documentation

## 2015-05-20 LAB — COMPREHENSIVE METABOLIC PANEL
ALT: 16 U/L (ref 14–54)
AST: 17 U/L (ref 15–41)
Albumin: 4.5 g/dL (ref 3.5–5.0)
Alkaline Phosphatase: 52 U/L (ref 38–126)
Anion gap: 7 (ref 5–15)
BUN: 12 mg/dL (ref 6–20)
CO2: 26 mmol/L (ref 22–32)
Calcium: 8.8 mg/dL — ABNORMAL LOW (ref 8.9–10.3)
Chloride: 100 mmol/L — ABNORMAL LOW (ref 101–111)
Creatinine, Ser: 0.62 mg/dL (ref 0.44–1.00)
GFR calc Af Amer: >60 mL/min (ref >60)
GFR calc non Af Amer: >60 mL/min (ref >60)
Glucose, Bld: 92 mg/dL (ref 65–99)
Potassium: 4 mmol/L (ref 3.5–5.1)
Sodium: 133 mmol/L — ABNORMAL LOW (ref 135–145)
Total Bilirubin: 0.7 mg/dL (ref 0.3–1.2)
Total Protein: 7.6 g/dL (ref 6.5–8.1)

## 2015-05-20 LAB — LIPASE, BLOOD: Lipase: 26 U/L (ref 11–51)

## 2015-05-20 MED ORDER — ONDANSETRON HCL 4 MG PO TABS: 4.0000 mg | ORAL_TABLET | Freq: Three times a day (TID) | ORAL | Status: DC | PRN

## 2015-05-20 MED ORDER — PROMETHAZINE HCL 25 MG PO TABS
25.0000 mg | ORAL_TABLET | Freq: Four times a day (QID) | ORAL | Status: DC | PRN
Start: 2015-05-20 — End: 2018-01-11

## 2015-05-20 NOTE — Assessment & Plan Note (Signed)
In setting of known GERD, gastritis, EGD fairly recently completed. Symptoms concerning for biliary etiology. No recent US abdomen or labs on file. Stat CMP, lipase ordered now. Zofran, Phenergan provided for prn usage. Checked [facility] database for narcotics and no narcotics have been filled recently. Patient requesting narcotics. At this time, no clinical indication to provide, and we will expedite evaluation with ultrasound of abdomen and possibly HIDA if necessary; she will be leaving the country on the 26th for a short trip. Requested pain medication "just in case" while out of the country. I have declined this but will ultimately review with Dr. Oneida Alar; if there is a clinical reason for narcotics, will provide this after review of labs and ultrasound. However, at this point, she is clinically stable. Supportive measures and PPI at this time.
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
## 2015-05-20 NOTE — Patient Instructions (Signed)
Please have blood work done today at the hospital.  I have ordered an ultrasound of your gallbladder.   I'm going to hold off on any pain medication until we review the labs and I have a chance to talk with Dr. Oneida Alar. If you have any severe pain, call us immediately.

## 2015-05-20 NOTE — Progress Notes (Signed)
Quick Note:  LFTs and lipase normal. Await US abdomen. ______

## 2015-05-21 DIAGNOSIS — M9905 Segmental and somatic dysfunction of pelvic region: Secondary | ICD-10-CM | POA: Diagnosis not present

## 2015-05-21 DIAGNOSIS — M531 Cervicobrachial syndrome: Secondary | ICD-10-CM | POA: Diagnosis not present

## 2015-05-21 DIAGNOSIS — M9904 Segmental and somatic dysfunction of sacral region: Secondary | ICD-10-CM | POA: Diagnosis not present

## 2015-05-21 DIAGNOSIS — M9902 Segmental and somatic dysfunction of thoracic region: Secondary | ICD-10-CM | POA: Diagnosis not present

## 2015-05-21 DIAGNOSIS — M9903 Segmental and somatic dysfunction of lumbar region: Secondary | ICD-10-CM | POA: Diagnosis not present

## 2015-05-21 DIAGNOSIS — M9901 Segmental and somatic dysfunction of cervical region: Secondary | ICD-10-CM | POA: Diagnosis not present

## 2015-05-21 NOTE — Progress Notes (Signed)
Quick Note:  Pt is aware of results. She said she did not get the Rx for the Tramadol. I told her Vicente Males does not do much of that. Then she said, "well, I think she was going to discuss with Dr. Oneida Alar " and I told her yes. ______

## 2015-05-21 NOTE — Progress Notes (Signed)
Quick Note:  Correct. I had originally discussed possibly giving this to her at the appointment, and then I saw it potentially interacted with some of her medications. I felt that it would be best to avoid this until further discussion with Dr. Oneida Alar. I am sure it was confusing how we left it. But you are correct, holding off on Tramadol or any other narcotic until further discussion. ______

## 2015-05-21 NOTE — Progress Notes (Signed)
Quick Note:  LMOM to call. ______ 

## 2015-05-21 NOTE — Progress Notes (Signed)
cc'ed to pcp °

## 2015-05-26 ENCOUNTER — Other Ambulatory Visit: Payer: Self-pay | Admitting: Gastroenterology

## 2015-05-26 ENCOUNTER — Ambulatory Visit (HOSPITAL_COMMUNITY)
Admission: RE | Admit: 2015-05-26 | Discharge: 2015-05-26 | Disposition: A | Discharge status: Home / Self Care | Payer: Medicare Other | Source: Ambulatory Visit | Attending: Gastroenterology | Admitting: Gastroenterology

## 2015-05-26 ENCOUNTER — Other Ambulatory Visit: Payer: Self-pay

## 2015-05-26 DIAGNOSIS — R1010 Upper abdominal pain, unspecified: Secondary | ICD-10-CM

## 2015-05-26 DIAGNOSIS — R1011 Right upper quadrant pain: Secondary | ICD-10-CM | POA: Diagnosis not present

## 2015-05-26 DIAGNOSIS — N281 Cyst of kidney, acquired: Secondary | ICD-10-CM | POA: Insufficient documentation

## 2015-05-26 MED ORDER — IOPAMIDOL (ISOVUE-300) INJECTION 61%
100.0000 mL | Freq: Once | INTRAVENOUS | Status: AC | PRN
  Administered 2015-05-26: 100 mL via INTRAVENOUS

## 2015-05-26 NOTE — Progress Notes (Signed)
Quick Note:  US abdomen reviewed. I had been concerned about biliary etiology; however, ultrasound shows a normal gallbladder without stones. A complex cystic structure is noted in the RIGHT KIDNEY, which raises concern for this being the etiology of pain. She notes right-sided abdominal pain. We need a CT scan with renal protocol ASAP. She is going out of town, and it would be good to check this out as soon as possible. No known allergies listed. Recent Creatinine is on file. ______

## 2015-05-27 ENCOUNTER — Telehealth: Payer: Self-pay | Admitting: Gastroenterology

## 2015-05-27 DIAGNOSIS — M9902 Segmental and somatic dysfunction of thoracic region: Secondary | ICD-10-CM | POA: Diagnosis not present

## 2015-05-27 DIAGNOSIS — M9903 Segmental and somatic dysfunction of lumbar region: Secondary | ICD-10-CM | POA: Diagnosis not present

## 2015-05-27 DIAGNOSIS — M9901 Segmental and somatic dysfunction of cervical region: Secondary | ICD-10-CM | POA: Diagnosis not present

## 2015-05-27 DIAGNOSIS — M9905 Segmental and somatic dysfunction of pelvic region: Secondary | ICD-10-CM | POA: Diagnosis not present

## 2015-05-27 DIAGNOSIS — M531 Cervicobrachial syndrome: Secondary | ICD-10-CM | POA: Diagnosis not present

## 2015-05-27 DIAGNOSIS — M9904 Segmental and somatic dysfunction of sacral region: Secondary | ICD-10-CM | POA: Diagnosis not present

## 2015-05-27 NOTE — Progress Notes (Signed)
Quick Note:  CT reviewed. Small bilateral renal cysts, and a single 7 mm cyst in the medial right lower kidney is technically too small to characterize but not significantly changed from 2013. It is felt to be benign. This is good. I do not have an explanation for her abdominal pain just yet, and the final step would be a HIDA scan to assess biliary function. However, if she is tolerating her diet, no N/V, then I would say this is not urgent to do. ______

## 2015-05-27 NOTE — Telephone Encounter (Signed)
It was supposed to be ASAP, because I knew she was going out of town, so I'm sorry that this worried her! Please see the result note. As for pain medication: there is no indication to give any currently. I spoke with Dr. Oneida Alar, and no narcotics recommended unless there is a reason. Right now, her exams have been normal. The only final exam would be a HIDA scan to assess for biliary dyskinesia, but as I said in the result note, if she is tolerating her diet and no N/V, this would not be urgent.

## 2015-05-27 NOTE — Telephone Encounter (Signed)
Pt is just concerned because the CT was scheduled STAT and she wants to know what is going on since she will be going away on Friday.

## 2015-05-27 NOTE — Telephone Encounter (Signed)
Pt is aware. She will wait til after her trip.

## 2015-05-27 NOTE — Telephone Encounter (Signed)
Patient called this morning asking about her U/S she had yesterday and then a CT was ordered. She is leaving for [place] and needs to speak with someone. Please call [phone]

## 2015-05-27 NOTE — Progress Notes (Signed)
Quick Note:  Pt is aware and will just wait until she returns from her trip. Said she has an appt to see Dr. Oneida Alar when she returns and she can discuss/schedule at that time. ______

## 2015-05-28 DIAGNOSIS — M9903 Segmental and somatic dysfunction of lumbar region: Secondary | ICD-10-CM | POA: Diagnosis not present

## 2015-05-28 DIAGNOSIS — M531 Cervicobrachial syndrome: Secondary | ICD-10-CM | POA: Diagnosis not present

## 2015-05-28 DIAGNOSIS — M9905 Segmental and somatic dysfunction of pelvic region: Secondary | ICD-10-CM | POA: Diagnosis not present

## 2015-05-28 DIAGNOSIS — M9901 Segmental and somatic dysfunction of cervical region: Secondary | ICD-10-CM | POA: Diagnosis not present

## 2015-05-28 DIAGNOSIS — M9902 Segmental and somatic dysfunction of thoracic region: Secondary | ICD-10-CM | POA: Diagnosis not present

## 2015-05-28 DIAGNOSIS — M9904 Segmental and somatic dysfunction of sacral region: Secondary | ICD-10-CM | POA: Diagnosis not present

## 2015-06-04 ENCOUNTER — Encounter (HOSPITAL_COMMUNITY): Payer: Self-pay | Admitting: Psychology

## 2015-06-04 NOTE — Progress Notes (Signed)
Patient:  Ruth Duncan   DOB: 1970/05/22  MR Number: [id number]  Location: [facility] [address] [place] [address] Dept: [phone]  Start: 1 PM End: 2 PM  Provider/Observer:     Edgardo Roys PSYD  Chief Complaint:      Chief Complaint  Patient presents with  . Anxiety  . Agitation  . Paranoid  . Stress    Reason For Service:     The patient is a 45 year old female who has been followed by the outpatient facility in [place] since April 2013. She has been diagnosed with schizoaffective disorder as well as previous diagnoses of attention deficit disorder. I reviewed the patient's history and it is consistent with a mood disorder along with hallucinations primarily auditory. The patient describes episodes of significant manic events and agitation as well as severe depressive events. She reports that she has developed suicidal ideation during significant depressive episodes. 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 The patient also has a history of alcohol abuse as well but she has been free from alcohol abuse for quite some time. The patient also quit smoking. The patient reports that she has had these symptoms since she was about 12 or 13 years as far she can remember. She has been treated in the past by other psychiatrists. She has had poor response to some medications and good response to other medications. However, she is trying to cope with guilty feelings about one of her children having autism thinking that it is her fault and feeling bad about her condition and how it affects her husband in the way his family use her.   Interventions Strategy:  Cognitive/behavioral psychotherapeutic interventions  Participation Level:   Active  Participation  Quality:  Appropriate      Behavioral Observation:  Well Groomed, Alert, and Appropriate and Tearful.   Current Psychosocial Factors: The patient reports that she is still struggling with what to do about her husband and whether to stay married or leave him.  Fear about loss of family structure is there but she feels like she will never be able to trust him.  Content of Session:   Reviewed current symptoms and work on therapeutic interventions about building better coping skills and strategies to deal with her underlying mood disorder. We looked at some foundational issues such as her sleep pattern, dietary pattern, and physical activity patterns.  Current Status:   The patient reports that she has not had any full-blown manic or depressive events recently  But has been dealing with major stress related to infidelity from her husband.  She reports that she is talking with friends and trying to figure out what to do next.  Target Goals:   Target goals include reducing intensity, severity, and duration of her mood swings related to both manic episodes as well as depressive episodes.  Last Reviewed:    05/20/2015  Goals Addressed Today:    Goals addressed today had to do with building better coping skills around managing her moods.  Impression/Diagnosis:   The patient describes a history of mood disorder that included auditory hallucinations dating back to age. She reports that these symptoms have been treated to varying degrees to the years. She reports that she has had difficulty with substance abuse related to alcohol as a means to self  medicate herself and has also gotten herself in trouble during manic episodes. She also has events where she developed suicidal ideation during times of significant depression.  Diagnosis:    Axis I: Schizoaffective disorder, depressive type ([place])  Generalized anxiety disorder   Sima Lindenberger R, PsyD 06/04/2015

## 2015-06-05 NOTE — Telephone Encounter (Signed)
REVIEWED-NO ADDITIONAL RECOMMENDATIONS. 

## 2015-06-05 NOTE — Progress Notes (Addendum)
REVIEWED-NO ADDITIONAL RECOMMENDATIONS. CT ABD/PELVIS W/ IVC-NAIAP

## 2015-06-09 ENCOUNTER — Ambulatory Visit (INDEPENDENT_AMBULATORY_CARE_PROVIDER_SITE_OTHER): Payer: Medicare Other | Admitting: Psychology

## 2015-06-09 DIAGNOSIS — F411 Generalized anxiety disorder: Secondary | ICD-10-CM

## 2015-06-09 DIAGNOSIS — F251 Schizoaffective disorder, depressive type: Secondary | ICD-10-CM | POA: Diagnosis not present

## 2015-06-18 ENCOUNTER — Ambulatory Visit: Payer: Self-pay | Admitting: Gastroenterology

## 2015-07-05 ENCOUNTER — Other Ambulatory Visit (HOSPITAL_COMMUNITY): Payer: Self-pay | Admitting: Psychiatry

## 2015-07-06 ENCOUNTER — Other Ambulatory Visit (HOSPITAL_COMMUNITY): Payer: Self-pay | Admitting: Psychiatry

## 2015-07-06 DIAGNOSIS — F251 Schizoaffective disorder, depressive type: Secondary | ICD-10-CM

## 2015-07-06 MED ORDER — HYDROXYZINE PAMOATE 25 MG PO CAPS: 25.0000 mg | ORAL_CAPSULE | Freq: Every day | ORAL | Status: DC

## 2015-07-16 ENCOUNTER — Ambulatory Visit (HOSPITAL_COMMUNITY): Payer: Self-pay | Admitting: Psychology

## 2015-07-18 ENCOUNTER — Other Ambulatory Visit (HOSPITAL_COMMUNITY): Payer: Self-pay | Admitting: Psychiatry

## 2015-08-02 ENCOUNTER — Other Ambulatory Visit (HOSPITAL_COMMUNITY): Payer: Self-pay | Admitting: Psychiatry

## 2015-08-02 DIAGNOSIS — F251 Schizoaffective disorder, depressive type: Secondary | ICD-10-CM

## 2015-08-04 ENCOUNTER — Ambulatory Visit (HOSPITAL_COMMUNITY): Payer: Self-pay | Admitting: Psychiatry

## 2015-08-06 ENCOUNTER — Other Ambulatory Visit (HOSPITAL_COMMUNITY): Payer: Self-pay | Admitting: Psychiatry

## 2015-08-06 DIAGNOSIS — F251 Schizoaffective disorder, depressive type: Secondary | ICD-10-CM

## 2015-08-06 MED ORDER — LAMOTRIGINE 150 MG PO TABS: 150.0000 mg | ORAL_TABLET | Freq: Every day | ORAL | 0 refills | Status: DC

## 2015-08-06 MED ORDER — BENZTROPINE MESYLATE 0.5 MG PO TABS: ORAL_TABLET | ORAL | 0 refills | Status: DC

## 2015-08-11 ENCOUNTER — Ambulatory Visit (INDEPENDENT_AMBULATORY_CARE_PROVIDER_SITE_OTHER): Payer: Medicare Other | Admitting: Psychology

## 2015-08-11 DIAGNOSIS — F411 Generalized anxiety disorder: Secondary | ICD-10-CM

## 2015-08-11 DIAGNOSIS — F251 Schizoaffective disorder, depressive type: Secondary | ICD-10-CM | POA: Diagnosis not present

## 2015-08-13 ENCOUNTER — Other Ambulatory Visit (HOSPITAL_COMMUNITY): Payer: Self-pay | Admitting: Psychiatry

## 2015-08-13 DIAGNOSIS — F251 Schizoaffective disorder, depressive type: Secondary | ICD-10-CM

## 2015-08-21 ENCOUNTER — Ambulatory Visit (INDEPENDENT_AMBULATORY_CARE_PROVIDER_SITE_OTHER): Payer: Medicare Other | Admitting: Gastroenterology

## 2015-08-21 ENCOUNTER — Encounter: Payer: Self-pay | Admitting: Gastroenterology

## 2015-08-21 ENCOUNTER — Other Ambulatory Visit: Payer: Self-pay

## 2015-08-21 DIAGNOSIS — R197 Diarrhea, unspecified: Secondary | ICD-10-CM | POA: Diagnosis not present

## 2015-08-21 DIAGNOSIS — R1010 Upper abdominal pain, unspecified: Secondary | ICD-10-CM

## 2015-08-21 DIAGNOSIS — R109 Unspecified abdominal pain: Secondary | ICD-10-CM

## 2015-08-21 DIAGNOSIS — K2100 Gastro-esophageal reflux disease with esophagitis, without bleeding: Secondary | ICD-10-CM

## 2015-08-21 NOTE — Progress Notes (Signed)
ON RECALL  °

## 2015-08-21 NOTE — Assessment & Plan Note (Signed)
SYMPTOMS NOT CONTROLLED. DIFFERENTIAL DIAGNOSIS INCLUDES: BILE SALT INDUCED DIARRHEA, IBS-FLARE, LESS LIKLEY C DIFF COLITIS, OR.SMALL INTESTINE BACTERIAL OVERGROWTH.   COMPLETE LABS AND STOOL STUDIES. COMPLETE THE HYDROGEN BREATH TEST TO EVALUATE FOR THE WRONG BACTERIA MIX IN HER SMALL BOWEL. AVOID DIARY AND FOLLOW A LOW FAT DIET.  HANDOUT GIVEN. CHEW ONE TUMS WITH MEALS THREE TIMES A  DAY TO PREVENT DIARRHEA/ABDOMINAL PAIN. PLEASE CALL IN 3 WEEKS IF YOUR SYMPTOMS ARE NOT IMPROVED. YOU MAY NEED VIBERZI. FOLLOW UP IN 3 MOS.

## 2015-08-21 NOTE — Patient Instructions (Addendum)
COMPLETE LABS AND STOOL STUDIES.  COMPLETE THE HYDROGEN BREATH TEST TO EVALUATE FOR THE WRONG BACTERIA MIX IN HER SMALL BOWEL.  AVOID DIARY AND FOLLOW A LOW FAT DIET. SEE INFO BELOW.  CHEW ONE TUMS WITH MEALS THREE TIMES A  DAY TO PREVENT DIARRHEA/ABDOMINAL PAIN.  PLEASE CALL IN 3 WEEKS IF YOUR SYMPTOMS ARE NOT IMPROVED. YOU MAY NEED VIBERZI.  FOLLOW UP IN 3 MOS.   Lactose Free Diet Lactose is a carbohydrate that is found mainly in milk and milk products, as well as in foods with added milk or whey. Lactose must be digested by the enzyme in order to be used by the body. Lactose intolerance occurs when there is a shortage of lactase. When your body is not able to digest lactose, you may feel sick to your stomach (nausea), bloating, cramping, gas and diarrhea.  There are many dairy products that may be tolerated better than milk by some people:  The use of cultured dairy products such as yogurt, buttermilk, cottage cheese, and sweet acidophilus milk (Kefir) for lactase-deficient individuals is usually well tolerated. This is because the healthy bacteria help digest lactose.   Lactose-hydrolyzed milk (Lactaid) contains 40-90% less lactose than milk and may also be well tolerated.    SPECIAL NOTES  Lactose is a carbohydrates. The major food source is dairy products. Reading food labels is important. Many products contain lactose even when they are not made from milk. Look for the following words: whey, milk solids, dry milk solids, nonfat dry milk powder. Typical sources of lactose other than dairy products include breads, candies, cold cuts, prepared and processed foods, and commercial sauces and gravies.   All foods must be prepared without milk, cream, or other dairy foods.   Soy milk and lactose-free supplements (LACTASE) may be used as an alternative to milk.   FOOD GROUP ALLOWED/RECOMMENDED AVOID/USE SPARINGLY  BREADS / STARCHES 4 servings or more* Breads and rolls made without  milk. [place], [place], or [place] bread. Breads and rolls that contain milk. Prepared mixes such as muffins, biscuits, waffles, pancakes. Sweet rolls, donuts, [place] toast (if made with milk or lactose).  Crackers: Soda crackers, graham crackers. Any crackers prepared without lactose. Zwieback crackers, corn curls, or any that contain lactose.  Cereals: Cooked or dry cereals prepared without lactose (read labels). Cooked or dry cereals prepared with lactose (read labels). Total, Cocoa Krispies. Special K.  Potatoes / Pasta / Rice: Any prepared without milk or lactose. Popcorn. Instant potatoes, frozen [place] fries, scalloped or au gratin potatoes.  VEGETABLES 2 servings or more Fresh, frozen, and canned vegetables. Creamed or breaded vegetables. Vegetables in a cheese sauce or with lactose-containing margarines.  FRUIT 2 servings or more All fresh, canned, or frozen fruits that are not processed with lactose. Any canned or frozen fruits processed with lactose.  MEAT & SUBSTITUTES 2 servings or more (4 to 6 oz. total per day) Plain beef, chicken, fish, [place], lamb, veal, pork, or ham. Kosher prepared meat products. Strained or junior meats that do not contain milk. Eggs, soy meat substitutes, nuts. Scrambled eggs, omelets, and souffles that contain milk. Creamed or breaded meat, fish, or fowl. Sausage products such as wieners, liver sausage, or cold cuts that contain milk solids. Cheese, cottage cheese, or cheese spreads.  MILK None. (See "BEVERAGES" for milk substitutes. See "DESSERTS" for ice cream and frozen desserts.) Milk (whole, 2%, skim, or chocolate). Evaporated, powdered, or condensed milk; malted milk.  SOUPS & COMBINATION FOODS Bouillon, broth, vegetable soups,  clear soups, consomms. Homemade soups made with allowed ingredients. Combination or prepared foods that do not contain milk or milk products (read labels). Cream soups, chowders, commercially prepared soups containing lactose.  Macaroni and cheese, pizza. Combination or prepared foods that contain milk or milk products.  DESSERTS & SWEETS In moderation Water and fruit ices; gelatin; angel food cake. Homemade cookies, pies, or cakes made from allowed ingredients. Pudding (if made with water or a milk substitute). Lactose-free tofu desserts. Sugar, honey, corn syrup, jam, jelly; marmalade; molasses (beet sugar); Pure sugar candy; marshmallows. Ice cream, ice milk, sherbet, custard, pudding, frozen yogurt. Commercial cake and cookie mixes. Desserts that contain chocolate. Pie crust made with milk-containing margarine; reduced-calorie desserts made with a sugar substitute that contains lactose. Toffee, peppermint, butterscotch, chocolate, caramels.  FATS & OILS In moderation Butter (as tolerated; contains very small amounts of lactose). Margarines and dressings that do not contain milk, Vegetable oils, shortening, Miracle Whip, mayonnaise, nondairy cream & whipped toppings without lactose or milk solids added (examples: Coffee Rich, Carnation Coffeemate, Rich's Whipped Topping, PolyRich). Berniece Salines. Margarines and salad dressings containing milk; cream, cream cheese; peanut butter with added milk solids, sour cream, chip dips, made with sour cream.  BEVERAGES Carbonated drinks; tea; coffee and freeze-dried coffee; some instant coffees (check labels). Fruit drinks; fruit and vegetable juice; Rice or Soy milk. Ovaltine, hot chocolate. Some cocoas; some instant coffees; instant iced teas; powdered fruit drinks (read labels).   CONDIMENTS / MISCELLANEOUS Soy sauce, carob powder, olives, gravy made with water, baker's cocoa, pickles, pure seasonings and spices, wine, pure monosodium glutamate, catsup, mustard. Some chewing gums, chocolate, some cocoas. Certain antibiotics and vitamin / mineral preparations. Spice blends if they contain milk products. MSG extender. Artificial sweeteners that contain lactose such as Equal (Nutra-Sweet) and  Sweet 'n Low. Some nondairy creamers (read labels).   SAMPLE MENU*  Breakfast   Orange Juice.  Banana.   Bran flakes.   Nondairy Creamer.  [place] Bread (toasted).   Butter or milk-free margarine.   Coffee or tea.    Noon Meal   Chicken Breast.  Rice.   Green beans.   Butter or milk-free margarine.  Fresh melon.   Coffee or tea.    Evening Meal   Roast Beef.  Baked potato.   Butter or milk-free margarine.   Broccoli.   Lettuce salad with vinegar and oil dressing.  [employer]   Coffee or tea.     Low-Fat Diet BREADS, CEREALS, PASTA, RICE, DRIED PEAS, AND BEANS These products are high in carbohydrates and most are low in fat. Therefore, they can be increased in the diet as substitutes for fatty foods. They too, however, contain calories and should not be eaten in excess. Cereals can be eaten for snacks as well as for breakfast.   FRUITS AND VEGETABLES It is good to eat fruits and vegetables. Besides being sources of fiber, both are rich in vitamins and some minerals. They help you get the daily allowances of these nutrients. Fruits and vegetables can be used for snacks and desserts.  MEATS Limit lean meat, chicken, [place], and fish to no more than 6 ounces per day. Beef, Pork, and Lamb Use lean cuts of beef, pork, and lamb. Lean cuts include:  Extra-lean ground beef.  Arm roast.  Sirloin tip.  Center-cut ham.  Round steak.  Loin chops.  Rump roast.  Tenderloin.  Trim all fat off the outside of meats before cooking. It is not necessary to severely decrease  the intake of red meat, but lean choices should be made. Lean meat is rich in protein and contains a highly absorbable form of iron. Premenopausal women, in particular, should avoid reducing lean red meat because this could increase the risk for low red blood cells (iron-deficiency anemia).  Chicken and [place] These are good sources of protein. The fat of poultry can be reduced by removing  the skin and underlying fat layers before cooking. Chicken and [place] can be substituted for lean red meat in the diet. Poultry should not be fried or covered with high-fat sauces. Fish and Shellfish Fish is a good source of protein. Shellfish contain cholesterol, but they usually are low in saturated fatty acids. The preparation of fish is important. Like chicken and [place], they should not be fried or covered with high-fat sauces. EGGS Egg whites contain no fat or cholesterol. They can be eaten often. Try 1 to 2 egg whites instead of whole eggs in recipes or use egg substitutes that do not contain yolk. MILK AND DAIRY PRODUCTS Use skim or 1% milk instead of 2% or whole milk. Decrease whole milk, natural, and processed cheeses. Use nonfat or low-fat (2%) cottage cheese or low-fat cheeses made from vegetable oils. Choose nonfat or low-fat (1 to 2%) yogurt. Experiment with evaporated skim milk in recipes that call for heavy cream. Substitute low-fat yogurt or low-fat cottage cheese for sour cream in dips and salad dressings. Have at least 2 servings of low-fat dairy products, such as 2 glasses of skim (or 1%) milk each day to help get your daily calcium intake. FATS AND OILS Reduce the total intake of fats, especially saturated fat. Butterfat, lard, and beef fats are high in saturated fat and cholesterol. These should be avoided as much as possible. Vegetable fats do not contain cholesterol, but certain vegetable fats, such as coconut oil, palm oil, and palm kernel oil are very high in saturated fats. These should be limited. These fats are often used in bakery goods, processed foods, popcorn, oils, and nondairy creamers. Vegetable shortenings and some peanut butters contain hydrogenated oils, which are also saturated fats. Read the labels on these foods and check for saturated vegetable oils. Unsaturated vegetable oils and fats do not raise blood cholesterol. However, they should be limited because they are  fats and are high in calories. Total fat should still be limited to 30% of your daily caloric intake. Desirable liquid vegetable oils are corn oil, cottonseed oil, olive oil, canola oil, safflower oil, soybean oil, and sunflower oil. Peanut oil is not as good, but small amounts are acceptable. Buy a heart-healthy tub margarine that has no partially hydrogenated oils in the ingredients. Mayonnaise and salad dressings often are made from unsaturated fats, but they should also be limited because of their high calorie and fat content. Seeds, nuts, peanut butter, olives, and avocados are high in fat, but the fat is mainly the unsaturated type. These foods should be limited mainly to avoid excess calories and fat. OTHER EATING TIPS Snacks  Most sweets should be limited as snacks. They tend to be rich in calories and fats, and their caloric content outweighs their nutritional value. Some good choices in snacks are graham crackers, melba toast, soda crackers, bagels (no egg), English muffins, fruits, and vegetables. These snacks are preferable to snack crackers, [place] fries, TORTILLA CHIPS, and POTATO chips. Popcorn should be air-popped or cooked in small amounts of liquid vegetable oil. Desserts Eat fruit, low-fat yogurt, and fruit ices instead of  pastries, cake, and cookies. Sherbet, angel food cake, gelatin dessert, frozen low-fat yogurt, or other frozen products that do not contain saturated fat (pure fruit juice bars, frozen ice pops) are also acceptable.  COOKING METHODS Choose those methods that use little or no fat. They include: Poaching.  Braising.  Steaming.  Grilling.  Baking.  Stir-frying.  Broiling.  Microwaving.  Foods can be cooked in a nonstick pan without added fat, or use a nonfat cooking spray in regular cookware. Limit fried foods and avoid frying in saturated fat. Add moisture to lean meats by using water, broth, cooking wines, and other nonfat or low-fat sauces along with the cooking  methods mentioned above. Soups and stews should be chilled after cooking. The fat that forms on top after a few hours in the refrigerator should be skimmed off. When preparing meals, avoid using excess salt. Salt can contribute to raising blood pressure in some people.  EATING AWAY FROM HOME Order entres, potatoes, and vegetables without sauces or butter. When meat exceeds the size of a deck of cards (3 to 4 ounces), the rest can be taken home for another meal. Choose vegetable or fruit salads and ask for low-calorie salad dressings to be served on the side. Use dressings sparingly. Limit high-fat toppings, such as bacon, crumbled eggs, cheese, sunflower seeds, and olives. Ask for heart-healthy tub margarine instead of butter.

## 2015-08-21 NOTE — Assessment & Plan Note (Signed)
SYMPTOMS FAIRLY WELL CONTROLLED IF SHE AVOID TRIGGERS AND TAKE [facility]IC[facility].  CONTINUE PROTONIX. TAKE 30 MINUTES PRIOR TO MEALS TWICE DAILY. CONTINUE TO MONITOR SYMPTOMS.

## 2015-08-21 NOTE — Progress Notes (Signed)
CC'ED TO PCP 

## 2015-08-21 NOTE — Assessment & Plan Note (Addendum)
ASSOCIATED WITH CONSTIPATION/DIARRHEA 8 ASOCIATED WITH UNCONTROLLED DANXIETY. DIFFERENTIAL DIAGNOSIS INCLUDES: UNCONTROLLED GERD, IBS-D, LESS LIKELY C DIFF COLITIS OR PUD. RECENT CT REVIEWED.   COMPLETE LABS AND STOOL STUDIES. COMPLETE THE HYDROGEN BREATH TEST TO EVALUATE FOR THE WRONG BACTERIA MIX IN HER SMALL BOWEL. AVOID DIARY AND FOLLOW A LOW FAT DIET.  HANDOUT GIVEN. CHEW ONE TUMS WITH MEALS THREE TIMES A  DAY TO PREVENT DIARRHEA/ABDOMINAL PAIN. PLEASE CALL IN 3 WEEKS IF YOUR SYMPTOMS ARE NOT IMPROVED. YOU MAY NEED VIBERZI. FOLLOW UP IN 3 MOS.

## 2015-08-21 NOTE — Progress Notes (Signed)
Subjective:    Patient ID: Ruth Duncan, female    DOB: 1970-01-12, 45 y.o.   MRN: [id number]  KAPLAN,KRISTEN, PA-C   HPI RECENTLY RETURNED FROM [place] AND [place]. STILL HAVING SHARP-CRAMPING PAINS IN HER UPPER ABDOMEN WHEN SHE EATS CERTAIN FOODS AND MOVES TO RIGHT BACK. HITS AND CALMS DOWN AND HITS AGAIN. NOT QUITE AS INTENSE AS CHILD BIRTH. JUST DEPENDS ON DAY AND TIME. TRIED TO PIN POINT A FOOD BUT CAN'T. SYMPTOMS FOR A YEAR: POSSIBLY GETTING WORSE-MORE FREQUENT/INTENSE. SYMPTOMS LAST FOR 3-4 HOURS. BETTER: TIME. TIRED PEPTO/YLANTA-NO HELP. ASSOCIATED WITH NASUEA/VOMTIING(NO BLOOD)/SWEATS. NO WEIGHT LOSS. APPETITE: SAME. EPISODE OCCUR: 1X/WEEK AND USED TO BE ABOUT ONCE Q3-4 WEEKS. MILK: NONE. ICE CRAM: RARE CHEESE: 1-2X/WEEK. REPORTS BLACK AND STICKY STOOLS. SAW THAT 3 WEEKS AGO IN [place] AND ASSOCIATED WITH CONSTIPATION. TOOK PEPTO IN [place]. SAW BRB 3 MOS AGO-ACTUALLY IN THE COMMODE ASSOCIATED WITH ONE OF THE EPISODES. AFTER EPISODE GETS WATERY AND GREEN. ABX: MACRODANTIN TO PREVENT UTI. HEARTBURN: ABOUT EVERY TIME SHE EATS. PROTONIX MAKES HER HEARTBURN BEARABLE. CONSTIPATION MAY HAPPEN AFTER EPISODE OR WHEN SHE TRAVELS. ANXIETY/STRESS NOT CONTROLLED. NO NEW MEDS. PT DENIES FEVER, CHILLS, HEMATOCHEZIA, melena, DYSURIA OR HEMATURIA, CHEST PAIN, SHORTNESS OF BREATH, CHANGE IN BOWEL IN HABITS, OR problems swallowing.  Past Medical History:  Diagnosis Date  . Bipolar disorder ([place])   . Chronic abdominal pain    hx of IBS-D, ? bile salt-induced diarrhea  . Chronic eczema of hand 2012   BILATERAL ON MTX SINCE 2014  . Complication of anesthesia   . Diverticulitis   . Ovarian cancer ([place]) 2010  . Surgical menopause 2010  . Tubular adenoma 2010   Past Surgical History:  Procedure Laterality Date  . ABDOMINAL HYSTERECTOMY  2010  . APPENDECTOMY  2004  . CESAREAN SECTION  1998  . COLONOSCOPY  12/2008   tubular adenomas, negative microscopic colitis. Due for Flex Sig Dec 2015  .  ESOPHAGOGASTRODUODENOSCOPY  05/06/2008   [id number] esophagus without evidence of Barrett's, mass, erosion, ulceration or stricture/ Hiatal hernia/ Normal duodenal bulb and second portion of the duodenum  . ESOPHAGOGASTRODUODENOSCOPY N/A 10/04/2014   Dr. Oneida Alar: patent stricture at GE junction, no dilation, small hiatal hernia, mild non-erosive gastritis (benign), multiple small gastric polyps, benign. Capsule study recommended but never completed   . FLEXIBLE SIGMOIDOSCOPY N/A 02/18/2014   SL[facility]: Normal small bowel. formed stool at anastomosis. Stool cleared with irrigation. One superfical erosion at the anastomosis. otherwise normal. normal rectal mucosa  . SUBTOTAL COLECTOMY  2011  . TUBAL LIGATION     No Known Allergies  Current Outpatient Prescriptions  Medication Sig Dispense Refill  . ALPRAZolam (XANAX) 0.5 MG tablet Take 1 tablet (0.5 mg total) by mouth at bedtime.    [name] amphetamine-dextroamphetamine (ADDERALL) 10 MG tablet Take 1 tablet (10 mg total) by mouth daily.    . benztropine (COGENTIN) 0.5 MG tablet TAKE 1 TABLET (0.5 MG TOTAL) BY MOUTH AT BEDTIME.    [name] escitalopram (LEXAPRO) 10 MG tablet TAKE 1 TABLET (10 MG TOTAL) BY MOUTH DAILY.    . haloperidol (HALDOL) 2 MG tablet Take 1 tab in am and 3 at bed time    . hydrOXYzine (VISTARIL) 25 MG capsule TAKE 1 CAPSULE (25 MG TOTAL) BY MOUTH AT BEDTIME.    [name] lamoTRIgine (LAMICTAL) 150 MG tablet Take 1 tablet (150 mg total) by mouth daily.    . methotrexate (RHEUMATREX) 2.5 MG tablet Take 12.5 mg by mouth daily as needed. thursday    .  nitrofurantoin (MACRODANTIN) 50 MG capsule Take 50 mg by mouth at bedtime.     . ondansetron (ZOFRAN) 4 MG tablet Take 1 tablet (4 mg total) by mouth every 8 (eight) hours as needed for nausea or vomiting.    . pantoprazole (PROTONIX) 40 MG tablet 1 PO 30 MINS BEFORE BREAKFAST AND SUPPER    . promethazine (PHENERGAN) 25 MG tablet Take 1 tablet (25 mg total) by mouth every 6 (six) hours as needed for nausea or  vomiting.    . valACYclovir (VALTREX) 500 MG tablet Take 500 mg by mouth once a week.     .       Review of Systems PER HPI OTHERWISE ALL SYSTEMS ARE NEGATIVE.     Objective:   Physical Exam  Constitutional: She is oriented to person, place, and time. She appears well-developed and well-nourished. No distress.  HENT:  Head: Normocephalic and atraumatic.  Mouth/Throat: Oropharynx is clear and moist. No oropharyngeal exudate.  Eyes: Pupils are equal, round, and reactive to light. No scleral icterus.  Neck: Normal range of motion. Neck supple.  Cardiovascular: Normal rate, regular rhythm and normal heart sounds.   Pulmonary/Chest: Effort normal and breath sounds normal. No respiratory distress.  Abdominal: Soft. Bowel sounds are normal. She exhibits no distension. There is tenderness. There is no rebound and no guarding.  MILD TTP IN THE EPIGASTRIUM and RUQ  Musculoskeletal: She exhibits no edema.  Lymphadenopathy:    She has no cervical adenopathy.  Neurological: She is alert and oriented to person, place, and time.  NO FOCAL DEFICITS  Psychiatric:  SLIGHTLY ANXIOUS MOOD, NL AFFECT  Vitals reviewed.     Assessment & Plan:

## 2015-08-22 ENCOUNTER — Telehealth (HOSPITAL_COMMUNITY): Payer: Self-pay

## 2015-08-22 ENCOUNTER — Other Ambulatory Visit (HOSPITAL_COMMUNITY): Payer: Self-pay

## 2015-08-22 DIAGNOSIS — F251 Schizoaffective disorder, depressive type: Secondary | ICD-10-CM

## 2015-08-22 MED ORDER — HALOPERIDOL 2 MG PO TABS: ORAL_TABLET | ORAL | 0 refills | Status: DC

## 2015-08-22 MED ORDER — ALPRAZOLAM 0.5 MG PO TABS: 0.5000 mg | ORAL_TABLET | Freq: Every day | ORAL | 2 refills | Status: DC

## 2015-08-22 NOTE — Telephone Encounter (Signed)
Arfeen patient calling, she had to reschedule appt to October due to provider being out. Patient needs a refill on Haldol, Xanax, and Adderall. I checked the chart and these refills are appropriate. I called in the Haldol and the xanax, but I will need a signature for the Adderall, please review and advise, thank you

## 2015-08-25 ENCOUNTER — Ambulatory Visit (HOSPITAL_COMMUNITY): Payer: Self-pay | Admitting: Psychiatry

## 2015-08-26 NOTE — Telephone Encounter (Signed)
That's fine. I will sign

## 2015-08-28 ENCOUNTER — Telehealth: Payer: Self-pay | Admitting: Gastroenterology

## 2015-08-28 NOTE — Telephone Encounter (Signed)
I spoke with the pt, she has not received her instructions in the mail yet. I went over the date and time of the hydrogen breath test and went over instructions with her while she wrote it down. She is currently taking microbid QD for a UTI.   Will this be ok for the hydrogen breath test?

## 2015-08-28 NOTE — Telephone Encounter (Signed)
NO SHE NEEDS TO STOP ABX FOR THE HYDROGEN BREATH TEST.

## 2015-08-28 NOTE — Telephone Encounter (Signed)
Pt is aware.  

## 2015-08-28 NOTE — Telephone Encounter (Signed)
Pt called to say that she was scheduled a procedure for Monday with [facility] and hasn't been told what type of procedure or given any instructions and needs to find out what she needs to do before then. Please call her at [phone]

## 2015-09-01 ENCOUNTER — Other Ambulatory Visit: Payer: Self-pay | Admitting: Gastroenterology

## 2015-09-01 ENCOUNTER — Ambulatory Visit (INDEPENDENT_AMBULATORY_CARE_PROVIDER_SITE_OTHER): Payer: Medicare Other | Admitting: Psychology

## 2015-09-01 ENCOUNTER — Encounter (HOSPITAL_COMMUNITY)
Admission: RE | Disposition: A | Discharge status: Home / Self Care | Payer: Self-pay | Source: Ambulatory Visit | Attending: Gastroenterology

## 2015-09-01 ENCOUNTER — Ambulatory Visit (HOSPITAL_COMMUNITY)
Admission: RE | Admit: 2015-09-01 | Discharge: 2015-09-01 | Disposition: A | Discharge status: Home / Self Care | Payer: Medicare Other | Source: Ambulatory Visit | Attending: Gastroenterology | Admitting: Gastroenterology

## 2015-09-01 DIAGNOSIS — R197 Diarrhea, unspecified: Secondary | ICD-10-CM | POA: Insufficient documentation

## 2015-09-01 DIAGNOSIS — R140 Abdominal distension (gaseous): Secondary | ICD-10-CM | POA: Insufficient documentation

## 2015-09-01 DIAGNOSIS — F251 Schizoaffective disorder, depressive type: Secondary | ICD-10-CM

## 2015-09-01 DIAGNOSIS — R109 Unspecified abdominal pain: Secondary | ICD-10-CM | POA: Insufficient documentation

## 2015-09-01 HISTORY — PX: BACTERIAL OVERGROWTH TEST: SHX5739

## 2015-09-01 LAB — CBC WITH DIFFERENTIAL/PLATELET
Basophils Absolute: 62 cells/uL (ref 0–200)
Basophils Relative: 1 %
Eosinophils Absolute: 310 cells/uL (ref 15–500)
Eosinophils Relative: 5 %
HCT: 39.4 % (ref 35.0–45.0)
Hemoglobin: 13.2 g/dL (ref 11.7–15.5)
Lymphocytes Relative: 31 %
Lymphs Abs: 1922 cells/uL (ref 850–3900)
MCH: 30.3 pg (ref 27.0–33.0)
MCHC: 33.5 g/dL (ref 32.0–36.0)
MCV: 90.4 fL (ref 80.0–100.0)
MPV: 10.4 fL (ref 7.5–12.5)
Monocytes Absolute: 496 cells/uL (ref 200–950)
Monocytes Relative: 8 %
Neutro Abs: 3410 cells/uL (ref 1500–7800)
Neutrophils Relative %: 55 %
Platelets: 271 10*3/uL (ref 140–400)
RBC: 4.36 MIL/uL (ref 3.80–5.10)
RDW: 12.7 % (ref 11.0–15.0)
WBC: 6.2 10*3/uL (ref 3.8–10.8)

## 2015-09-01 SURGERY — BREATH TEST, FOR INTESTINAL BACTERIAL OVERGROWTH

## 2015-09-01 MED ORDER — LACTULOSE 10 GM/15ML PO SOLN
ORAL | Status: AC
  Filled 2015-09-01: qty 60

## 2015-09-01 MED ORDER — LACTULOSE 10 GM/15ML PO SOLN
25.0000 g | Freq: Once | ORAL | Status: AC
  Administered 2015-09-01: 25 g via ORAL

## 2015-09-01 NOTE — OR Nursing (Signed)
No beans, bran or high fiber cereal the day before the procedure? NO NPO except for water 12 hours before procedure? NPO since 1843 on 08/31/2015 No smoking, sleeping or vigorous exercising for at least 30 before procedure? NO Recent antibiotic use and/or diarrhea? No nitrofurantoin since Friday 08/29/15. Denies diarrhea.   If yes, physician notified.  Time Baseline 15 mins 30 mins 45 mins 60 mins 75 mins 90 mins 105 mins 120 mins 135 mins 150 mins 165 mins 180 mins  H2-ppm 0 3 2 7 13 11 10 9  14   11 18 15  20

## 2015-09-02 ENCOUNTER — Encounter (HOSPITAL_COMMUNITY): Payer: Self-pay | Admitting: Psychology

## 2015-09-02 ENCOUNTER — Telehealth: Payer: Self-pay

## 2015-09-02 LAB — CLOSTRIDIUM DIFFICILE BY PCR: Toxigenic C. Difficile by PCR: DETECTED — CR

## 2015-09-02 MED ORDER — METRONIDAZOLE 500 MG PO TABS: 500.0000 mg | ORAL_TABLET | Freq: Three times a day (TID) | ORAL | 0 refills | Status: AC

## 2015-09-02 NOTE — Telephone Encounter (Signed)
PT is aware. She would like to know results of the other tests. Said she got really sick with the HBT and her mom wore gloves but was cleaning up after her, and she is concerned about her now.  She will try the Flagyl but has had problems with it making her sick in the past.  She has some nausea medicine at home but might need some more to get through the Flagyl, but she will let us know.

## 2015-09-02 NOTE — Telephone Encounter (Signed)
Lab called- pts cdiff is positive. Routing to [employer] in Dr.Fields absence.

## 2015-09-02 NOTE — Telephone Encounter (Signed)
Cdiff PCR positive. Needs to take Flagyl 500 mg po TID for 10 days. I have sent to pharmacy. Please review proper handwashing, sanitization, using bleach to clean. Specify one toilet for her to use.

## 2015-09-02 NOTE — Progress Notes (Signed)
Patient:  Ruth Duncan   DOB: [birth year]  MR Number: [id number]  Location: [facility] [address] [place] [address] Dept: [phone]  Start: 4 PM End: 5 PM  Provider/Observer:     Edgardo Roys PSYD  Chief Complaint:      Chief Complaint  Patient presents with  . Anxiety  . Stress    Reason For Service:     The patient is a 45 year old female who has been followed by the outpatient facility in [place] since April 2013. She has been diagnosed with schizoaffective disorder as well as previous diagnoses of attention deficit disorder. I reviewed the patient's history and it is consistent with a mood disorder along with hallucinations primarily auditory. The patient describes episodes of significant manic events and agitation as well as severe depressive events. She reports that she has developed suicidal ideation during significant depressive episodes. 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 The patient also has a history of alcohol abuse as well but she has been free from alcohol abuse for quite some time. The patient also quit smoking. The patient reports that she has had these symptoms since she was about 12 or 13 years as far she can remember. She has been treated in the past by other psychiatrists. She has had poor response to some medications and good response to other medications. However, she is trying to cope with guilty feelings about one of her children having autism thinking that it is her fault and feeling bad about her condition and how it affects her husband in the way his family use her.   Interventions Strategy:  Cognitive/behavioral psychotherapeutic interventions  Participation Level:   Active  Participation Quality:  Appropriate       Behavioral Observation:  Well Groomed, Alert, and Appropriate and Tearful.   Current Psychosocial Factors: The patient reports that she is doing much better. She has gone away with her friends on a vacation trip and has been coping with her husband infidelity. Always difficult to "love herself" that she is doing much better with taking care of herself especially now that her boys are often college or on the wrong..  Content of Session:   Reviewed current symptoms and work on therapeutic interventions about building better coping skills and strategies to deal with her underlying mood disorder. We looked at some foundational issues such as her sleep pattern, dietary pattern, and physical activity patterns.  Current Status:   The patient reports that she has not had any full-blown manic or depressive events recently  But has been dealing with major stress related to infidelity from her husband.  She reports that she is talking with friends and trying to figure out what to do next.  Target Goals:   Target goals include reducing intensity, severity, and duration of her mood swings related to both manic episodes as well as depressive episodes.  Last Reviewed:   08/28/2015  Goals Addressed Today:    Goals addressed today had to do with building better coping skills around managing her moods.  Impression/Diagnosis:   The patient describes a history of mood disorder that included auditory hallucinations dating back to age. She reports that these symptoms have been treated to varying degrees to the years. She reports that she has had difficulty with substance abuse related to alcohol  as a means to self medicate herself and has also gotten herself in trouble during manic episodes. She also has events where she developed suicidal ideation during times of significant depression.  Diagnosis:    Axis I: Schizoaffective disorder, depressive type ([place])   Melisse Caetano R, PsyD 09/02/2015

## 2015-09-02 NOTE — Progress Notes (Signed)
Patient:  Ruth Duncan   DOB: 1970/10/13  MR Number: [id number]  Location: [facility] [address] [place] [address] Dept: [phone]  Start: 4 PM End: 5 PM  Provider/Observer:     Edgardo Roys PSYD  Chief Complaint:      Chief Complaint  Patient presents with  . Anxiety  . Depression  . Stress    Reason For Service:     The patient is a 45 year old female who has been followed by the outpatient facility in [place] since April 2013. She has been diagnosed with schizoaffective disorder as well as previous diagnoses of attention deficit disorder. I reviewed the patient's history and it is consistent with a mood disorder along with hallucinations primarily auditory. The patient describes episodes of significant manic events and agitation as well as severe depressive events. She reports that she has developed suicidal ideation during significant depressive episodes. 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 The patient also has a history of alcohol abuse as well but she has been free from alcohol abuse for quite some time. The patient also quit smoking. The patient reports that she has had these symptoms since she was about 12 or 13 years as far she can remember. She has been treated in the past by other psychiatrists. She has had poor response to some medications and good response to other medications. However, she is trying to cope with guilty feelings about one of her children having autism thinking that it is her fault and feeling bad about her condition and how it affects her husband in the way his family use her.   Interventions Strategy:  Cognitive/behavioral psychotherapeutic interventions  Participation Level:   Active  Participation  Quality:  Appropriate      Behavioral Observation:  Well Groomed, Alert, and Appropriate and Tearful.   Current Psychosocial Factors: The patient reports that she is still struggling with what to do about her husband and whether to stay married or leave him. The patient reports that she has gotten more information about his recent infidelity and that she has really been struggling with what to do with whether she will ever be able to trust him. She reports her husband is trying but that she has always been fearful that he was cheating on her at various times and even though she knows sometimes it was not he has been called on multiple occasions over the years of marriage.  Content of Session:   Reviewed current symptoms and work on therapeutic interventions about building better coping skills and strategies to deal with her underlying mood disorder. We looked at some foundational issues such as her sleep pattern, dietary pattern, and physical activity patterns.  Current Status:   The patient reports that she has not had any full-blown manic or depressive events recently  But has been dealing with major stress related to infidelity from her husband.  She reports that she is talking with friends and trying to figure out what to do next.  Target Goals:   Target goals include reducing intensity, severity, and duration of her mood swings related to both manic episodes as well as depressive episodes.  Last Reviewed:    06/09/2015  Goals Addressed Today:    Goals addressed today had to do with building better coping skills around managing her  moods.  Impression/Diagnosis:   The patient describes a history of mood disorder that included auditory hallucinations dating back to age. She reports that these symptoms have been treated to varying degrees to the years. She reports that she has had difficulty with substance abuse related to alcohol as a means to self medicate herself and has also gotten herself in  trouble during manic episodes. She also has events where she developed suicidal ideation during times of significant depression.  Diagnosis:    Axis I: Schizoaffective disorder, depressive type ([place])  Generalized anxiety disorder   Nolan Lasser R, PsyD 09/02/2015

## 2015-09-02 NOTE — Progress Notes (Signed)
Patient:  Ruth Duncan   DOB: [birth year]  MR Number: [id number]  Location: [facility] [address] [place] [address] Dept: [phone]  Start: 1 PM End: 2 PM  Provider/Observer:     Edgardo Roys PSYD  Chief Complaint:      Chief Complaint  Patient presents with  . Anxiety  . Depression  . Stress    Reason For Service:     The patient is a 45 year old female who has been followed by the outpatient facility in [place] since April 2013. She has been diagnosed with schizoaffective disorder as well as previous diagnoses of attention deficit disorder. I reviewed the patient's history and it is consistent with a mood disorder along with hallucinations primarily auditory. The patient describes episodes of significant manic events and agitation as well as severe depressive events. She reports that she has developed suicidal ideation during significant depressive episodes. 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 The patient also has a history of alcohol abuse as well but she has been free from alcohol abuse for quite some time. The patient also quit smoking. The patient reports that she has had these symptoms since she was about 12 or 13 years as far she can remember. She has been treated in the past by other psychiatrists. She has had poor response to some medications and good response to other medications. However, she is trying to cope with guilty feelings about one of her children having autism thinking that it is her fault and feeling bad about her condition and how it affects her husband in the way his family use her.   Interventions Strategy:  Cognitive/behavioral psychotherapeutic interventions  Participation Level:   Active  Participation  Quality:  Appropriate      Behavioral Observation:  Well Groomed, Alert, and Appropriate and Tearful.   Current Psychosocial Factors: The patient reports that she has been doing better. She reports that she has been doing more things to take care of herself and feels like it is really helping. She reports that she still was not decided what she is given do with her marriage and the long-term but this point she is working on trying to just see what happens rather than feeling like she has to desperately figure out where to turn.  Content of Session:   Reviewed current symptoms and work on therapeutic interventions about building better coping skills and strategies to deal with her underlying mood disorder. We looked at some foundational issues such as her sleep pattern, dietary pattern, and physical activity patterns.  Current Status:   The patient reports that she has not had any full-blown manic or depressive events recently  But has been dealing with major stress related to infidelity from her husband.  She reports that she is talking with friends and trying to figure out what to do next.  Target Goals:   Target goals include reducing intensity, severity, and duration of her mood swings related to both manic episodes as well as depressive episodes.  Last Reviewed:    08/11/2015  Goals Addressed Today:    Goals addressed today had to do with building better coping skills around managing her moods.  Impression/Diagnosis:   The patient describes a history of mood disorder that included auditory hallucinations dating back to age. She reports that these  symptoms have been treated to varying degrees to the years. She reports that she has had difficulty with substance abuse related to alcohol as a means to self medicate herself and has also gotten herself in trouble during manic episodes. She also has events where she developed suicidal ideation during times of significant  depression.  Diagnosis:    Axis I: Schizoaffective disorder, depressive type ([place])  Generalized anxiety disorder   Perry Brucato R, PsyD 09/02/2015

## 2015-09-03 MED ORDER — ONDANSETRON HCL 4 MG PO TABS: 4.0000 mg | ORAL_TABLET | Freq: Three times a day (TID) | ORAL | 3 refills | Status: DC | PRN

## 2015-09-03 NOTE — Telephone Encounter (Signed)
Dr. Oneida Alar will review HBT but from my cursory review, it does not appear to likely be bacterial overgrowth.   If she does not feel she can take the Flagyl, let me know right away. IF she is not able to tolerate, I will need to change to Vancomycin.   I sent in Zofran.

## 2015-09-03 NOTE — Telephone Encounter (Signed)
Pt is aware and said she is doing OK on the Flagyl now and will let us know if she has problems.  She is aware of the Zofran Rx.

## 2015-09-04 ENCOUNTER — Encounter (HOSPITAL_COMMUNITY): Payer: Self-pay | Admitting: Gastroenterology

## 2015-09-04 ENCOUNTER — Other Ambulatory Visit (HOSPITAL_COMMUNITY): Payer: Self-pay

## 2015-09-04 DIAGNOSIS — F988 Other specified behavioral and emotional disorders with onset usually occurring in childhood and adolescence: Secondary | ICD-10-CM

## 2015-09-04 MED ORDER — AMPHETAMINE-DEXTROAMPHETAMINE 10 MG PO TABS: 10.0000 mg | ORAL_TABLET | Freq: Every day | ORAL | 0 refills | Status: DC

## 2015-09-04 NOTE — Telephone Encounter (Signed)
Printed for signature, called patient to come and pick up.

## 2015-09-05 NOTE — Telephone Encounter (Signed)
PLEASE CALL PT. HER HBT IS NORMAL.  SHE SHOULD:  1. COMPLETE TREATMENT FOR C DIFF. 2. FOLLOW LOW FAT/DAIRY FREE DIET. 3. CHEW ONE TUMS WITH MEALS THREE TIMES A  DAY  4. PLEASE CALL WITH QUESTIONS OR CONCERNS. 5. Follow up in NOV 2017.

## 2015-09-05 NOTE — Procedures (Signed)
PRE-OPERATIVE DIAGNOSIS:  BLOATING, ABDOMINAL PAIN, DIARRHEA  POST-OPERATIVE DIAGNOSIS: NORMAL EXAM  PROCEDURE:  Procedure(s): HYDROGEN BREATH TEST  SURGEON:  Surgeon(s): Dorothyann Peng, MD  MEDICATIONS USED:  LACTULOSE  FINDINGS: BREATHALYZER- 0 MIN(0 PPM), FIRST PEAK (13 PPM, 12 MINS), TROUGH (9 PPM 105 MINS), SECOND PEAK (14 PPM, 120 MINS). SECOND TROUGH(11 PPM, 135 MINS)  DIAGNOSIS: NORMAL EXAM  PLAN OF CARE:  1.COMPLETE TREATMENT FOR C DIFF COLITIS 2. Follow up in NOV 2017

## 2015-09-09 NOTE — Telephone Encounter (Signed)
Pt is aware.  

## 2015-09-09 NOTE — Telephone Encounter (Signed)
PLEASE CALL PT. HER BLOOD COUNT IS NORMAL. 

## 2015-09-09 NOTE — Telephone Encounter (Signed)
Reminder in epic °

## 2015-09-12 ENCOUNTER — Other Ambulatory Visit (HOSPITAL_COMMUNITY): Payer: Self-pay | Admitting: Psychiatry

## 2015-09-15 ENCOUNTER — Other Ambulatory Visit (HOSPITAL_COMMUNITY): Payer: Self-pay

## 2015-09-15 MED ORDER — ESCITALOPRAM OXALATE 10 MG PO TABS: 10.0000 mg | ORAL_TABLET | Freq: Every day | ORAL | 0 refills | Status: DC

## 2015-09-22 ENCOUNTER — Telehealth (HOSPITAL_COMMUNITY): Payer: Self-pay | Admitting: *Deleted

## 2015-09-22 NOTE — Telephone Encounter (Signed)
phone call from patient, she would like Dr. Sima Matas to call regarding a situation she needs to discuss with him.   It is important. not an emergency.

## 2015-09-24 ENCOUNTER — Other Ambulatory Visit (HOSPITAL_COMMUNITY): Payer: Self-pay | Admitting: Psychiatry

## 2015-09-30 ENCOUNTER — Other Ambulatory Visit (HOSPITAL_COMMUNITY): Payer: Self-pay | Admitting: Psychiatry

## 2015-09-30 NOTE — Telephone Encounter (Signed)
Received fax from [facility] requesting a refill for Lexapro 10mg . Per Dr. Adele Schilder, medication request is denied. Refill was electronically sent on 09/15/15 for Lexapro 10mg , #30 w/ 0 refills. Pt is schedule for a f/u appt on 10/13/15.

## 2015-10-13 ENCOUNTER — Ambulatory Visit (INDEPENDENT_AMBULATORY_CARE_PROVIDER_SITE_OTHER): Payer: Medicare Other | Admitting: Psychiatry

## 2015-10-13 ENCOUNTER — Encounter (HOSPITAL_COMMUNITY): Payer: Self-pay | Admitting: Psychiatry

## 2015-10-13 ENCOUNTER — Ambulatory Visit (HOSPITAL_COMMUNITY): Payer: Self-pay | Admitting: Psychiatry

## 2015-10-13 DIAGNOSIS — F251 Schizoaffective disorder, depressive type: Secondary | ICD-10-CM | POA: Diagnosis not present

## 2015-10-13 DIAGNOSIS — G4700 Insomnia, unspecified: Secondary | ICD-10-CM

## 2015-10-13 DIAGNOSIS — R443 Hallucinations, unspecified: Secondary | ICD-10-CM

## 2015-10-13 DIAGNOSIS — Z79899 Other long term (current) drug therapy: Secondary | ICD-10-CM | POA: Diagnosis not present

## 2015-10-13 DIAGNOSIS — R45851 Suicidal ideations: Secondary | ICD-10-CM | POA: Diagnosis not present

## 2015-10-13 MED ORDER — LAMOTRIGINE 150 MG PO TABS: 150.0000 mg | ORAL_TABLET | Freq: Every day | ORAL | 0 refills | Status: DC

## 2015-10-13 MED ORDER — BENZTROPINE MESYLATE 0.5 MG PO TABS: ORAL_TABLET | ORAL | 0 refills | Status: DC

## 2015-10-13 MED ORDER — ESCITALOPRAM OXALATE 20 MG PO TABS: 20.0000 mg | ORAL_TABLET | Freq: Every day | ORAL | 2 refills | Status: DC

## 2015-10-13 MED ORDER — HALOPERIDOL 2 MG PO TABS: ORAL_TABLET | ORAL | 0 refills | Status: DC

## 2015-10-13 NOTE — Progress Notes (Signed)
[facility] [phone] Progress Note  Ruth Duncan [id number] 45 y.o.  Chief Complaint:  "I'm hearing voices"  History of Present Illness:  Ruth Duncan came for her follow-up appointment.  She states that she has not been doing well since the last visit. Her dog passed away last week, and she has been crying. She also reports worsening in her AH; she states she has been hearing 3 voices which includes hearing "You are no good." She denies any command hallucinations. She reports feeling depressed even before her dog passed away. She endorses chronic passive SI, although she denies any intent or plans. She reports she has been "paranoid" thinking that other people might be talking about her, or the police might get her. She does not feel safe at home, although she feels comfortable living with her husband and 2 children. She reports she has had these symptoms for years. She has VH of seeing shadow for years. She sleeps only 2-3 hours with some naps, which she says has been chronic as well. She works at 7am-8pm, and occasionally needed to miss her work due to her mood symptoms. She states that her insurance company does not cover Adderall anymore and would like Concerta to be prescribed. She takes Xanax only occasionally, as it causes her severe drowsiness. She denies tremors. She has previous suicide attempt in 2000's when she overdosed medication.   Suicidal Ideation: Yes Plan Formed: No Patient has means to carry out plan: No  Homicidal Ideation: No Plan Formed: No Patient has means to carry out plan: No  Review of Systems  Cardiovascular: Negative for chest pain and palpitations.  Musculoskeletal: Negative.   Neurological: Negative for tremors and headaches.  Psychiatric/Behavioral: Positive for depression, hallucinations and suicidal ideas. Negative for substance abuse. The patient is nervous/anxious and has insomnia.    Psychiatric: Agitation: No Hallucination: Yes Depressed Mood:  Yes Insomnia: Yes Hypersomnia: No Altered Concentration: No Feels Worthless: No Grandiose Ideas: No Belief In Special Powers: No New/Increased Substance Abuse: No Compulsions: No  Neurologic: Headache: No Seizure: No Paresthesias: No  Past Medical History:  Her primary care physician is at [facility]. She has history of diverticulitis, colon cancer an ovarian cancer.   Outpatient Encounter Prescriptions as of 10/13/2015  Medication Sig Dispense Refill  . ALPRAZolam (XANAX) 0.5 MG tablet Take 1 tablet (0.5 mg total) by mouth at bedtime. 30 tablet 2  . amphetamine-dextroamphetamine (ADDERALL) 10 MG tablet Take 1 tablet (10 mg total) by mouth daily. 30 tablet 0  . benztropine (COGENTIN) 0.5 MG tablet TAKE 1 TABLET (0.5 MG TOTAL) BY MOUTH AT BEDTIME. 60 tablet 0  . escitalopram (LEXAPRO) 20 MG tablet Take 1 tablet (20 mg total) by mouth daily. 30 tablet 2  . haloperidol (HALDOL) 2 MG tablet Take 1 tab in am and 3 at bed time 240 tablet 0  . hydrOXYzine (VISTARIL) 25 MG capsule TAKE 1 CAPSULE (25 MG TOTAL) BY MOUTH AT BEDTIME. 30 capsule 0  . lamoTRIgine (LAMICTAL) 150 MG tablet Take 1 tablet (150 mg total) by mouth daily. 90 tablet 0  . methotrexate (RHEUMATREX) 2.5 MG tablet Take 12.5 mg by mouth daily as needed. thursday    . nitrofurantoin (MACRODANTIN) 50 MG capsule Take 50 mg by mouth at bedtime.   5  . ondansetron (ZOFRAN) 4 MG tablet Take 1 tablet (4 mg total) by mouth every 8 (eight) hours as needed for nausea or vomiting. 90 tablet 3  . pantoprazole (PROTONIX) 40 MG tablet 1  PO 30 MINS BEFORE BREAKFAST AND SUPPER 60 tablet 11  . promethazine (PHENERGAN) 25 MG tablet Take 1 tablet (25 mg total) by mouth every 6 (six) hours as needed for nausea or vomiting. 40 tablet 1  . valACYclovir (VALTREX) 500 MG tablet Take 500 mg by mouth once a week.     . [DISCONTINUED] benztropine (COGENTIN) 0.5 MG tablet TAKE 1 TABLET (0.5 MG TOTAL) BY MOUTH AT BEDTIME. 90 tablet 0   . [DISCONTINUED] escitalopram (LEXAPRO) 10 MG tablet Take 1 tablet (10 mg total) by mouth daily. 30 tablet 0  . [DISCONTINUED] haloperidol (HALDOL) 2 MG tablet Take 1 tab in am and 3 at bed time 360 tablet 0  . [DISCONTINUED] lamoTRIgine (LAMICTAL) 150 MG tablet Take 1 tablet (150 mg total) by mouth daily. 90 tablet 0   No facility-administered encounter medications on file as of 10/13/2015.    Recent Results (from the past 2160 hour(s))  Clostridium Difficile by PCR     Status: Abnormal   Collection Time: 09/01/15 10:46 AM  Result Value Ref Range   Toxigenic C Difficile by pcr Detected (AA) Not Detected    Comment: This test is for use only with liquid or soft stools; performance characteristics of other clinical specimen types have not been established.   This assay was performed by Cepheid GeneXpert(R) PCR. The performance characteristics of this assay have been determined by [employer]. Performance characteristics refer to the analytical performance of the test.   CBC with Differential/Platelet     Status: None   Collection Time: 09/01/15 10:46 AM  Result Value Ref Range   WBC 6.2 3.8 - 10.8 K/uL   RBC 4.36 3.80 - 5.10 MIL/uL   Hemoglobin 13.2 11.7 - 15.5 g/dL   HCT 39.4 35.0 - 45.0 %   MCV 90.4 80.0 - 100.0 fL   MCH 30.3 27.0 - 33.0 pg   MCHC 33.5 32.0 - 36.0 g/dL   RDW 12.7 11.0 - 15.0 %   Platelets 271 140 - 400 K/uL   MPV 10.4 7.5 - 12.5 fL   Neutro Abs 3,410 1,500 - 7,800 cells/uL   Lymphs Abs 1,922 850 - 3,900 cells/uL   Monocytes Absolute 496 200 - 950 cells/uL   Eosinophils Absolute 310 15 - 500 cells/uL   Basophils Absolute 62 0 - 200 cells/uL   Neutrophils Relative % 55 %   Lymphocytes Relative 31 %   Monocytes Relative 8 %   Eosinophils Relative 5 %   Basophils Relative 1 %   Smear Review Criteria for review not met     Past Psychiatric History/Hospitalization(s): Patient has history of inpatient treatment after taking overdose on her  medication.  She has history of using drugs and diagnosed with schizoaffective disorder and ADD .  In the past she had tried Abilify, lithium, Vyvanse, Fanpat and Seroquel.  She used to see Dr. Toy Care, [facility].  She is seeing outpatient services in this office since April 2013.  Patient has [facility] IOP program in the past.    Anxiety: Yes Bipolar Disorder: No Depression: Yes Mania: No Psychosis: Yes Schizophrenia: Yes Personality Disorder: No Hospitalization for psychiatric illness: Yes History of Electroconvulsive Shock Therapy: No Prior Suicide Attempts: No  Physical Exam: Constitutional:  BP 122/68   Pulse 74   Ht '5\' 3"'  (1.6 m)   Wt 129 lb 9.6 oz (58.8 kg)   BMI 22.96 kg/m   General Appearance: well nourished  Musculoskeletal: Strength & Muscle Tone: within normal limits  Gait & Station: normal Patient leans: N/A  Mental status examination . Patient is casually dressed and fairly groomed.  She is anxious but cooperative.  She maintained good eye contact.  Her speech is fast, but not pressured.  She described her mood depressed and her affect is tense. She reports passive SI, but denies any intent, plans. She denies HI. She reports AH of hearing voices to tell "you are no good" but denies CAH. She has VH of seeing a shadow. She has paranoia as above. Her attention and concentration is fair.  Her psychomotor activity is slightly increased. Her fund of knowledge is adequate.  She has no tremors, rigidity.  Her cognition is good.  She's alert and oriented 3.  Her insight judgment and impulse control is fair.  Established Problem, Stable/Improving (1), Review of Psycho-Social Stressors (1), Review of Last Therapy Session (1), Review of Medication Regimen & Side Effects (2) and Review of New Medication or Change in Dosage (2)  Assessment: AMARANTA MEHL is a 45 year old female with schizoaffective disorder, generalized anxiety disorder, ADHD combined type, colon cancer, ovarian  cancer who presents for follow up appointment. She is one of Dr. Marguerite Olea patients.   # Schizoaffective disorder Patient reports chronic paranoia, AH/VH and gradually worsening depression prior to her dog's death last week. Will  increase escitalopram to target her mood. May consider increasing haldol or switching to other antipsychotics if her psychotic symptoms persists. Noted that patient reports her preference to avoid any medication which can cause weight gain. She has not been taking  Adderall due to limited coverage by her insurance, and requests switching to Concerta;  will defer this decision to Dr. Adele Schilder, given stimulant can worsen her psychotic symptoms. Patient also reports significant somnolence after taking Xanax; advised to take only prn and may consider switching to lorazepam at lower dose in the future.   Plan:  - Increase escitalopram 20 mg daily - Patient has not been taking Adderall - Continue Haldol 2 mg qam, 6 mg qhs - Continue benztropine 0.5 mg qhs - Continue lamotrigine 150 mg daily - Continue hydroxyzine 25 mg qhs - Patient is on Xanax 0.5 mg qhs prn - RTC in clinic in two months  The patient demonstrates the following  risk factors for suicide: Chronic risk factors for suicide include psychiatric disorder, previous suicide attempt, medical history of cancer. Acute risk factors for suicide include death of her dog.  Protective factors for this patient include positive social support, responsibility to others (children, family), coping skills, hope for the future.  Considering these factors, the overall suicide risk at this point appears to be low.   Norman Clay, MD 10/13/2015

## 2015-10-13 NOTE — Patient Instructions (Addendum)
1. Increase Lexapro 20 mg daily 2. Will not make refill for Adderall this time 3. You have one refill available for Xanax 4. Continue other medication 5. Return to clinic in two months

## 2015-10-14 ENCOUNTER — Ambulatory Visit (INDEPENDENT_AMBULATORY_CARE_PROVIDER_SITE_OTHER): Payer: Medicare Other | Admitting: Psychology

## 2015-10-14 DIAGNOSIS — F411 Generalized anxiety disorder: Secondary | ICD-10-CM

## 2015-10-14 DIAGNOSIS — F251 Schizoaffective disorder, depressive type: Secondary | ICD-10-CM

## 2015-10-16 ENCOUNTER — Encounter: Payer: Self-pay | Admitting: Gastroenterology

## 2015-10-17 ENCOUNTER — Encounter (HOSPITAL_COMMUNITY): Payer: Self-pay | Admitting: Psychology

## 2015-10-17 NOTE — Progress Notes (Signed)
Patient:  Ruth Duncan   DOB: 1970/05/18  MR Number: [id number]  Location: [facility] [address] [address] Dept: [phone]  Start: 4 PM End: 5 PM  Provider/Observer:     Edgardo Roys PSYD  Chief Complaint:      Chief Complaint  Patient presents with  . Anxiety  . Depression  . Agitation  . Stress    Reason For Service:     The patient is a 45 year old female who has been followed by the outpatient facility in [place] since April 2013. She has been diagnosed with schizoaffective disorder as well as previous diagnoses of attention deficit disorder. I reviewed the patient's history and it is consistent with a mood disorder along with hallucinations primarily auditory. The patient describes episodes of significant manic events and agitation as well as severe depressive events. She reports that she has developed suicidal ideation during significant depressive episodes. 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 The patient also has a history of alcohol abuse as well but she has been free from alcohol abuse for quite some time. The patient also quit smoking. The patient reports that she has had these symptoms since she was about 12 or 13 years as far she can remember. She has been treated in the past by other psychiatrists. She has had poor response to some medications and good response to other medications. However, she is trying to cope with guilty feelings about one of her children having autism thinking that it is her fault and feeling bad about her condition and how it affects her husband in the way his family use her.   Interventions Strategy:  Cognitive/behavioral psychotherapeutic interventions  Participation Level:   Active  Participation  Quality:  Appropriate      Behavioral Observation:  Well Groomed, Alert, and Appropriate and Tearful.   Current Psychosocial Factors: The patient reports that she is doing much better. She has gone away with her friends on a vacation trip and has been coping with her husband infidelity. Always difficult to "love herself" that she is doing much better with taking care of herself especially now that her boys are often college or on the wrong..  Content of Session:   Reviewed current symptoms and work on therapeutic interventions about building better coping skills and strategies to deal with her underlying mood disorder. We looked at some foundational issues such as her sleep pattern, dietary pattern, and physical activity patterns.  Current Status:   The patient reports that she has not had any full-blown manic or depressive events recently  But has been dealing with major stress related to infidelity from her husband.  She reports that she is talking with friends and trying to figure out what to do next.  Target Goals:   Target goals include reducing intensity, severity, and duration of her mood swings related to both manic episodes as well as depressive episodes.  Last Reviewed:   10/15/2015  Goals Addressed Today:    Goals addressed today had to do with building better coping skills around managing her moods.  Impression/Diagnosis:   The patient describes a history of mood disorder that included auditory hallucinations dating back to age. She reports that these symptoms have been treated to varying degrees to the years. She reports that she has had difficulty  with substance abuse related to alcohol as a means to self medicate herself and has also gotten herself in trouble during manic episodes. She also has events where she developed suicidal ideation during times of significant depression.  Diagnosis:    Axis I: Schizoaffective disorder, depressive type ([place])  Generalized anxiety  disorder   Keshawn Fiorito R, PsyD 10/17/2015

## 2015-11-06 ENCOUNTER — Other Ambulatory Visit: Payer: Self-pay | Admitting: Family Medicine

## 2015-11-06 DIAGNOSIS — Z1231 Encounter for screening mammogram for malignant neoplasm of breast: Secondary | ICD-10-CM

## 2015-11-19 ENCOUNTER — Ambulatory Visit: Payer: Self-pay | Admitting: Gastroenterology

## 2015-11-24 ENCOUNTER — Ambulatory Visit (HOSPITAL_COMMUNITY): Payer: Self-pay | Admitting: Psychology

## 2015-11-30 ENCOUNTER — Other Ambulatory Visit (HOSPITAL_COMMUNITY): Payer: Self-pay | Admitting: Psychiatry

## 2015-11-30 DIAGNOSIS — F251 Schizoaffective disorder, depressive type: Secondary | ICD-10-CM

## 2015-12-02 ENCOUNTER — Telehealth (HOSPITAL_COMMUNITY): Payer: Self-pay

## 2015-12-02 DIAGNOSIS — F251 Schizoaffective disorder, depressive type: Secondary | ICD-10-CM

## 2015-12-02 MED ORDER — LAMOTRIGINE 150 MG PO TABS: 150.0000 mg | ORAL_TABLET | Freq: Every day | ORAL | 0 refills | Status: DC

## 2015-12-02 MED ORDER — ALPRAZOLAM 0.5 MG PO TABS: 0.5000 mg | ORAL_TABLET | Freq: Every day | ORAL | 1 refills | Status: DC

## 2015-12-02 MED ORDER — HYDROXYZINE PAMOATE 25 MG PO CAPS: 25.0000 mg | ORAL_CAPSULE | Freq: Every day | ORAL | 1 refills | Status: DC

## 2015-12-02 MED ORDER — ESCITALOPRAM OXALATE 20 MG PO TABS: 20.0000 mg | ORAL_TABLET | Freq: Every day | ORAL | 1 refills | Status: DC

## 2015-12-02 MED ORDER — HALOPERIDOL 2 MG PO TABS: ORAL_TABLET | ORAL | 0 refills | Status: DC

## 2015-12-02 MED ORDER — BENZTROPINE MESYLATE 0.5 MG PO TABS: ORAL_TABLET | ORAL | 0 refills | Status: DC

## 2015-12-02 NOTE — Telephone Encounter (Signed)
Medication refill request - Telephone call with patient who is requesting a new Xanax order and all medication refills be sent into her pharmacy today. States she has been rescheduled several times and now will see Dr. Adele Schilder 01/20/16.  Agreed to send requests to Dr. Adele Schilder and to call patient back once orders completed.

## 2015-12-02 NOTE — Telephone Encounter (Signed)
Patient requested a new Adderall prescription be prepared for her to pick up and agreed to call back once approved.

## 2015-12-02 NOTE — Telephone Encounter (Signed)
Okay to refill her prescription until she see physician.

## 2015-12-02 NOTE — Telephone Encounter (Signed)
Medication management - Telephone call wtih patient to verify she needed all new orders for Xanax, Haldol, Cogentin, Vistaril, Lamictal, and Lexapro.  Agreed to send these into her [facility].  New orders for Vistaril, Lamictal, Lexapro, Cogentin and Haldol all e-scribed to patient's [facility] and called in Xanax order plus one refill with Deatra Canter, pharmacist, all authorized by Dr. Adele Schilder to las patient until she returns for evaluation now set for 01/20/16.  Patient to call back if any problems prior to appointment.

## 2015-12-03 ENCOUNTER — Telehealth (HOSPITAL_COMMUNITY): Payer: Self-pay

## 2015-12-03 ENCOUNTER — Other Ambulatory Visit (HOSPITAL_COMMUNITY): Payer: Self-pay | Admitting: Psychiatry

## 2015-12-03 DIAGNOSIS — F900 Attention-deficit hyperactivity disorder, predominantly inattentive type: Secondary | ICD-10-CM

## 2015-12-03 MED ORDER — AMPHETAMINE-DEXTROAMPHETAMINE 10 MG PO TABS: 10.0000 mg | ORAL_TABLET | Freq: Every day | ORAL | 0 refills | Status: DC

## 2015-12-03 NOTE — Telephone Encounter (Signed)
Medication management - Telephone call with patient to inform her Adderrall prescription was prepared for her to pick up.

## 2015-12-08 ENCOUNTER — Telehealth (HOSPITAL_COMMUNITY): Payer: Self-pay | Admitting: Psychiatry

## 2015-12-08 ENCOUNTER — Ambulatory Visit: Payer: Self-pay

## 2015-12-08 NOTE — Telephone Encounter (Signed)
Ruth Duncan picked up prescription on [id number] dlo

## 2015-12-16 ENCOUNTER — Encounter: Payer: Self-pay | Admitting: Gastroenterology

## 2015-12-17 ENCOUNTER — Ambulatory Visit: Payer: Self-pay | Admitting: Gastroenterology

## 2015-12-18 ENCOUNTER — Ambulatory Visit (HOSPITAL_COMMUNITY): Payer: Self-pay | Admitting: Psychiatry

## 2015-12-19 ENCOUNTER — Ambulatory Visit (HOSPITAL_COMMUNITY): Payer: Self-pay | Admitting: Psychology

## 2015-12-22 ENCOUNTER — Ambulatory Visit (HOSPITAL_COMMUNITY): Payer: Self-pay | Admitting: Psychology

## 2015-12-30 ENCOUNTER — Ambulatory Visit (HOSPITAL_COMMUNITY): Payer: Self-pay | Admitting: Psychology

## 2016-01-01 ENCOUNTER — Ambulatory Visit (HOSPITAL_COMMUNITY): Payer: Self-pay | Admitting: Psychology

## 2016-01-12 ENCOUNTER — Ambulatory Visit
Admission: RE | Admit: 2016-01-12 | Discharge: 2016-01-12 | Disposition: A | Discharge status: Home / Self Care | Payer: Medicare Other | Source: Ambulatory Visit | Attending: Family Medicine | Admitting: Family Medicine

## 2016-01-12 DIAGNOSIS — Z1231 Encounter for screening mammogram for malignant neoplasm of breast: Secondary | ICD-10-CM | POA: Diagnosis not present

## 2016-01-20 ENCOUNTER — Encounter (HOSPITAL_COMMUNITY): Payer: Self-pay | Admitting: Psychiatry

## 2016-01-20 ENCOUNTER — Ambulatory Visit (INDEPENDENT_AMBULATORY_CARE_PROVIDER_SITE_OTHER): Payer: Medicare Other | Admitting: Psychiatry

## 2016-01-20 VITALS — BP 98/66 | HR 70 | Ht 63.0 in | Wt 127.8 lb

## 2016-01-20 DIAGNOSIS — Z800 Family history of malignant neoplasm of digestive organs: Secondary | ICD-10-CM

## 2016-01-20 DIAGNOSIS — F251 Schizoaffective disorder, depressive type: Secondary | ICD-10-CM

## 2016-01-20 DIAGNOSIS — Z9889 Other specified postprocedural states: Secondary | ICD-10-CM

## 2016-01-20 DIAGNOSIS — Z79899 Other long term (current) drug therapy: Secondary | ICD-10-CM

## 2016-01-20 DIAGNOSIS — Z87891 Personal history of nicotine dependence: Secondary | ICD-10-CM

## 2016-01-20 DIAGNOSIS — Z841 Family history of disorders of kidney and ureter: Secondary | ICD-10-CM

## 2016-01-20 DIAGNOSIS — Z811 Family history of alcohol abuse and dependence: Secondary | ICD-10-CM | POA: Diagnosis not present

## 2016-01-20 DIAGNOSIS — Z8249 Family history of ischemic heart disease and other diseases of the circulatory system: Secondary | ICD-10-CM

## 2016-01-20 MED ORDER — HALOPERIDOL 2 MG PO TABS: ORAL_TABLET | ORAL | 0 refills | Status: DC

## 2016-01-20 MED ORDER — METHYLPHENIDATE HCL 10 MG PO TABS: 10.0000 mg | ORAL_TABLET | Freq: Every day | ORAL | 0 refills | Status: DC

## 2016-01-20 MED ORDER — TRAZODONE HCL 50 MG PO TABS: ORAL_TABLET | ORAL | 0 refills | Status: DC

## 2016-01-20 MED ORDER — LAMOTRIGINE 150 MG PO TABS: 150.0000 mg | ORAL_TABLET | Freq: Every day | ORAL | 0 refills | Status: DC

## 2016-01-20 MED ORDER — ESCITALOPRAM OXALATE 20 MG PO TABS: 20.0000 mg | ORAL_TABLET | Freq: Every day | ORAL | 0 refills | Status: DC

## 2016-01-20 MED ORDER — ALPRAZOLAM 0.5 MG PO TABS: 0.5000 mg | ORAL_TABLET | Freq: Every day | ORAL | 0 refills | Status: DC

## 2016-01-20 MED ORDER — BENZTROPINE MESYLATE 0.5 MG PO TABS: ORAL_TABLET | ORAL | 0 refills | Status: DC
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
## 2016-01-28 ENCOUNTER — Other Ambulatory Visit (HOSPITAL_COMMUNITY): Payer: Self-pay | Admitting: Psychiatry

## 2016-01-28 ENCOUNTER — Other Ambulatory Visit: Payer: Self-pay

## 2016-01-28 ENCOUNTER — Ambulatory Visit (INDEPENDENT_AMBULATORY_CARE_PROVIDER_SITE_OTHER): Payer: Medicare Other | Admitting: Gastroenterology

## 2016-01-28 ENCOUNTER — Encounter: Payer: Self-pay | Admitting: Gastroenterology

## 2016-01-28 DIAGNOSIS — F251 Schizoaffective disorder, depressive type: Secondary | ICD-10-CM

## 2016-01-28 DIAGNOSIS — M546 Pain in thoracic spine: Secondary | ICD-10-CM

## 2016-01-28 DIAGNOSIS — R197 Diarrhea, unspecified: Secondary | ICD-10-CM | POA: Diagnosis not present

## 2016-01-28 DIAGNOSIS — K219 Gastro-esophageal reflux disease without esophagitis: Secondary | ICD-10-CM | POA: Diagnosis not present

## 2016-01-28 DIAGNOSIS — M545 Low back pain, unspecified: Secondary | ICD-10-CM | POA: Insufficient documentation

## 2016-01-28 DIAGNOSIS — M549 Dorsalgia, unspecified: Secondary | ICD-10-CM

## 2016-01-28 MED ORDER — PANTOPRAZOLE SODIUM 40 MG PO TBEC: DELAYED_RELEASE_TABLET | ORAL | 11 refills | Status: DC

## 2016-01-28 MED ORDER — LIDOCAINE VISCOUS 2 % MT SOLN: OROMUCOSAL | 1 refills | Status: DC

## 2016-01-28 NOTE — Assessment & Plan Note (Signed)
SYMPTOMS NOT IDEALLY CONTROLLED AND MAY BE DUE TO CHANGE IN DIET. NOW ON PROTONIX QD AND TUMS PRN.   DRINK WATER TO KEEP YOUR URINE LIGHT YELLOW. AVOID DIET DRINKS. AVOID REFLUX TRIGGERS.  HANDOUT GIVEN AND DISCUSSED. INCREASE PROTONIX. TAKE 30 MINUTES PRIOR TO MEALS TWICE DAILY. USE VISCOUS LIDOCAINE 2 TSP Q4H IF NEEDED FOR CHEST, OR UPPER ABDOMINAL PAIN OR HEARTBURN. USE NO MORE THAN 8 DOSES A DAY. IT WILL MAKEYOUR MOUTH, ESOPHAGUS, AND STOMACH NUMB. FOLLOW UP IN 3 MOS.

## 2016-01-28 NOTE — Progress Notes (Signed)
ON RECALL  °

## 2016-01-28 NOTE — Assessment & Plan Note (Signed)
SYMPTOMS NOT CONTROLLED. HAVE RESPONDED TO CHIROPRACTOR IN PAST.  COMPLETE SPINE AND CHEST XRAY.  SEE PHYSICAL THERAPY FOR BACK PAIN.  FOLLOW UP IN 3 MOS.

## 2016-01-28 NOTE — Assessment & Plan Note (Signed)
SYMPTOMS FAIRLY WELL CONTROLLED.  CONTINUE TO MONITOR SYMPTOMS. 
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
## 2016-01-28 NOTE — Patient Instructions (Addendum)
DRINK WATER TO KEEP YOUR URINE LIGHT YELLOW. AVOID DIET DRINKS.  AVOID REFLUX TRIGGERS. SEE INFO BELOW.  INCREASE PROTONIX. TAKE 30 MINUTES PRIOR TO MEALS TWICE DAILY.  USE VISCOUS LIDOCAINE 2 TSP Q4H IF NEEDED FOR CHEST, OR UPPER ABDOMINAL PAIN OR HEARTBURN. USE NO MORE THAN 8 DOSES A DAY. IT WILL MAKEYOUR MOUTH, ESOPHAGUS, AND STOMACH NUMB.  COMPLETE SPINE XRAY.  SEE PHYSICAL THERAPY FOR BACK PAIN.  FOLLOW UP IN 3 MOS.    Lifestyle and home remedies TO HELP CONTROL HEARTBURN.  You may eliminate or reduce the frequency of heartburn by making the following lifestyle changes:  . Control your weight. Being overweight is a major risk factor for heartburn and GERD. Excess pounds put pressure on your abdomen, pushing up your stomach and causing acid to back up into your esophagus.   . Eat smaller meals. 4 TO 6 MEALS A DAY. This reduces pressure on the lower esophageal sphincter, helping to prevent the valve from opening and acid from washing back into your esophagus.   Dolphus Jenny your belt. Clothes that fit tightly around your waist put pressure on your abdomen and the lower esophageal sphincter.  .  . Eliminate heartburn triggers. Everyone has specific triggers. Common triggers such as fatty or fried foods, spicy food, tomato sauce, carbonated beverages, alcohol, chocolate, mint, garlic, onion, caffeine and nicotine may make heartburn worse.   [name] Avoid stooping or bending AFTER EATING. Tying your shoes is OK. Bending over for longer periods to weed your garden isn't, especially soon after eating.   . Don't lie down after a meal. Wait at least three to four hours after eating before going to bed, and don't lie down right after eating.   [name] PLACE THE HEAD OF YOUR BED ON 6 INCH BLOCKS.  Alternative medicine . Several home remedies exist for treating GERD, but they provide only temporary relief. They include drinking baking soda (sodium bicarbonate) added to water or drinking other fluids such  as baking soda mixed with cream of tartar and water. . Although these liquids create temporary relief by neutralizing, washing away or buffering acids, eventually they aggravate the situation by adding gas and fluid to your stomach, increasing pressure and causing more acid reflux. Further, adding more sodium to your diet may increase your blood pressure and add stress to your heart, and excessive bicarbonate ingestion can alter the acid-base balance in your body.

## 2016-01-29 ENCOUNTER — Other Ambulatory Visit (HOSPITAL_COMMUNITY): Payer: Self-pay | Admitting: Psychiatry

## 2016-01-29 NOTE — Progress Notes (Signed)
cc'd to pcp 

## 2016-02-02 ENCOUNTER — Ambulatory Visit (HOSPITAL_COMMUNITY)
Admission: RE | Admit: 2016-02-02 | Discharge: 2016-02-02 | Disposition: A | Discharge status: Home / Self Care | Payer: Medicare Other | Source: Ambulatory Visit | Attending: Gastroenterology | Admitting: Gastroenterology

## 2016-02-02 ENCOUNTER — Other Ambulatory Visit: Payer: Self-pay

## 2016-02-02 ENCOUNTER — Encounter (HOSPITAL_COMMUNITY): Payer: Self-pay

## 2016-02-02 DIAGNOSIS — M546 Pain in thoracic spine: Secondary | ICD-10-CM

## 2016-02-02 DIAGNOSIS — M545 Low back pain, unspecified: Secondary | ICD-10-CM

## 2016-02-02 DIAGNOSIS — M50321 Other cervical disc degeneration at C4-C5 level: Secondary | ICD-10-CM | POA: Diagnosis not present

## 2016-02-02 DIAGNOSIS — M4186 Other forms of scoliosis, lumbar region: Secondary | ICD-10-CM | POA: Insufficient documentation

## 2016-02-02 DIAGNOSIS — M4184 Other forms of scoliosis, thoracic region: Secondary | ICD-10-CM | POA: Diagnosis not present

## 2016-02-02 DIAGNOSIS — S199XXA Unspecified injury of neck, initial encounter: Secondary | ICD-10-CM | POA: Diagnosis not present

## 2016-02-02 DIAGNOSIS — J449 Chronic obstructive pulmonary disease, unspecified: Secondary | ICD-10-CM | POA: Diagnosis not present

## 2016-02-03 ENCOUNTER — Telehealth: Payer: Self-pay | Admitting: Gastroenterology

## 2016-02-03 NOTE — Telephone Encounter (Signed)
PT is aware.

## 2016-02-03 NOTE — Telephone Encounter (Addendum)
PLEASE CALL PT. HER XRAY SHOW MILD SCOLIOSIS AND DEGENERATIVE DISC DISEASE ON HER NECK. NO REASON FOR HER RIGHT SIDE PAIN WAS IDENTIFIED. SHE SHOULD PROCEED WITH PHYSICAL THERAPY.

## 2016-02-04 ENCOUNTER — Ambulatory Visit: Payer: Self-pay | Admitting: Physical Therapy

## 2016-02-18 ENCOUNTER — Other Ambulatory Visit (HOSPITAL_COMMUNITY): Payer: Self-pay | Admitting: Psychiatry

## 2016-02-18 DIAGNOSIS — F251 Schizoaffective disorder, depressive type: Secondary | ICD-10-CM

## 2016-02-18 MED ORDER — TRAZODONE HCL 50 MG PO TABS: ORAL_TABLET | ORAL | 0 refills | Status: DC

## 2016-02-23 DIAGNOSIS — H40033 Anatomical narrow angle, bilateral: Secondary | ICD-10-CM | POA: Diagnosis not present

## 2016-02-23 DIAGNOSIS — H04123 Dry eye syndrome of bilateral lacrimal glands: Secondary | ICD-10-CM | POA: Diagnosis not present

## 2016-03-01 ENCOUNTER — Ambulatory Visit (HOSPITAL_COMMUNITY): Payer: Self-pay | Admitting: Psychiatry

## 2016-03-03 ENCOUNTER — Other Ambulatory Visit (HOSPITAL_COMMUNITY): Payer: Self-pay | Admitting: Psychiatry

## 2016-03-03 DIAGNOSIS — F251 Schizoaffective disorder, depressive type: Secondary | ICD-10-CM

## 2016-03-04 ENCOUNTER — Encounter: Payer: Self-pay | Admitting: Gastroenterology

## 2016-03-08 ENCOUNTER — Ambulatory Visit (INDEPENDENT_AMBULATORY_CARE_PROVIDER_SITE_OTHER): Payer: Medicare Other | Admitting: Psychiatry

## 2016-03-08 ENCOUNTER — Encounter (HOSPITAL_COMMUNITY): Payer: Self-pay | Admitting: Psychiatry

## 2016-03-08 VITALS — BP 118/68 | HR 66 | Ht 63.0 in | Wt 127.0 lb

## 2016-03-08 DIAGNOSIS — Z811 Family history of alcohol abuse and dependence: Secondary | ICD-10-CM

## 2016-03-08 DIAGNOSIS — Z87891 Personal history of nicotine dependence: Secondary | ICD-10-CM | POA: Diagnosis not present

## 2016-03-08 DIAGNOSIS — F250 Schizoaffective disorder, bipolar type: Secondary | ICD-10-CM | POA: Diagnosis not present

## 2016-03-08 DIAGNOSIS — F902 Attention-deficit hyperactivity disorder, combined type: Secondary | ICD-10-CM | POA: Diagnosis not present

## 2016-03-08 DIAGNOSIS — Z79899 Other long term (current) drug therapy: Secondary | ICD-10-CM

## 2016-03-08 DIAGNOSIS — F411 Generalized anxiety disorder: Secondary | ICD-10-CM | POA: Diagnosis not present

## 2016-03-08 DIAGNOSIS — H04123 Dry eye syndrome of bilateral lacrimal glands: Secondary | ICD-10-CM | POA: Diagnosis not present

## 2016-03-08 MED ORDER — BENZTROPINE MESYLATE 0.5 MG PO TABS: 0.5000 mg | ORAL_TABLET | Freq: Every day | ORAL | 1 refills | Status: DC

## 2016-03-08 MED ORDER — METHYLPHENIDATE HCL 10 MG PO TABS: 10.0000 mg | ORAL_TABLET | Freq: Every day | ORAL | 0 refills | Status: DC

## 2016-03-08 MED ORDER — ALPRAZOLAM 0.5 MG PO TABS: 0.5000 mg | ORAL_TABLET | Freq: Every day | ORAL | 0 refills | Status: DC

## 2016-03-08 MED ORDER — HALOPERIDOL 2 MG PO TABS: ORAL_TABLET | ORAL | 0 refills | Status: DC

## 2016-03-08 MED ORDER — LITHIUM CARBONATE 300 MG PO TABS: 300.0000 mg | ORAL_TABLET | Freq: Every day | ORAL | 1 refills | Status: DC

## 2016-03-08 MED ORDER — LITHIUM CARBONATE 300 MG PO TABS: 300.0000 mg | ORAL_TABLET | Freq: Two times a day (BID) | ORAL | 1 refills | Status: DC

## 2016-03-08 MED ORDER — ESCITALOPRAM OXALATE 20 MG PO TABS: 20.0000 mg | ORAL_TABLET | Freq: Every day | ORAL | 0 refills | Status: DC

## 2016-03-08 NOTE — Progress Notes (Signed)
[place] MD/PA/NP OP Progress Note  03/08/2016 1:57 PM Ruth Duncan  MRN:  [id number]  Chief Complaint:  Subjective:  I am under a lot of stress.  My Ruth Duncan is to in July but I'm not sure if I able to see her.  HPI: Ruth Duncan came for her follow-up appointment.  On her last appointment we increased Haldol and try on trazodone.  Ruth Duncan sleeping better but Ruth Duncan still have a lot of mood swing irritability anger and frustration.  Her biggest stressor is dealing with living situation.  Since her 82 year old son broke up with his girlfriend Ruth Duncan has noticed increased anxiety and mood swing.  Patient told his son's girlfriend is pregnant and due in July but Ruth Duncan's been sending her nasty messages and Ruth Duncan is not sure if Ruth Duncan is able to see a grandchild.  Ruth Duncan admitted poor sleep, irritability, hallucination and frustration.  Though Ruth Duncan denies any suicidal thoughts or homicidal thought but endorsed severe mood swings.  Ruth Duncan seen some improvement with increased Haldol but now like to go back on lithium which Ruth Duncan took in the past with good response.  Ruth Duncan does not want any other medication that cause weight gain.  Ruth Duncan remember lithium did not cause weight gain.  Ruth Duncan sleeping better with trazodone.  Her husband is very supportive.  Ruth Duncan lives with her husband and 2 children.  Patient denies drinking or using any illegal substances.  Recently Ruth Duncan's seen her primary care physician but there were no blood work done.  Her appetite is okay.  Her vital signs are stable.  Visit Diagnosis:    ICD-9-CM ICD-10-CM   1. Schizoaffective disorder, bipolar type (Ruth Duncan) 295.70 F25.0 escitalopram (LEXAPRO) 20 MG tablet     ALPRAZolam (XANAX) 0.5 MG tablet     haloperidol (HALDOL) 2 MG tablet     benztropine (COGENTIN) 0.5 MG tablet     methylphenidate (RITALIN) 10 MG tablet     lithium 300 MG tablet     DISCONTINUED: lithium 300 MG tablet     DISCONTINUED: methylphenidate (RITALIN) 10 MG tablet    Past Psychiatric History: Reviewed. Patient has  history of taking overdose in the past requires inpatient treatment.  Ruth Duncan had history of using drugs.  Ruth Duncan has history of paranoia and hallucination.  Ruth Duncan was also diagnosed with ADD.  In the past Ruth Duncan had tried Abilify, lithium, Vyvanse, Seroquel, fanpat.  Ruth Duncan used to see Dr. Robina Ade.  In the past Ruth Duncan had done CD IOP.  Past Medical History:  Past Medical History:  Diagnosis Date  . Bipolar disorder ([place])   . Chronic abdominal pain    hx of IBS-D, ? bile salt-induced diarrhea  . Chronic eczema of hand 2012   BILATERAL ON MTX SINCE 2014  . Complication of anesthesia   . Diverticulitis   . Ovarian cancer ([place]) 2010  . Surgical menopause 2010  . Tubular adenoma 2010    Past Surgical History:  Procedure Laterality Date  . ABDOMINAL HYSTERECTOMY  2010  . APPENDECTOMY  2004  . BACTERIAL OVERGROWTH TEST N/A 09/01/2015   Procedure: BACTERIAL OVERGROWTH TEST;  Surgeon: Danie Binder, MD;  [facility];  Service: Endoscopy;  Laterality: N/A;  700  . CESAREAN SECTION  1998  . COLONOSCOPY  12/2008   tubular adenomas, negative microscopic colitis. Due for Flex Sig Dec 2015  . ESOPHAGOGASTRODUODENOSCOPY  05/06/2008   [id number] esophagus without evidence of Barrett's, mass, erosion, ulceration or stricture/ Hiatal hernia/ Normal duodenal bulb and second portion of  the duodenum  . ESOPHAGOGASTRODUODENOSCOPY N/A 10/04/2014   Dr. Oneida Alar: patent stricture at GE junction, no dilation, small hiatal hernia, mild non-erosive gastritis (benign), multiple small gastric polyps, benign. Capsule study recommended but never completed   . FLEXIBLE SIGMOIDOSCOPY N/A 02/18/2014   SLF: Normal small bowel. formed stool at anastomosis. Stool cleared with irrigation. One superfical erosion at the anastomosis. otherwise normal. normal rectal mucosa  . SUBTOTAL COLECTOMY  2011  . TUBAL LIGATION      Family Psychiatric History: Reviewed.  Family History:  Family History  Problem Relation Age of Onset  .  Alcohol abuse Paternal Grandfather   . Colon cancer Neg Hx   . Hypertension Mother   . Kidney disease Father   . Hypertension Father   . Coronary artery disease Father     Social History:  Social History   Social History  . Marital status: Married    Spouse name: N/A  . Number of children: N/A  . Years of education: N/A   Occupational History  . Disability     since 2010   Social History Main Topics  . Smoking status: Former Smoker    Packs/day: 1.00    Years: 30.00    Quit date: 11/04/2012  . Smokeless tobacco: Never Used  . Alcohol use No  . Drug use: No  . Sexual activity: Yes    Partners: Male   Other Topics Concern  . Not on file   Social History Narrative   Ruth Duncan was born and grew up in [place], [place]. Her parents divorced when Ruth Duncan was 10 years of age, and Ruth Duncan grew up with her paternal grandmother and grandfather. Her grandfather was an alcoholic, and Ruth Duncan found him dead of an MI at the age of 67. Her mother used to lock her in a dark bathroom for an hour or so very frequently, and Ruth Duncan continues to have phobias associated with opening a bathroom door. Ruth Duncan graduated from [employer], but has had no real career because Ruth Duncan was unable to hold a job. Ruth Duncan has been married for 29 years and has 3 boys, twins age 104 and her oldest son age 40. 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 Ruth Duncan is currently attending Alcoholics Anonymous meetings 3 times a week, has a sponsor, and is working the 12 steps.    Allergies: No Known Allergies  Metabolic Disorder Labs: Lab Results  Component Value Date   HGBA1C 5.7 (H) 04/17/2013   MPG 117 (H) 04/17/2013   No results found for: PROLACTIN No results found for: CHOL, TRIG, HDL, CHOLHDL, VLDL, LDLCALC   Current Medications: Current Outpatient Prescriptions  Medication Sig Dispense Refill  . ALPRAZolam (XANAX) 0.5 MG tablet Take 1 tablet (0.5 mg total) by mouth at bedtime. 30 tablet  0  . benztropine (COGENTIN) 0.5 MG tablet Take 1 tablet (0.5 mg total) by mouth at bedtime. 30 tablet 1  . escitalopram (LEXAPRO) 20 MG tablet Take 1 tablet (20 mg total) by mouth daily. 90 tablet 0  . haloperidol (HALDOL) 2 MG tablet Take 1 tab in am and 4 at bed time 450 tablet 0  . lamoTRIgine (LAMICTAL) 150 MG tablet Take 1 tablet (150 mg total) by mouth daily. 90 tablet 0  . lidocaine (XYLOCAINE) 2 % solution 2 TSP  PO 30 MINS PRIOR TO MEALS OR AT BEDTIME PRN FOR CHEST PAIN. REPEAT DOSE EVERY 4 HOURS. NO MORE THAN 8 DOSES A DAY. [place]  mL 1  . methylphenidate (RITALIN) 10 MG tablet Take 1 tablet (10 mg total) by mouth daily. 30 tablet 0  . nitrofurantoin (MACRODANTIN) 50 MG capsule Take 50 mg by mouth at bedtime.   5  . ondansetron (ZOFRAN) 4 MG tablet Take 1 tablet (4 mg total) by mouth every 8 (eight) hours as needed for nausea or vomiting. 90 tablet 3  . pantoprazole (PROTONIX) 40 MG tablet 1 PO 30 MINS BEFORE BREAKFAST AND SUPPER 60 tablet 11  . promethazine (PHENERGAN) 25 MG tablet Take 1 tablet (25 mg total) by mouth every 6 (six) hours as needed for nausea or vomiting. 40 tablet 1  . traZODone (DESYREL) 50 MG tablet Take 1/2 to 1 tab at bed time 30 tablet 0  . valACYclovir (VALTREX) 500 MG tablet Take 500 mg by mouth once a week.      No current facility-administered medications for this visit.     Neurologic: Headache: No Seizure: No Paresthesias: No  Musculoskeletal: Strength & Muscle Tone: within normal limits Gait & Station: normal Patient leans: N/A  Psychiatric Specialty Exam: Review of Systems  Constitutional: Negative.   HENT: Negative.   Cardiovascular: Negative for chest pain.  Musculoskeletal: Negative.   Skin: Negative for itching and rash.  Neurological: Negative.   Psychiatric/Behavioral: The patient is nervous/anxious.     Blood pressure 118/68, pulse 66, height 5\' 3"  (1.6 m), weight 127 lb (57.6 kg).There is no height or weight on file to calculate BMI.   General Appearance: Casual  Eye Contact:  Fair  Speech:  Clear and Coherent and Fast  Volume:  Normal  Mood:  Anxious  Affect:  Labile  Thought Process:  Goal Directed  Orientation:  Full (Time, Place, and Person)  Thought Content: Hallucinations: Visual   Suicidal Thoughts:  No  Homicidal Thoughts:  No  Memory:  Immediate;   Fair Recent;   Fair Remote;   Fair  Judgement:  Good  Insight:  Good  Psychomotor Activity:  Mild increase  Concentration:  Concentration: Fair and Attention Span: Fair  Recall:  [employer] of Knowledge: Good  Language: Good  Akathisia:  No  Handed:  Right  AIMS (if indicated):  0  Assets:  Communication Skills Desire for Improvement Housing Physical Health Resilience Social Support  ADL's:  Intact  Cognition: WNL  Sleep:  Fair     Assessment: Schizoaffective disorder, bipolar type.  Generalized anxiety disorder.  ADD combined type  Plan: I review her psychosocial stressors, current medication and records from other provider.  I would discontinue Lamictal as patient like to go back on lithium which Ruth Duncan had taken in the past with good response.  We will start lithium 300 mg twice a day.  We will do blood work on her next appointment.  I offer counseling but patient declined.  Continue trazodone 50 mg and I recommended if Ruth Duncan has insomnia Ruth Duncan can try 100 mg if needed.  Continue Xanax 0.5 mg as needed for severe anxiety and Haldol 2 mg in the morning and 8 mg at bedtime.  Ruth Duncan is not interested in any other antipsychotic due to weight gain.  Continue Ritalin 10 mg daily.  Patient has no tremors, shakes, EPS, chest pain or any other side effects.  Patient realizes if lithium did not help her then Ruth Duncan will go back on Lamictal.  Discuss safety plan that anytime having active suicidal thoughts or homicidal thoughts then Ruth Duncan need to call 911 or go to the local emergency  room.  Follow-up in 2 months.  Elga Santy T., MD 03/08/2016, 1:57 PM

## 2016-03-19 ENCOUNTER — Other Ambulatory Visit (HOSPITAL_COMMUNITY): Payer: Self-pay | Admitting: Psychiatry

## 2016-03-19 DIAGNOSIS — F251 Schizoaffective disorder, depressive type: Secondary | ICD-10-CM

## 2016-03-21 ENCOUNTER — Other Ambulatory Visit (HOSPITAL_COMMUNITY): Payer: Self-pay | Admitting: Psychiatry

## 2016-03-21 DIAGNOSIS — F251 Schizoaffective disorder, depressive type: Secondary | ICD-10-CM

## 2016-03-22 ENCOUNTER — Other Ambulatory Visit (HOSPITAL_COMMUNITY): Payer: Self-pay | Admitting: Psychiatry

## 2016-04-06 ENCOUNTER — Encounter: Payer: Self-pay | Admitting: Gastroenterology

## 2016-04-19 ENCOUNTER — Encounter (HOSPITAL_COMMUNITY): Payer: Self-pay | Admitting: Psychiatry

## 2016-04-19 ENCOUNTER — Ambulatory Visit (INDEPENDENT_AMBULATORY_CARE_PROVIDER_SITE_OTHER): Payer: Medicare Other | Admitting: Psychiatry

## 2016-04-19 VITALS — BP 128/74 | HR 68 | Ht 63.0 in | Wt 123.8 lb

## 2016-04-19 DIAGNOSIS — F419 Anxiety disorder, unspecified: Secondary | ICD-10-CM

## 2016-04-19 DIAGNOSIS — Z79899 Other long term (current) drug therapy: Secondary | ICD-10-CM

## 2016-04-19 DIAGNOSIS — F250 Schizoaffective disorder, bipolar type: Secondary | ICD-10-CM

## 2016-04-19 DIAGNOSIS — F909 Attention-deficit hyperactivity disorder, unspecified type: Secondary | ICD-10-CM

## 2016-04-19 DIAGNOSIS — Z811 Family history of alcohol abuse and dependence: Secondary | ICD-10-CM

## 2016-04-19 DIAGNOSIS — F251 Schizoaffective disorder, depressive type: Secondary | ICD-10-CM

## 2016-04-19 DIAGNOSIS — Z87891 Personal history of nicotine dependence: Secondary | ICD-10-CM | POA: Diagnosis not present

## 2016-04-19 MED ORDER — ALPRAZOLAM 0.5 MG PO TABS: 0.5000 mg | ORAL_TABLET | Freq: Every day | ORAL | 0 refills | Status: DC

## 2016-04-19 MED ORDER — TRAZODONE HCL 100 MG PO TABS: ORAL_TABLET | ORAL | 0 refills | Status: DC

## 2016-04-19 MED ORDER — LITHIUM CARBONATE 300 MG PO TABS: 300.0000 mg | ORAL_TABLET | Freq: Three times a day (TID) | ORAL | 1 refills | Status: DC

## 2016-04-19 MED ORDER — METHYLPHENIDATE HCL 10 MG PO TABS: 10.0000 mg | ORAL_TABLET | Freq: Every day | ORAL | 0 refills | Status: DC

## 2016-04-19 MED ORDER — BENZTROPINE MESYLATE 0.5 MG PO TABS: 0.5000 mg | ORAL_TABLET | Freq: Every day | ORAL | 0 refills | Status: DC

## 2016-04-19 NOTE — Progress Notes (Signed)
[place] MD/PA/NP OP Progress Note  04/19/2016 10:48 AM Ruth Duncan  MRN:  [id number]  Chief Complaint:  Subjective:  I like lithium.  It is helping my mood.  HPI: Ruth Duncan came for her follow-up appointment.  On her last appointment we discontinued Lamictal because she did not see any improvement and started on lithium.  She is taking lithium 300 mg twice a day.  She is sleeping better with trazodone but she takes 1-1/2 tablet every night.  She continues to get nasty messages from the mother of grand baby.  Now she is getting help from attorney I like to get paternity test and may be custody if needed.  Patient told that her son's ex-girlfriend is due in July.  Overall she like lithium.  She is less irritable and less angry.  She sleeping better with trazodone.  She denies any mania or any suicidal thoughts but continued to endorse some time hallucination and having trust issues.  She is more relaxed and calm since addition of lithium.  She takes Xanax twice a week as needed when she get very frustrated or received any nasty message from her son's ex girlfriend.  She has no tremors shakes or any EPS.  She is taking lithium, Lexapro, trazodone as prescribed.  Patient lives with her husband and 2 children.  Patient denies drinking alcohol or using any illegal substances.  She continues to have trust issues with her husband and this coming week and her husband has to fly [facility] but she does not let him go alone and she is also going with him.  Her appetite is okay.  Her energy level is better.  Her vital signs are stable.  Visit Diagnosis:    ICD-9-CM ICD-10-CM   1. Encounter for long-term (current) use of medications V58.69 Z79.899 Hemoglobin A1c     COMPLETE METABOLIC PANEL WITH GFR     Lithium level     CBC with Differential/Platelet  2. Schizoaffective disorder, depressive type ([place]) 295.70 F25.1 traZODone (DESYREL) 100 MG tablet  3. Schizoaffective disorder, bipolar type ([facility]) 295.70 F25.0  benztropine (COGENTIN) 0.5 MG tablet     methylphenidate (RITALIN) 10 MG tablet     lithium 300 MG tablet     ALPRAZolam (XANAX) 0.5 MG tablet     Hemoglobin A1c     COMPLETE METABOLIC PANEL WITH GFR     Lithium level     CBC with Differential/Platelet    Past Psychiatric History: Reviewed. Patient has history of taking overdose in the past requires inpatient treatment. She had history of using drugs. She has history of paranoia and hallucination. She was also diagnosed with ADD. In the past she had tried Abilify, lithium, Vyvanse, Seroquel, fanpat and Lamictal. She used to see Dr. Robina Ade. In the past she had done CD IOP.  Past Medical History:  Past Medical History:  Diagnosis Date  . Bipolar disorder ([place])   . Chronic abdominal pain    hx of IBS-D, ? bile salt-induced diarrhea  . Chronic eczema of hand 2012   BILATERAL ON MTX SINCE 2014  . Complication of anesthesia   . Diverticulitis   . Ovarian cancer ([place]) 2010  . Surgical menopause 2010  . Tubular adenoma 2010    Past Surgical History:  Procedure Laterality Date  . ABDOMINAL HYSTERECTOMY  2010  . APPENDECTOMY  2004  . BACTERIAL OVERGROWTH TEST N/A 09/01/2015   Procedure: BACTERIAL OVERGROWTH TEST;  Surgeon: Danie Binder, MD;  [facility];  Service: Endoscopy;  Laterality: N/A;  700  . CESAREAN SECTION  1998  . COLONOSCOPY  12/2008   tubular adenomas, negative microscopic colitis. Due for Flex Sig Dec 2015  . ESOPHAGOGASTRODUODENOSCOPY  05/06/2008   [id number] esophagus without evidence of Barrett's, mass, erosion, ulceration or stricture/ Hiatal hernia/ Normal duodenal bulb and second portion of the duodenum  . ESOPHAGOGASTRODUODENOSCOPY N/A 10/04/2014   Dr. Oneida Alar: patent stricture at GE junction, no dilation, small hiatal hernia, mild non-erosive gastritis (benign), multiple small gastric polyps, benign. Capsule study recommended but never completed   . FLEXIBLE SIGMOIDOSCOPY N/A 02/18/2014   SLF:  Normal small bowel. formed stool at anastomosis. Stool cleared with irrigation. One superfical erosion at the anastomosis. otherwise normal. normal rectal mucosa  . SUBTOTAL COLECTOMY  2011  . TUBAL LIGATION      Family Psychiatric History: Reviewed.  Family History:  Family History  Problem Relation Age of Onset  . Alcohol abuse Paternal Grandfather   . Colon cancer Neg Hx   . Hypertension Mother   . Kidney disease Father   . Hypertension Father   . Coronary artery disease Father     Social History:  Social History   Social History  . Marital status: Married    Spouse name: N/A  . Number of children: N/A  . Years of education: N/A   Occupational History  . Disability     since 2010   Social History Main Topics  . Smoking status: Former Smoker    Packs/day: 1.00    Years: 30.00    Quit date: 11/04/2012  . Smokeless tobacco: Never Used  . Alcohol use No  . Drug use: No  . Sexual activity: Yes    Partners: Male   Other Topics Concern  . Not on file   Social History Narrative   Audreena was born and grew up in [place], [place]. Her parents divorced when she was 34 years of age, and she grew up with her paternal grandmother and grandfather. Her grandfather was an alcoholic, and Javeah found him dead of an MI at the age of 44. Her mother used to lock her in a dark bathroom for an hour or so very frequently, and Lessa continues to have phobias associated with opening a bathroom door. Cela graduated from [employer], but has had no real career because she was unable to hold a job. She has been married for 10 years and has 3 boys, twins age 68 and her oldest son age 86. 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 She is currently attending Alcoholics Anonymous meetings 3 times a week, has a sponsor, and is working the 12 steps.    Allergies: No Known Allergies  Metabolic Disorder Labs: Lab Results  Component Value Date   HGBA1C 5.7  (H) 04/17/2013   MPG 117 (H) 04/17/2013   No results found for: PROLACTIN No results found for: CHOL, TRIG, HDL, CHOLHDL, VLDL, LDLCALC   Current Medications: Current Outpatient Prescriptions  Medication Sig Dispense Refill  . ALPRAZolam (XANAX) 0.5 MG tablet Take 1 tablet (0.5 mg total) by mouth at bedtime. 30 tablet 0  . benztropine (COGENTIN) 0.5 MG tablet Take 1 tablet (0.5 mg total) by mouth at bedtime. 30 tablet 1  . escitalopram (LEXAPRO) 20 MG tablet Take 1 tablet (20 mg total) by mouth daily. 90 tablet 0  . haloperidol (HALDOL) 2 MG tablet Take 1 tab in am and 4 at bed time  450 tablet 0  . lidocaine (XYLOCAINE) 2 % solution 2 TSP  PO 30 MINS PRIOR TO MEALS OR AT BEDTIME PRN FOR CHEST PAIN. REPEAT DOSE EVERY 4 HOURS. NO MORE THAN 8 DOSES A DAY. 300 mL 1  . lithium 300 MG tablet Take 1 tablet (300 mg total) by mouth 2 (two) times daily. 60 tablet 1  . methylphenidate (RITALIN) 10 MG tablet Take 1 tablet (10 mg total) by mouth daily. 30 tablet 0  . nitrofurantoin (MACRODANTIN) 50 MG capsule Take 50 mg by mouth at bedtime.   5  . ondansetron (ZOFRAN) 4 MG tablet Take 1 tablet (4 mg total) by mouth every 8 (eight) hours as needed for nausea or vomiting. 90 tablet 3  . pantoprazole (PROTONIX) 40 MG tablet 1 PO 30 MINS BEFORE BREAKFAST AND SUPPER 60 tablet 11  . promethazine (PHENERGAN) 25 MG tablet Take 1 tablet (25 mg total) by mouth every 6 (six) hours as needed for nausea or vomiting. 40 tablet 1  . traZODone (DESYREL) 50 MG tablet TAKE 1/2 TO 1 TAB AT BEDTIME 30 tablet 0  . valACYclovir (VALTREX) 500 MG tablet Take 500 mg by mouth once a week.      No current facility-administered medications for this visit.     Neurologic: Headache: No Seizure: No Paresthesias: No  Musculoskeletal: Strength & Muscle Tone: within normal limits Gait & Station: normal Patient leans: N/A  Psychiatric Specialty Exam: ROS  Blood pressure 128/74, pulse 68, height 5\' 3"  (1.6 m), weight 123 lb  12.8 oz (56.2 kg).There is no height or weight on file to calculate BMI.  General Appearance: Casual and Pleasant  Eye Contact:  Good  Speech:  Clear and Coherent  Volume:  Normal  Mood:  Anxious  Affect:  Congruent  Thought Process:  Goal Directed  Orientation:  Full (Time, Place, and Person)  Thought Content: Hallucinations: Auditory Visual non specific hallucinations   Suicidal Thoughts:  No  Homicidal Thoughts:  No  Memory:  Immediate;   Good Recent;   Good Remote;   Good  Judgement:  Good  Insight:  Good  Psychomotor Activity:  Normal  Concentration:  Concentration: Good and Attention Span: Fair  Recall:  Good  Fund of Knowledge: Good  Language: Good  Akathisia:  No  Handed:  Right  AIMS (if indicated):  0  Assets:  Communication Skills Desire for Improvement Housing Resilience  ADL's:  Intact  Cognition: WNL  Sleep:  Good with trazodone    Assessment: Schizoaffective disorder bipolar type.  ADHD by history.  Anxiety disorder NOS  Plan: Discusses psychosocial issues and patient agreed that this may be causing some time nervousness and anxiety but she is hoping that hiring an attorney is very helpful.  She really like lithium.  I would increase lithium 300 mg 3 times a day and we will get blood work including lithium level.  Continue Lexapro 20 mg daily, Cogentin 0.5 mg at bedtime, Ritalin 10 mg daily.  I will increase trazodone 100 mg at bedtime it is helping her sleep.  She is taking Xanax 0.5 mg only as needed.  We will provide 20 more tablets if needed for severe anxiety.  Discuss in length medication side effects and polypharmacy.  Discuss about potential abuse of benzodiazepine including withdrawal and tolerance.  Patient does not ask for early refills.  She is not interested in counseling at this time.  Recommended to call us back if she has any question, concern or if she  feels worsening of the symptoms.  Follow-up in 6 weeks. ARFEEN,SYED T., MD 04/19/2016, 10:48  AM

## 2016-04-29 ENCOUNTER — Other Ambulatory Visit (HOSPITAL_COMMUNITY): Payer: Self-pay | Admitting: Psychiatry

## 2016-04-29 DIAGNOSIS — F250 Schizoaffective disorder, bipolar type: Secondary | ICD-10-CM

## 2016-05-03 ENCOUNTER — Other Ambulatory Visit (HOSPITAL_COMMUNITY): Payer: Self-pay | Admitting: Psychiatry

## 2016-05-17 DIAGNOSIS — Z79899 Other long term (current) drug therapy: Secondary | ICD-10-CM | POA: Diagnosis not present

## 2016-05-17 DIAGNOSIS — F250 Schizoaffective disorder, bipolar type: Secondary | ICD-10-CM | POA: Diagnosis not present

## 2016-05-17 LAB — CBC WITH DIFFERENTIAL/PLATELET
Basophils Absolute: 0 cells/uL (ref 0–200)
Basophils Relative: 0 %
Eosinophils Absolute: 201 cells/uL (ref 15–500)
Eosinophils Relative: 3 %
HCT: 39.2 % (ref 35.0–45.0)
Hemoglobin: 13 g/dL (ref 11.7–15.5)
Lymphocytes Relative: 20 %
Lymphs Abs: 1340 cells/uL (ref 850–3900)
MCH: 30.6 pg (ref 27.0–33.0)
MCHC: 33.2 g/dL (ref 32.0–36.0)
MCV: 92.2 fL (ref 80.0–100.0)
MPV: 10 fL (ref 7.5–12.5)
Monocytes Absolute: 469 cells/uL (ref 200–950)
Monocytes Relative: 7 %
Neutro Abs: 4690 cells/uL (ref 1500–7800)
Neutrophils Relative %: 70 %
Platelets: 255 10*3/uL (ref 140–400)
RBC: 4.25 MIL/uL (ref 3.80–5.10)
RDW: 12.9 % (ref 11.0–15.0)
WBC: 6.7 10*3/uL (ref 3.8–10.8)

## 2016-05-18 LAB — COMPLETE METABOLIC PANEL WITH GFR
ALT: 10 U/L (ref 6–29)
AST: 11 U/L (ref 10–35)
Albumin: 4 g/dL (ref 3.6–5.1)
Alkaline Phosphatase: 51 U/L (ref 33–115)
BUN: 15 mg/dL (ref 7–25)
CO2: 22 mmol/L (ref 20–31)
Calcium: 8.6 mg/dL (ref 8.6–10.2)
Chloride: 104 mmol/L (ref 98–110)
Creat: 0.75 mg/dL (ref 0.50–1.10)
GFR, Est African American: >89 mL/min (ref >=60)
GFR, Est Non African American: >89 mL/min (ref >=60)
Glucose, Bld: 78 mg/dL (ref 65–99)
Potassium: 4.1 mmol/L (ref 3.5–5.3)
Sodium: 141 mmol/L (ref 135–146)
Total Bilirubin: 0.4 mg/dL (ref 0.2–1.2)
Total Protein: 6.6 g/dL (ref 6.1–8.1)

## 2016-05-18 LAB — HEMOGLOBIN A1C
Hgb A1c MFr Bld: 5.2 % (ref <5.7)
Mean Plasma Glucose: 103 mg/dL

## 2016-05-18 LAB — LITHIUM LEVEL: Lithium Lvl: <0.1 mmol/L — ABNORMAL LOW (ref 0.80–1.40)

## 2016-05-20 ENCOUNTER — Ambulatory Visit: Payer: Medicare Other | Admitting: Gastroenterology

## 2016-06-14 ENCOUNTER — Ambulatory Visit (HOSPITAL_COMMUNITY): Payer: Self-pay | Admitting: Psychiatry

## 2016-06-15 ENCOUNTER — Other Ambulatory Visit (HOSPITAL_COMMUNITY): Payer: Self-pay | Admitting: Psychiatry

## 2016-06-15 DIAGNOSIS — F250 Schizoaffective disorder, bipolar type: Secondary | ICD-10-CM

## 2016-06-21 ENCOUNTER — Encounter (HOSPITAL_COMMUNITY): Payer: Self-pay | Admitting: Psychiatry

## 2016-06-21 ENCOUNTER — Ambulatory Visit (INDEPENDENT_AMBULATORY_CARE_PROVIDER_SITE_OTHER): Payer: Medicare Other | Admitting: Psychiatry

## 2016-06-21 VITALS — BP 100/64 | HR 62 | Ht 63.0 in | Wt 121.0 lb

## 2016-06-21 DIAGNOSIS — F900 Attention-deficit hyperactivity disorder, predominantly inattentive type: Secondary | ICD-10-CM

## 2016-06-21 DIAGNOSIS — Z811 Family history of alcohol abuse and dependence: Secondary | ICD-10-CM

## 2016-06-21 DIAGNOSIS — Z79899 Other long term (current) drug therapy: Secondary | ICD-10-CM | POA: Diagnosis not present

## 2016-06-21 DIAGNOSIS — Z87891 Personal history of nicotine dependence: Secondary | ICD-10-CM

## 2016-06-21 DIAGNOSIS — F250 Schizoaffective disorder, bipolar type: Secondary | ICD-10-CM | POA: Diagnosis not present

## 2016-06-21 DIAGNOSIS — F419 Anxiety disorder, unspecified: Secondary | ICD-10-CM

## 2016-06-21 DIAGNOSIS — F251 Schizoaffective disorder, depressive type: Secondary | ICD-10-CM

## 2016-06-21 DIAGNOSIS — F909 Attention-deficit hyperactivity disorder, unspecified type: Secondary | ICD-10-CM | POA: Diagnosis not present

## 2016-06-21 MED ORDER — TRAZODONE HCL 100 MG PO TABS: ORAL_TABLET | ORAL | 0 refills | Status: DC

## 2016-06-21 MED ORDER — METHYLPHENIDATE HCL 10 MG PO TABS: 10.0000 mg | ORAL_TABLET | Freq: Every day | ORAL | 0 refills | Status: DC

## 2016-06-21 MED ORDER — BENZTROPINE MESYLATE 0.5 MG PO TABS: 0.5000 mg | ORAL_TABLET | Freq: Every day | ORAL | 0 refills | Status: DC

## 2016-06-21 MED ORDER — ALPRAZOLAM 0.5 MG PO TABS: 0.5000 mg | ORAL_TABLET | Freq: Every day | ORAL | 0 refills | Status: DC

## 2016-06-21 MED ORDER — LITHIUM CARBONATE 300 MG PO TABS: 600.0000 mg | ORAL_TABLET | Freq: Two times a day (BID) | ORAL | 1 refills | Status: DC

## 2016-06-21 MED ORDER — HALOPERIDOL 2 MG PO TABS: ORAL_TABLET | ORAL | 0 refills | Status: DC

## 2016-06-21 MED ORDER — LITHIUM CARBONATE 300 MG PO TABS: 300.0000 mg | ORAL_TABLET | Freq: Three times a day (TID) | ORAL | 1 refills | Status: DC

## 2016-06-21 MED ORDER — ESCITALOPRAM OXALATE 10 MG PO TABS: 10.0000 mg | ORAL_TABLET | Freq: Every day | ORAL | 0 refills | Status: DC

## 2016-06-21 NOTE — Progress Notes (Signed)
[facility] MD/PA/NP OP Progress Note  06/21/2016 9:19 AM Ruth Duncan  MRN:  [id number]  Chief Complaint:  Chief Complaint    Follow-up     Subjective:  I still have some time manic symptoms.  I still hear voices.  HPI: Ruth Duncan came for her follow-up appointment.  She just came back from vacation.  She went with her family to [place] and she had a very good time.  She is taking her medication.  She still have sometimes manic symptoms and continued to endorse hallucination.  Her biggest stress is mother from [place].  Patient admitted that she is lately very obsessed about that.  She is cleaning the house, make sure that crib and other things about the baby is okay.  Baby is due in first week of July.  She continues to get nasty messages from the mother of the grandbaby .  She is not sure if they will get custody but she had a very good lawyer is working on the case.  She admitted some time poor sleep because she is thinking about the baby .  She endorses relationship with the husband is much improved.  Together they are now seeing therapy through the church.  She has no tremors or shakes.  She takes Xanax only if she is very anxious and nervous.  Patient denies any agitation, anger, suicidal thoughts.  She denies any crying spells but sometimes she feels emotional and continued to endorse difficulty trusting her husband.  Patient lives with her husband and 2 children.  Patient denies drinking alcohol or using any illegal substances.  Her appetite is okay.  She is pleased that she is able to lose weight from the past.  She is taking multiple medication .  Patient had blood work on May 14 and her lithium level is less than 0.1.  But patient told that she's been taking the medication as prescribed and she had never missed the dose.  Her hemoglobin K0X, CBC and metabolic panel is normal.  Visit Diagnosis:    ICD-10-CM   1. Schizoaffective disorder, depressive type ([place]) F25.1 traZODone (DESYREL) 100 MG tablet     lithium 300 MG tablet    Lithium level  2. Schizoaffective disorder, bipolar type ([facility]) F25.0 haloperidol (HALDOL) 2 MG tablet    escitalopram (LEXAPRO) 10 MG tablet    benztropine (COGENTIN) 0.5 MG tablet    ALPRAZolam (XANAX) 0.5 MG tablet    methylphenidate (RITALIN) 10 MG tablet    DISCONTINUED: lithium 300 MG tablet    DISCONTINUED: methylphenidate (RITALIN) 10 MG tablet  3. Attention deficit hyperactivity disorder (ADHD), predominantly inattentive type F90.0     Past Psychiatric History: Reviewed.   Patient has history of taking overdose in the past requires inpatient treatment. She had history of using drugs. She has history of paranoia and hallucination. She was also diagnosed with ADD. In the past she had tried Abilify, lithium, Vyvanse, Seroquel, fanpat and Lamictal. She used to see Dr. Robina Ade. In the past she had done CD IOP.  Past Medical History:  Past Medical History:  Diagnosis Date  . Bipolar disorder ([place])   . Chronic abdominal pain    hx of IBS-D, ? bile salt-induced diarrhea  . Chronic eczema of hand 2012   BILATERAL ON MTX SINCE 2014  . Complication of anesthesia   . Diverticulitis   . Ovarian cancer ([place]) 2010  . Surgical menopause 2010  . Tubular adenoma 2010    Past Surgical History:  Procedure  Laterality Date  . ABDOMINAL HYSTERECTOMY  2010  . APPENDECTOMY  2004  . BACTERIAL OVERGROWTH TEST N/A 09/01/2015   Procedure: BACTERIAL OVERGROWTH TEST;  Surgeon: Danie Binder, MD;  [facility];  Service: Endoscopy;  Laterality: N/A;  700  . CESAREAN SECTION  1998  . COLONOSCOPY  12/2008   tubular adenomas, negative microscopic colitis. Due for Flex Sig Dec 2015  . ESOPHAGOGASTRODUODENOSCOPY  05/06/2008   [id number] esophagus without evidence of Barrett's, mass, erosion, ulceration or stricture/ Hiatal hernia/ Normal duodenal bulb and second portion of the duodenum  . ESOPHAGOGASTRODUODENOSCOPY N/A 10/04/2014   Dr. Oneida Alar: patent stricture at GE  junction, no dilation, small hiatal hernia, mild non-erosive gastritis (benign), multiple small gastric polyps, benign. Capsule study recommended but never completed   . FLEXIBLE SIGMOIDOSCOPY N/A 02/18/2014   SLF: Normal small bowel. formed stool at anastomosis. Stool cleared with irrigation. One superfical erosion at the anastomosis. otherwise normal. normal rectal mucosa  . SUBTOTAL COLECTOMY  2011  . TUBAL LIGATION      Family Psychiatric History: Reviewed.   Family History:  Family History  Problem Relation Age of Onset  . Alcohol abuse Paternal Grandfather   . Hypertension Mother   . Kidney disease Father   . Hypertension Father   . Coronary artery disease Father   . Colon cancer Neg Hx     Social History:  Social History   Social History  . Marital status: Married    Spouse name: N/A  . Number of children: N/A  . Years of education: N/A   Occupational History  . Disability     since 2010   Social History Main Topics  . Smoking status: Former Smoker    Packs/day: 1.00    Years: 30.00    Quit date: 11/04/2012  . Smokeless tobacco: Never Used  . Alcohol use No  . Drug use: No  . Sexual activity: Yes    Partners: Male    Birth control/ protection: None   Other Topics Concern  . None   Social History Narrative   Ruth Duncan was born and grew up in [place], [place]. Her parents divorced when she was 41 years of age, and she grew up with her paternal grandmother and grandfather. Her grandfather was an alcoholic, and Ruth Duncan found him dead of an MI at the age of 1. Her mother used to lock her in a dark bathroom for an hour or so very frequently, and Ruth Duncan continues to have phobias associated with opening a bathroom door. Ruth Duncan graduated from [employer], but has had no real career because she was unable to hold a job. She has been married for 47 years and has 3 boys, twins age 17 and her oldest son age 12. 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 She is currently attending Alcoholics Anonymous meetings 3 times a week, has a sponsor, and is working the 12 steps.    Allergies: No Known Allergies  Metabolic Disorder Labs: Recent Results (from the past 2160 hour(s))  Hemoglobin A1c     Status: None   Collection Time: 05/17/16 12:32 PM  Result Value Ref Range   Hgb A1c MFr Bld 5.2 <5.7 %    Comment:   For the purpose of screening for the presence of diabetes:   <5.7%       Consistent with the absence of diabetes 5.7-6.4 %   Consistent with increased risk for diabetes (prediabetes) >=  6.5 %     Consistent with diabetes   This assay result is consistent with a decreased risk of diabetes.   Currently, no consensus exists regarding use of hemoglobin A1c for diagnosis of diabetes in children.   According to American Diabetes Association (ADA) guidelines, hemoglobin A1c <7.0% represents optimal control in non-pregnant diabetic patients. Different metrics may apply to specific patient populations. Standards of Medical Care in Diabetes (ADA).      Mean Plasma Glucose 103 mg/dL  COMPLETE METABOLIC PANEL WITH GFR     Status: None   Collection Time: 05/17/16 12:32 PM  Result Value Ref Range   Sodium 141 135 - 146 mmol/L   Potassium 4.1 3.5 - 5.3 mmol/L   Chloride 104 98 - 110 mmol/L   CO2 22 20 - 31 mmol/L   Glucose, Bld 78 65 - 99 mg/dL   BUN 15 7 - 25 mg/dL   Creat 0.75 0.50 - 1.10 mg/dL   Total Bilirubin 0.4 0.2 - 1.2 mg/dL   Alkaline Phosphatase 51 33 - 115 U/L   AST 11 10 - 35 U/L   ALT 10 6 - 29 U/L   Total Protein 6.6 6.1 - 8.1 g/dL   Albumin 4.0 3.6 - 5.1 g/dL   Calcium 8.6 8.6 - 10.2 mg/dL   GFR, Est African American >89 >=60 mL/min   GFR, Est Non African American >89 >=60 mL/min  Lithium level     Status: Abnormal   Collection Time: 05/17/16 12:32 PM  Result Value Ref Range   Lithium Lvl <0.1 (L) 0.80 - 1.40 mmol/L    Comment: Result repeated and verified. ** Please note change in unit of measure  and reference range(s). **   CBC with Differential/Platelet     Status: None   Collection Time: 05/17/16 12:32 PM  Result Value Ref Range   WBC 6.7 3.8 - 10.8 K/uL   RBC 4.25 3.80 - 5.10 MIL/uL   Hemoglobin 13.0 11.7 - 15.5 g/dL   HCT 39.2 35.0 - 45.0 %   MCV 92.2 80.0 - 100.0 fL   MCH 30.6 27.0 - 33.0 pg   MCHC 33.2 32.0 - 36.0 g/dL   RDW 12.9 11.0 - 15.0 %   Platelets 255 140 - 400 K/uL   MPV 10.0 7.5 - 12.5 fL   Neutro Abs 4,690 1,500 - 7,800 cells/uL   Lymphs Abs 1,340 850 - 3,900 cells/uL   Monocytes Absolute 469 200 - 950 cells/uL   Eosinophils Absolute 201 15 - 500 cells/uL   Basophils Absolute 0 0 - 200 cells/uL   Neutrophils Relative % 70 %   Lymphocytes Relative 20 %   Monocytes Relative 7 %   Eosinophils Relative 3 %   Basophils Relative 0 %   Smear Review Criteria for review not met    Lab Results  Component Value Date   HGBA1C 5.2 05/17/2016   MPG 103 05/17/2016   MPG 117 (H) 04/17/2013   No results found for: PROLACTIN No results found for: CHOL, TRIG, HDL, CHOLHDL, VLDL, LDLCALC   Current Medications: Current Outpatient Prescriptions  Medication Sig Dispense Refill  . ALPRAZolam (XANAX) 0.5 MG tablet Take 1 tablet (0.5 mg total) by mouth at bedtime. 20 tablet 0  . benztropine (COGENTIN) 0.5 MG tablet Take 1 tablet (0.5 mg total) by mouth at bedtime. 90 tablet 0  . escitalopram (LEXAPRO) 10 MG tablet Take 1 tablet (10 mg total) by mouth daily. 90 tablet 0  . haloperidol (HALDOL) 2  MG tablet Take 1 tab in am and 4 at bed time 450 tablet 0  . lidocaine (XYLOCAINE) 2 % solution 2 TSP  PO 30 MINS PRIOR TO MEALS OR AT BEDTIME PRN FOR CHEST PAIN. REPEAT DOSE EVERY 4 HOURS. NO MORE THAN 8 DOSES A DAY. 300 mL 1  . methylphenidate (RITALIN) 10 MG tablet Take 1 tablet (10 mg total) by mouth daily. Do not refill until 07/21/16 30 tablet 0  . nitrofurantoin (MACRODANTIN) 50 MG capsule Take 50 mg by mouth at bedtime.   5  . ondansetron (ZOFRAN) 4 MG tablet Take 1 tablet  (4 mg total) by mouth every 8 (eight) hours as needed for nausea or vomiting. 90 tablet 3  . pantoprazole (PROTONIX) 40 MG tablet 1 PO 30 MINS BEFORE BREAKFAST AND SUPPER 60 tablet 11  . promethazine (PHENERGAN) 25 MG tablet Take 1 tablet (25 mg total) by mouth every 6 (six) hours as needed for nausea or vomiting. 40 tablet 1  . traZODone (DESYREL) 100 MG tablet TAKE 1 TAB AT BEDTIME 90 tablet 0  . valACYclovir (VALTREX) 500 MG tablet Take 500 mg by mouth once a week.     . lithium 300 MG tablet Take 2 tablets (600 mg total) by mouth 2 (two) times daily. 120 tablet 1   No current facility-administered medications for this visit.     Neurologic: Headache: No Seizure: No Paresthesias: No  Musculoskeletal: Strength & Muscle Tone: within normal limits Gait & Station: normal Patient leans: N/A  Psychiatric Specialty Exam: Review of Systems  Constitutional: Positive for weight loss.  HENT: Negative.   Eyes: Negative.   Respiratory: Negative.   Gastrointestinal: Negative.   Genitourinary: Negative.   Musculoskeletal: Negative.   Skin: Negative for itching and rash.  Neurological: Negative.  Negative for tingling.  Endo/Heme/Allergies: Negative.   Psychiatric/Behavioral: Positive for hallucinations.    Blood pressure 100/64, pulse 62, height '5\' 3"'  (1.6 m), weight 121 lb (54.9 kg), SpO2 99 %.Body mass index is 21.43 kg/m.  General Appearance: Casual and Well Groomed  Eye Contact:  Good  Speech:  Clear and Coherent and Fast  Volume:  Normal  Mood:  Anxious  Affect:  Appropriate and Labile  Thought Process:  Goal Directed  Orientation:  Full (Time, Place, and Person)  Thought Content: Hallucinations: Auditory Visual Seeing bugs and some time hearing voices that people calling her name   Suicidal Thoughts:  No  Homicidal Thoughts:  No  Memory:  Immediate;   Fair Recent;   Good Remote;   Good  Judgement:  Good  Insight:  Good  Psychomotor Activity:  Normal  Concentration:   Concentration: Good and Attention Span: Good  Recall:  Good  Fund of Knowledge: Good  Language: Good  Akathisia:  No  Handed:  Right  AIMS (if indicated):  0  Assets:  Communication Skills Desire for [place] Support Transportation  ADL's:  Intact  Cognition: WNL  Sleep:  Good    Assessment: Schizoaffective disorder bipolar type.  ADHD by history.  Anxiety disorder NOS.  Plan: I review blood work results .  Her hemoglobin A1c is normal.  However her lithium level is very low.  We talked about compliance and patient denied missing the dose .  I will increase lithium 600 mg twice a day since it is helping her manic symptoms.  She still has residual mania and I recommended to try reducing Lexapro 10 mg.  She is more stressed about  grandbaby who is due on first week of July.  She has no tremors or shakes.  She is happy that she lost weight.  Continue Ritalin 10 mg daily, Cogentin 0.5 mg at bedtime, trazodone 100 mg at bedtime , Xanax 0.5 mg only as needed for severe anxiety .  Haldol 2 mg in the morning and 4 mg at bedtime .  Discuss in length agitation side effects especially benzodiazepine and stimulant abuse, tolerance and withdrawal.  We will repeat lithium level after 4 weeks .  Patient was given orders for lithium level .  Discuss safety concern that anytime having active suicidal thoughts or homicidal thoughts and she need to call 911 or the local emergency room.  Follow-up in 4 weeks.  Time spent 25 minutes.  More than 50% of the time spent in psychoeducation, counseling and coordination of care.  Discuss safety plan that anytime having active suicidal thoughts or homicidal thoughts then patient need to call 911 or go to the local emergency room.   Jemeka Wagler T., MD 06/21/2016, 9:19 AM

## 2016-06-29 ENCOUNTER — Other Ambulatory Visit (HOSPITAL_COMMUNITY): Payer: Self-pay | Admitting: Psychiatry

## 2016-06-29 DIAGNOSIS — F250 Schizoaffective disorder, bipolar type: Secondary | ICD-10-CM

## 2016-07-05 ENCOUNTER — Other Ambulatory Visit (HOSPITAL_COMMUNITY): Payer: Self-pay | Admitting: Psychiatry

## 2016-07-05 DIAGNOSIS — F250 Schizoaffective disorder, bipolar type: Secondary | ICD-10-CM

## 2016-07-08 ENCOUNTER — Ambulatory Visit: Payer: Medicare Other | Admitting: Gastroenterology

## 2016-07-19 ENCOUNTER — Ambulatory Visit (INDEPENDENT_AMBULATORY_CARE_PROVIDER_SITE_OTHER): Payer: Medicare Other | Admitting: Psychiatry

## 2016-07-19 ENCOUNTER — Encounter (HOSPITAL_COMMUNITY): Payer: Self-pay | Admitting: Psychiatry

## 2016-07-19 VITALS — BP 102/66 | HR 62 | Ht 63.0 in | Wt 120.0 lb

## 2016-07-19 DIAGNOSIS — F909 Attention-deficit hyperactivity disorder, unspecified type: Secondary | ICD-10-CM | POA: Diagnosis not present

## 2016-07-19 DIAGNOSIS — Z87891 Personal history of nicotine dependence: Secondary | ICD-10-CM | POA: Diagnosis not present

## 2016-07-19 DIAGNOSIS — Z811 Family history of alcohol abuse and dependence: Secondary | ICD-10-CM | POA: Diagnosis not present

## 2016-07-19 DIAGNOSIS — F250 Schizoaffective disorder, bipolar type: Secondary | ICD-10-CM

## 2016-07-19 DIAGNOSIS — F17210 Nicotine dependence, cigarettes, uncomplicated: Secondary | ICD-10-CM | POA: Diagnosis not present

## 2016-07-19 DIAGNOSIS — F419 Anxiety disorder, unspecified: Secondary | ICD-10-CM

## 2016-07-19 DIAGNOSIS — F251 Schizoaffective disorder, depressive type: Secondary | ICD-10-CM

## 2016-07-19 MED ORDER — ALPRAZOLAM 0.5 MG PO TABS: 0.5000 mg | ORAL_TABLET | Freq: Every day | ORAL | 0 refills | Status: DC

## 2016-07-19 NOTE — Progress Notes (Signed)
[facility] MD/PA/NP OP Progress Note  07/19/2016 8:19 AM DJUNA FRECHETTE  MRN:  [id number]  Chief Complaint:  Subjective:  I am doing better with increase lithium.  But I'm frustrated because baby is born and I have not seen the baby.  HPI: [place] came for her follow-up appointment.  On her last visit we increase lithium as she was complaining of manic cycles but now she is doing much better.  She sleeping good.  She denies any recent mania but she is frustrated because she has not seen her newborn grandchild.  She is working with a Chief Executive Officer to get custody because she feels her grandchild will be not safe at his mother's house.  Her son showed her the picture but she had not seen physically.  She admitted relationship with the husband is getting slowly better.  Together they are getting therapy through the church and that's working.  Patient denies any major panic attack or anxiety attack.  She sleeping better.  She continues to get emotional and some time difficulty trusting her husband.  Patient denies drinking alcohol or using any illegal substances.  She admitted lately taking Xanax almost every day because she was upset not able to see her grandchild.  She also forgot her lithium level but promised to do it very soon.  She really liked increase lithium and reported no concern.  Patient denies any paranoia, hallucination or any suicidal thoughts.  Her appetite is okay.  Energy level is good.  Her vital signs are stable.  She is going to [place] with her best friend in few days.  Patient lives with her husband and 2 children.  Visit Diagnosis:    ICD-10-CM   1. Schizoaffective disorder, bipolar type ([place]) F25.0 ALPRAZolam (XANAX) 0.5 MG tablet    Lithium level  2. Schizoaffective disorder, depressive type ([place]) F25.1     Past Psychiatric History: Reviewed.   Patient has history of taking overdose in the past that requires inpatient treatment. She had history of using drugs. She has history of paranoia and  hallucination. She was also diagnosed with ADD. In the past she had tried Abilify, lithium, Vyvanse, Seroquel, fanpatand Lamictal. She used to see Dr. Robina Ade. In the past she had done CD IOP.  Past Medical History:  Past Medical History:  Diagnosis Date  . Bipolar disorder ([place])   . Chronic abdominal pain    hx of IBS-D, ? bile salt-induced diarrhea  . Chronic eczema of hand 2012   BILATERAL ON MTX SINCE 2014  . Complication of anesthesia   . Diverticulitis   . Ovarian cancer ([place]) 2010  . Surgical menopause 2010  . Tubular adenoma 2010    Past Surgical History:  Procedure Laterality Date  . ABDOMINAL HYSTERECTOMY  2010  . APPENDECTOMY  2004  . BACTERIAL OVERGROWTH TEST N/A 09/01/2015   Procedure: BACTERIAL OVERGROWTH TEST;  Surgeon: Danie Binder, MD;  [facility];  Service: Endoscopy;  Laterality: N/A;  700  . CESAREAN SECTION  1998  . COLONOSCOPY  12/2008   tubular adenomas, negative microscopic colitis. Due for Flex Sig Dec 2015  . ESOPHAGOGASTRODUODENOSCOPY  05/06/2008   [id number] esophagus without evidence of Barrett's, mass, erosion, ulceration or stricture/ Hiatal hernia/ Normal duodenal bulb and second portion of the duodenum  . ESOPHAGOGASTRODUODENOSCOPY N/A 10/04/2014   Dr. Oneida Alar: patent stricture at GE junction, no dilation, small hiatal hernia, mild non-erosive gastritis (benign), multiple small gastric polyps, benign. Capsule study recommended but never completed   .  FLEXIBLE SIGMOIDOSCOPY N/A 02/18/2014   SLF: Normal small bowel. formed stool at anastomosis. Stool cleared with irrigation. One superfical erosion at the anastomosis. otherwise normal. normal rectal mucosa  . SUBTOTAL COLECTOMY  2011  . TUBAL LIGATION      Family Psychiatric History: Reviewed.  Family History:  Family History  Problem Relation Age of Onset  . Alcohol abuse Paternal Grandfather   . Hypertension Mother   . Kidney disease Father   . Hypertension Father   . Coronary  artery disease Father   . Colon cancer Neg Hx     Social History:  Social History   Social History  . Marital status: Married    Spouse name: N/A  . Number of children: N/A  . Years of education: N/A   Occupational History  . Disability     since 2010   Social History Main Topics  . Smoking status: Former Smoker    Packs/day: 1.00    Years: 30.00    Quit date: 11/04/2012  . Smokeless tobacco: Never Used  . Alcohol use No  . Drug use: No  . Sexual activity: Yes    Partners: Male    Birth control/ protection: None   Other Topics Concern  . Not on file   Social History Narrative   Rosangela was born and grew up in [place], [place]. Her parents divorced when she was 68 years of age, and she grew up with her paternal grandmother and grandfather. Her grandfather was an alcoholic, and Sanna found him dead of an MI at the age of 51. Her mother used to lock her in a dark bathroom for an hour or so very frequently, and Bridgette continues to have phobias associated with opening a bathroom door. Kalaysia graduated from [employer], but has had no real career because she was unable to hold a job. She has been married for 69 years and has 3 boys, twins age 41 and her oldest son age 52. 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 She is currently attending Alcoholics Anonymous meetings 3 times a week, has a sponsor, and is working the 12 steps.    Allergies: No Known Allergies  Metabolic Disorder Labs: Lab Results  Component Value Date   HGBA1C 5.2 05/17/2016   MPG 103 05/17/2016   MPG 117 (H) 04/17/2013   No results found for: PROLACTIN No results found for: CHOL, TRIG, HDL, CHOLHDL, VLDL, LDLCALC   Current Medications: Current Outpatient Prescriptions  Medication Sig Dispense Refill  . ALPRAZolam (XANAX) 0.5 MG tablet Take 1 tablet (0.5 mg total) by mouth at bedtime. 20 tablet 0  . benztropine (COGENTIN) 0.5 MG tablet Take 1 tablet (0.5 mg total)  by mouth at bedtime. 90 tablet 0  . escitalopram (LEXAPRO) 10 MG tablet Take 1 tablet (10 mg total) by mouth daily. 90 tablet 0  . haloperidol (HALDOL) 2 MG tablet Take 1 tab in am and 4 at bed time 450 tablet 0  . lidocaine (XYLOCAINE) 2 % solution 2 TSP  PO 30 MINS PRIOR TO MEALS OR AT BEDTIME PRN FOR CHEST PAIN. REPEAT DOSE EVERY 4 HOURS. NO MORE THAN 8 DOSES A DAY. 300 mL 1  . lithium 300 MG tablet Take 2 tablets (600 mg total) by mouth 2 (two) times daily. 120 tablet 1  . methylphenidate (RITALIN) 10 MG tablet Take 1 tablet (10 mg total) by mouth daily. Do not refill until 07/21/16 30 tablet 0  .  nitrofurantoin (MACRODANTIN) 50 MG capsule Take 50 mg by mouth at bedtime.   5  . ondansetron (ZOFRAN) 4 MG tablet Take 1 tablet (4 mg total) by mouth every 8 (eight) hours as needed for nausea or vomiting. 90 tablet 3  . pantoprazole (PROTONIX) 40 MG tablet 1 PO 30 MINS BEFORE BREAKFAST AND SUPPER 60 tablet 11  . promethazine (PHENERGAN) 25 MG tablet Take 1 tablet (25 mg total) by mouth every 6 (six) hours as needed for nausea or vomiting. 40 tablet 1  . traZODone (DESYREL) 100 MG tablet TAKE 1 TAB AT BEDTIME 90 tablet 0  . valACYclovir (VALTREX) 500 MG tablet Take 500 mg by mouth once a week.      No current facility-administered medications for this visit.     Neurologic: Headache: No Seizure: No Paresthesias: No  Musculoskeletal: Strength & Muscle Tone: within normal limits Gait & Station: normal Patient leans: N/A  Psychiatric Specialty Exam: ROS  Blood pressure 102/66, pulse 62, height 5\' 3"  (1.6 m), weight 120 lb (54.4 kg).Body mass index is 21.26 kg/m.  General Appearance: Casual and Well Groomed  Eye Contact:  Good  Speech:  Clear and Coherent  Volume:  Normal  Mood:  Anxious  Affect:  Appropriate  Thought Process:  Goal Directed  Orientation:  Full (Time, Place, and Person)  Thought Content: Logical   Suicidal Thoughts:  No  Homicidal Thoughts:  No  Memory:  Immediate;    Good Recent;   Good Remote;   Good  Judgement:  Good  Insight:  Good  Psychomotor Activity:  Normal  Concentration:  Concentration: Good and Attention Span: Good  Recall:  Good  Fund of Knowledge: Good  Language: Good  Akathisia:  No  Handed:  Right  AIMS (if indicated):  0  Assets:  Communication Skills Desire for Improvement Housing Resilience Social Support  ADL's:  Intact  Cognition: WNL  Sleep:  Improved     Assessment: Schizoaffective disorder, bipolar type.  ADHD by history.  Anxiety disorder NOS.  Plan: Patient doing much better since lithium dose increase.  She forgot lithium level but promised to do very soon.  Her paranoia and hallucinations are much better since the dose adjusted.  Continue Lexapro 10 mg daily, Ritalin 10 mg daily, Cogentin 0.5 mg at bedtime, trazodone 100 mg at bedtime, Haldol 2 mg in the morning and 4 mg at bedtime and Xanax 0.5 mg as needed.  She will continue lithium 600 mg twice a day.  She has no rash, itching, tremors or shakes.  Reminded lithium level.  Recommended to call us back if she has any question or any concern.  Follow-up in 2 months.  Thaison Kolodziejski T., MD 07/19/2016, 8:19 AM

## 2016-08-02 DIAGNOSIS — Z01419 Encounter for gynecological examination (general) (routine) without abnormal findings: Secondary | ICD-10-CM | POA: Diagnosis not present

## 2016-08-02 DIAGNOSIS — Z6823 Body mass index (BMI) 23.0-23.9, adult: Secondary | ICD-10-CM | POA: Diagnosis not present

## 2016-08-02 DIAGNOSIS — Z1272 Encounter for screening for malignant neoplasm of vagina: Secondary | ICD-10-CM | POA: Diagnosis not present

## 2016-08-16 ENCOUNTER — Other Ambulatory Visit (HOSPITAL_COMMUNITY): Payer: Self-pay

## 2016-08-16 DIAGNOSIS — F251 Schizoaffective disorder, depressive type: Secondary | ICD-10-CM

## 2016-08-16 MED ORDER — LITHIUM CARBONATE 300 MG PO TABS: 600.0000 mg | ORAL_TABLET | Freq: Two times a day (BID) | ORAL | 0 refills | Status: DC

## 2016-09-08 ENCOUNTER — Ambulatory Visit: Payer: Medicare Other | Admitting: Gastroenterology

## 2016-09-13 DIAGNOSIS — F250 Schizoaffective disorder, bipolar type: Secondary | ICD-10-CM | POA: Diagnosis not present

## 2016-09-14 LAB — LITHIUM LEVEL: Lithium Lvl: <0.3 mmol/L — ABNORMAL LOW (ref 0.6–1.2)

## 2016-09-20 ENCOUNTER — Ambulatory Visit (INDEPENDENT_AMBULATORY_CARE_PROVIDER_SITE_OTHER): Payer: Medicare Other | Admitting: Psychiatry

## 2016-09-20 ENCOUNTER — Encounter (HOSPITAL_COMMUNITY): Payer: Self-pay | Admitting: Psychiatry

## 2016-09-20 DIAGNOSIS — R454 Irritability and anger: Secondary | ICD-10-CM | POA: Diagnosis not present

## 2016-09-20 DIAGNOSIS — F250 Schizoaffective disorder, bipolar type: Secondary | ICD-10-CM | POA: Diagnosis not present

## 2016-09-20 DIAGNOSIS — F251 Schizoaffective disorder, depressive type: Secondary | ICD-10-CM

## 2016-09-20 DIAGNOSIS — Z811 Family history of alcohol abuse and dependence: Secondary | ICD-10-CM

## 2016-09-20 DIAGNOSIS — Z87891 Personal history of nicotine dependence: Secondary | ICD-10-CM | POA: Diagnosis not present

## 2016-09-20 DIAGNOSIS — F39 Unspecified mood [affective] disorder: Secondary | ICD-10-CM | POA: Diagnosis not present

## 2016-09-20 DIAGNOSIS — F909 Attention-deficit hyperactivity disorder, unspecified type: Secondary | ICD-10-CM | POA: Diagnosis not present

## 2016-09-20 DIAGNOSIS — F419 Anxiety disorder, unspecified: Secondary | ICD-10-CM

## 2016-09-20 MED ORDER — ESCITALOPRAM OXALATE 10 MG PO TABS: 10.0000 mg | ORAL_TABLET | Freq: Every day | ORAL | 0 refills | Status: DC

## 2016-09-20 MED ORDER — TRAZODONE HCL 100 MG PO TABS: ORAL_TABLET | ORAL | 0 refills | Status: DC

## 2016-09-20 MED ORDER — ALPRAZOLAM 0.5 MG PO TABS: 0.5000 mg | ORAL_TABLET | Freq: Every day | ORAL | 0 refills | Status: DC

## 2016-09-20 MED ORDER — METHYLPHENIDATE HCL 10 MG PO TABS: 10.0000 mg | ORAL_TABLET | Freq: Every day | ORAL | 0 refills | Status: DC

## 2016-09-20 MED ORDER — LITHIUM CARBONATE 300 MG PO TABS: ORAL_TABLET | ORAL | 1 refills | Status: DC

## 2016-09-20 MED ORDER — HALOPERIDOL 2 MG PO TABS: ORAL_TABLET | ORAL | 0 refills | Status: DC

## 2016-09-20 NOTE — Progress Notes (Signed)
[facility] MD/PA/NP OP Progress Note  09/20/2016 9:18 AM Ruth Duncan  MRN:  [id number]  Chief Complaint:  I still have mood swing and irritability.  HPI: Ruth Duncan came for her follow-up appointment.  She is taking her medication as prescribed.  Overall she described improvement in her anger but she still have irritability and mood swings.  She was able to see the grandbaby because her mother could not handle her and decided to drop off on the weekend before going to work.  In the beginning patient was pleased but now she realizes it is not easy to take care of the newborn.  She admitted having issues with her son and now son is moved out.  Patient believed that son has to take responsibility and his best he should live by himself.  Patient has multiple things going on.  His stepfather having chemotherapy for his bladder cancer and she is taking him to the hospitals for appointments.  She is happy that her husband is getting along better.  Though they finish therapy from the church but patient is still going to church and spending time with Bible studies.  She sleeping better.  She is hoping to have a paternity test which she believed the grandbaby belongs to her son.  We have lithium level drawn which is low.  Patient admitted that she still have irritability and mood swing and wondering if lithium dose can be further increase.  She takes Xanax only as needed.  She is taking Ritalin which is helping her ADD symptoms.  She denies any feeling of hopelessness or worthlessness but Sometime emotional and tearful.  She denies any paranoia or any hallucination.  She believe Haldol helping her hallucinations.  Her appetite is okay.  Her vital signs are stable.  Patient is not drinking alcohol or using any illegal substances.  Visit Diagnosis:    ICD-10-CM   1. Schizoaffective disorder, depressive type ([place]) F25.1 traZODone (DESYREL) 100 MG tablet    lithium 300 MG tablet  2. Schizoaffective disorder, bipolar type (HCC)  F25.0 ALPRAZolam (XANAX) 0.5 MG tablet    escitalopram (LEXAPRO) 10 MG tablet    haloperidol (HALDOL) 2 MG tablet    methylphenidate (RITALIN) 10 MG tablet    DISCONTINUED: methylphenidate (RITALIN) 10 MG tablet    Past Psychiatric History: Reviewed. Patient has history of taking overdose in the past that requires inpatient treatment. She had history of using drugs. She has history of paranoia and hallucination. She was also diagnosed with ADD. In the past she had tried Abilify, lithium, Vyvanse, Seroquel, fanpatand Lamictal. She used to see Dr. Robina Ade. In the past she had done [facility] IOP.  Past Medical History:  Past Medical History:  Diagnosis Date  . Bipolar disorder ([place])   . Chronic abdominal pain    hx of IBS-D, ? bile salt-induced diarrhea  . Chronic eczema of hand 2012   BILATERAL ON MTX SINCE 2014  . Complication of anesthesia   . Diverticulitis   . Ovarian cancer ([place]) 2010  . Surgical menopause 2010  . Tubular adenoma 2010    Past Surgical History:  Procedure Laterality Date  . ABDOMINAL HYSTERECTOMY  2010  . APPENDECTOMY  2004  . BACTERIAL OVERGROWTH TEST N/A 09/01/2015   Procedure: BACTERIAL OVERGROWTH TEST;  Surgeon: Danie Binder, MD;  Location: [facility];  Service: Endoscopy;  Laterality: N/A;  700  . CESAREAN SECTION  1998  . COLONOSCOPY  12/2008   tubular adenomas, negative microscopic colitis. Due  for Flex Sig Dec 2015  . ESOPHAGOGASTRODUODENOSCOPY  05/06/2008   [id number] esophagus without evidence of Barrett's, mass, erosion, ulceration or stricture/ Hiatal hernia/ Normal duodenal bulb and second portion of the duodenum  . ESOPHAGOGASTRODUODENOSCOPY N/A 10/04/2014   Dr. Oneida Alar: patent stricture at GE junction, no dilation, small hiatal hernia, mild non-erosive gastritis (benign), multiple small gastric polyps, benign. Capsule study recommended but never completed   . FLEXIBLE SIGMOIDOSCOPY N/A 02/18/2014   SLF: Normal small bowel. formed stool at  anastomosis. Stool cleared with irrigation. One superfical erosion at the anastomosis. otherwise normal. normal rectal mucosa  . SUBTOTAL COLECTOMY  2011  . TUBAL LIGATION      Family Psychiatric History: Reviewed.  Family History:  Family History  Problem Relation Age of Onset  . Alcohol abuse Paternal Grandfather   . Hypertension Mother   . Kidney disease Father   . Hypertension Father   . Coronary artery disease Father   . Colon cancer Neg Hx     Social History:  Social History   Social History  . Marital status: Married    Spouse name: N/A  . Number of children: N/A  . Years of education: N/A   Occupational History  . Disability     since 2010   Social History Main Topics  . Smoking status: Former Smoker    Packs/day: 1.00    Years: 30.00    Quit date: 11/04/2012  . Smokeless tobacco: Never Used  . Alcohol use No  . Drug use: No  . Sexual activity: Yes    Partners: Male    Birth control/ protection: None   Other Topics Concern  . None   Social History Narrative   Ruth Duncan was born and grew up in [place], [place]. Her parents divorced when she was 5 years of age, and she grew up with her paternal grandmother and grandfather. Her grandfather was an alcoholic, and Ruth Duncan found him dead of an MI at the age of 29. Her mother used to lock her in a dark bathroom for an hour or so very frequently, and Rajean continues to have phobias associated with opening a bathroom door. Zandrea graduated from [employer], but has had no real career because she was unable to hold a job. She has been married for 29 years and has 3 boys, twins age 69 and her oldest son age 71. 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 She is currently attending Alcoholics Anonymous meetings 3 times a week, has a sponsor, and is working the 12 steps.    Allergies: No Known Allergies  Metabolic Disorder Labs: Lab Results  Component Value Date   HGBA1C 5.2  05/17/2016   MPG 103 05/17/2016   MPG 117 (H) 04/17/2013   No results found for: PROLACTIN No results found for: CHOL, TRIG, HDL, CHOLHDL, VLDL, LDLCALC Lab Results  Component Value Date   TSH 1.755 04/17/2013    Therapeutic Level Labs: Lab Results  Component Value Date   LITHIUM <0.3 (L) 09/13/2016   LITHIUM <0.1 (L) 05/17/2016   No results found for: VALPROATE No components found for:  CBMZ  Current Medications: Current Outpatient Prescriptions  Medication Sig Dispense Refill  . ALPRAZolam (XANAX) 0.5 MG tablet Take 1 tablet (0.5 mg total) by mouth at bedtime. 20 tablet 0  . benztropine (COGENTIN) 0.5 MG tablet Take 1 tablet (0.5 mg total) by mouth at bedtime. 90 tablet 0  . escitalopram (LEXAPRO) 10 MG  tablet Take 1 tablet (10 mg total) by mouth daily. 90 tablet 0  . haloperidol (HALDOL) 2 MG tablet Take 1 tab in am and 4 at bed time 450 tablet 0  . lidocaine (XYLOCAINE) 2 % solution 2 TSP  PO 30 MINS PRIOR TO MEALS OR AT BEDTIME PRN FOR CHEST PAIN. REPEAT DOSE EVERY 4 HOURS. NO MORE THAN 8 DOSES A DAY. 300 mL 1  . lithium 300 MG tablet Take 2 tablets (600 mg total) by mouth 2 (two) times daily. 120 tablet 0  . methylphenidate (RITALIN) 10 MG tablet Take 1 tablet (10 mg total) by mouth daily. Do not refill until 07/21/16 30 tablet 0  . nitrofurantoin (MACRODANTIN) 50 MG capsule Take 50 mg by mouth at bedtime.   5  . ondansetron (ZOFRAN) 4 MG tablet Take 1 tablet (4 mg total) by mouth every 8 (eight) hours as needed for nausea or vomiting. 90 tablet 3  . pantoprazole (PROTONIX) 40 MG tablet 1 PO 30 MINS BEFORE BREAKFAST AND SUPPER 60 tablet 11  . promethazine (PHENERGAN) 25 MG tablet Take 1 tablet (25 mg total) by mouth every 6 (six) hours as needed for nausea or vomiting. 40 tablet 1  . traZODone (DESYREL) 100 MG tablet TAKE 1 TAB AT BEDTIME 90 tablet 0  . valACYclovir (VALTREX) 500 MG tablet Take 500 mg by mouth once a week.      No current facility-administered medications  for this visit.      Musculoskeletal: Strength & Muscle Tone: within normal limits Gait & Station: normal Patient leans: N/A  Psychiatric Specialty Exam: Review of Systems  Constitutional: Negative.   HENT: Negative.   Eyes: Negative.   Respiratory: Negative.   Cardiovascular: Negative.   Genitourinary: Negative.   Musculoskeletal: Negative.   Skin: Negative.   Neurological: Negative.     Blood pressure 118/68, pulse (!) 58, height 5\' 3"  (1.6 m), weight 119 lb (54 kg).Body mass index is 21.08 kg/m.  General Appearance: Casual  Eye Contact:  Good  Speech:  Clear and Coherent  Volume:  Normal  Mood:  Anxious  Affect:  Labile  Thought Process:  Goal Directed  Orientation:  Full (Time, Place, and Person)  Thought Content: Logical and Rumination   Suicidal Thoughts:  No  Homicidal Thoughts:  No  Memory:  Immediate;   Good Recent;   Good Remote;   Good  Judgement:  Good  Insight:  Good  Psychomotor Activity:  Normal  Concentration:  Concentration: Fair and Attention Span: Fair  Recall:  Good  Fund of Knowledge: Good  Language: Good  Akathisia:  No  Handed:  Right  AIMS (if indicated): not done  Assets:  Communication Skills Desire for Improvement Housing Resilience Social Support  ADL's:  Intact  Cognition: WNL  Sleep:  Good   Screenings:   Assessment and Plan: Schizoaffective disorder bipolar type.  ADHD .  Anxiety disorder NOS.    I review psychosocial stressors, current medication and recent blood work results.  Her lithium level is 0.3.  She has no tremors shakes or any EPS.  We will try increasing lithium dose and she will take 600 mg in the morning 300 mg in the afternoon and 600 mg at bedtime.  Reminded medication side effects especially tremors and shakes.  She will continue Cogentin 0.5 mg at bedtime, trazodone 1 mg half to one tablet as needed for insomnia, Haldol 2 mg in the morning and 4 mg at bedtime, Ritalin 10 mg daily and  Xanax 0.5 mg as needed.   Encouraged to see therapist if needed.  Discuss safety plan that anytime having active suicidal thoughts or homicidal thoughts and she need to call 911 or the local emergency room.  Follow-up in 2 months.Time spent 25 minutes.   Oliviagrace Crisanti T., MD 09/20/2016, 9:18 AM

## 2016-09-27 ENCOUNTER — Telehealth (HOSPITAL_COMMUNITY): Payer: Self-pay

## 2016-09-27 NOTE — Telephone Encounter (Signed)
Medication refill - Fax received from patient's [facility] in [place], [place] for 90 day refill of her prescribed Cogentin.  Pt. last seen 09/20/16 and returns next on 11/22/16

## 2016-09-28 ENCOUNTER — Other Ambulatory Visit (HOSPITAL_COMMUNITY): Payer: Self-pay | Admitting: Psychiatry

## 2016-09-28 DIAGNOSIS — F250 Schizoaffective disorder, bipolar type: Secondary | ICD-10-CM

## 2016-09-28 MED ORDER — BENZTROPINE MESYLATE 0.5 MG PO TABS
0.5000 mg | ORAL_TABLET | Freq: Every day | ORAL | 0 refills | Status: DC
Start: 2016-09-28 — End: 2016-11-22

## 2016-10-04 ENCOUNTER — Other Ambulatory Visit (HOSPITAL_COMMUNITY): Payer: Self-pay | Admitting: Psychiatry

## 2016-10-04 DIAGNOSIS — F250 Schizoaffective disorder, bipolar type: Secondary | ICD-10-CM

## 2016-10-04 NOTE — Telephone Encounter (Signed)
Given 90 day supply on September 20.  Not due until December 20.

## 2016-10-06 DIAGNOSIS — N3000 Acute cystitis without hematuria: Secondary | ICD-10-CM | POA: Diagnosis not present

## 2016-10-06 DIAGNOSIS — R300 Dysuria: Secondary | ICD-10-CM | POA: Diagnosis not present

## 2016-10-14 ENCOUNTER — Ambulatory Visit: Payer: Medicare Other | Admitting: Gastroenterology

## 2016-11-11 ENCOUNTER — Ambulatory Visit: Payer: Medicare Other | Admitting: Gastroenterology

## 2016-11-22 ENCOUNTER — Ambulatory Visit (INDEPENDENT_AMBULATORY_CARE_PROVIDER_SITE_OTHER): Payer: Medicare Other | Admitting: Psychiatry

## 2016-11-22 ENCOUNTER — Encounter (HOSPITAL_COMMUNITY): Payer: Self-pay | Admitting: Psychiatry

## 2016-11-22 DIAGNOSIS — Z811 Family history of alcohol abuse and dependence: Secondary | ICD-10-CM

## 2016-11-22 DIAGNOSIS — F250 Schizoaffective disorder, bipolar type: Secondary | ICD-10-CM | POA: Diagnosis not present

## 2016-11-22 DIAGNOSIS — Z79899 Other long term (current) drug therapy: Secondary | ICD-10-CM | POA: Diagnosis not present

## 2016-11-22 DIAGNOSIS — F909 Attention-deficit hyperactivity disorder, unspecified type: Secondary | ICD-10-CM

## 2016-11-22 DIAGNOSIS — F419 Anxiety disorder, unspecified: Secondary | ICD-10-CM

## 2016-11-22 DIAGNOSIS — Z87891 Personal history of nicotine dependence: Secondary | ICD-10-CM | POA: Diagnosis not present

## 2016-11-22 DIAGNOSIS — F251 Schizoaffective disorder, depressive type: Secondary | ICD-10-CM

## 2016-11-22 MED ORDER — METHYLPHENIDATE HCL 10 MG PO TABS: 10.0000 mg | ORAL_TABLET | Freq: Every day | ORAL | 0 refills | Status: DC

## 2016-11-22 MED ORDER — ESCITALOPRAM OXALATE 10 MG PO TABS: 10.0000 mg | ORAL_TABLET | Freq: Every day | ORAL | 0 refills | Status: DC

## 2016-11-22 MED ORDER — LITHIUM CARBONATE 300 MG PO TABS: ORAL_TABLET | ORAL | 2 refills | Status: DC

## 2016-11-22 MED ORDER — HALOPERIDOL 2 MG PO TABS: ORAL_TABLET | ORAL | 0 refills | Status: DC

## 2016-11-22 MED ORDER — BENZTROPINE MESYLATE 0.5 MG PO TABS: 0.5000 mg | ORAL_TABLET | Freq: Every day | ORAL | 0 refills | Status: DC

## 2016-11-22 MED ORDER — TRAZODONE HCL 100 MG PO TABS: ORAL_TABLET | ORAL | 0 refills | Status: DC

## 2016-11-22 MED ORDER — ALPRAZOLAM 0.5 MG PO TABS: 0.5000 mg | ORAL_TABLET | Freq: Every day | ORAL | 0 refills | Status: DC

## 2016-11-22 NOTE — Progress Notes (Signed)
[facility] MD/PA/NP OP Progress Note  11/22/2016 10:17 AM Ruth Duncan  MRN:  [id number]  Chief Complaint: I have too much going on.  My stepfather has having prostate surgery.  My son's girlfriend is again pregnant.  HPI: Ruth Duncan came for her follow-up appointment.  She is compliant with medication.  She endorses too much going on in her life.  Her son's girlfriend is again pregnant and due next July.  She was shocked when she hear the news.  She is keeping her grandbaby to 3 times a week.  She is happy about it but she is not sure how she will handle another grandchild.  Recently there was an incident that her grandbaby was staying at her other grandma and she was taken to the emergency room because of bruise and now DSS involved.  She was very upset with the incident.  Likely granddaughter was not hurt.  She is also concerned about her stepfather who recently had prostate surgery and now staying in the hospital.  Despite somewhat stress she is feeling better.  She believes increase with him help her mood irritability and anger.  Some nights she has sleeping problem but overall she described medicine working.  She is taking Xanax only as needed.  She endorsed Ritalin helping her ADD symptoms.  She is able to do multitasking.  She excited about her Thanksgiving and holidays.  She also endorsed her other twin son may be gay and she worried about him.  She is hoping that son see some therapist.  Patient denies any crying spells, angry episodes, hallucination, paranoia or any suicidal thoughts.  She believed Haldol helping her hallucination.  Her appetite is okay.  Her energy level is good.  Her vital signs are stable.  Patient denies drinking alcohol or using any illegal substances.  Visit Diagnosis:    ICD-10-CM   1. Schizoaffective disorder, depressive type ([place]) F25.1 traZODone (DESYREL) 100 MG tablet    lithium 300 MG tablet  2. Schizoaffective disorder, bipolar type (HCC) F25.0 haloperidol (HALDOL) 2 MG  tablet    escitalopram (LEXAPRO) 10 MG tablet    benztropine (COGENTIN) 0.5 MG tablet    ALPRAZolam (XANAX) 0.5 MG tablet    methylphenidate (RITALIN) 10 MG tablet    DISCONTINUED: methylphenidate (RITALIN) 10 MG tablet    DISCONTINUED: methylphenidate (RITALIN) 10 MG tablet    Past Psychiatric History: Reviewed.  Past Medical History:  Past Medical History:  Diagnosis Date  . Bipolar disorder ([place])   . Chronic abdominal pain    hx of IBS-D, ? bile salt-induced diarrhea  . Chronic eczema of hand 2012   BILATERAL ON MTX SINCE 2014  . Complication of anesthesia   . Diverticulitis   . Ovarian cancer ([place]) 2010  . Surgical menopause 2010  . Tubular adenoma 2010    Past Surgical History:  Procedure Laterality Date  . ABDOMINAL HYSTERECTOMY  2010  . APPENDECTOMY  2004  . BACTERIAL OVERGROWTH TEST N/A 09/01/2015   Performed by Danie Binder, MD at [facility]  . CESAREAN SECTION  1998  . COLONOSCOPY  12/2008   tubular adenomas, negative microscopic colitis. Due for Flex Sig Dec 2015  . ESOPHAGOGASTRODUODENOSCOPY  05/06/2008   [id number] esophagus without evidence of Barrett's, mass, erosion, ulceration or stricture/ Hiatal hernia/ Normal duodenal bulb and second portion of the duodenum  . ESOPHAGOGASTRODUODENOSCOPY (EGD) N/A 10/04/2014   Performed by Danie Binder, MD at [facility]  . FLEXIBLE SIGMOIDOSCOPY N/A 02/18/2014  Performed by Danie Binder, MD at [facility]  . SUBTOTAL COLECTOMY  2011  . TUBAL LIGATION      Family Psychiatric History: Reviewed.  Family History:  Family History  Problem Relation Age of Onset  . Alcohol abuse Paternal Grandfather   . Hypertension Mother   . Kidney disease Father   . Hypertension Father   . Coronary artery disease Father   . Colon cancer Neg Hx     Social History:  Social History   Socioeconomic History  . Marital status: Married    Spouse name: None  . Number of children: None  . Years of education: None   . Highest education level: None  Social Needs  . Financial resource strain: None  . Food insecurity - worry: None  . Food insecurity - inability: None  . Transportation needs - medical: None  . Transportation needs - non-medical: None  Occupational History  . Occupation: Disability    Comment: since 2010  Tobacco Use  . Smoking status: Former Smoker    Packs/day: 1.00    Years: 30.00    Pack years: 30.00    Last attempt to quit: 11/04/2012    Years since quitting: 4.0  . Smokeless tobacco: Never Used  Substance and Sexual Activity  . Alcohol use: No    Alcohol/week: 0.0 oz  . Drug use: No  . Sexual activity: Yes    Partners: Male    Birth control/protection: None  Other Topics Concern  . None  Social History Narrative   Kaleeya was born and grew up in [place], [place]. Her parents divorced when she was 71 years of age, and she grew up with her paternal grandmother and grandfather. Her grandfather was an alcoholic, and Christinia found him dead of an MI at the age of 31. Her mother used to lock her in a dark bathroom for an hour or so very frequently, and Latonyia continues to have phobias associated with opening a bathroom door. Teria graduated from [employer], but has had no real career because she was unable to hold a job. She has been married for 32 years and has 3 boys, twins age 18 and her oldest son age 20. 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 She is currently attending Alcoholics Anonymous meetings 3 times a week, has a sponsor, and is working the 12 steps.    Allergies: No Known Allergies  Metabolic Disorder Labs: Lab Results  Component Value Date   HGBA1C 5.2 05/17/2016   MPG 103 05/17/2016   MPG 117 (H) 04/17/2013   No results found for: PROLACTIN No results found for: CHOL, TRIG, HDL, CHOLHDL, VLDL, LDLCALC Lab Results  Component Value Date   TSH 1.755 04/17/2013    Therapeutic Level Labs: Lab Results  Component  Value Date   LITHIUM <0.3 (L) 09/13/2016   LITHIUM <0.1 (L) 05/17/2016   No results found for: VALPROATE No components found for:  CBMZ  Current Medications: Current Outpatient Medications  Medication Sig Dispense Refill  . ALPRAZolam (XANAX) 0.5 MG tablet Take 1 tablet (0.5 mg total) by mouth at bedtime. 20 tablet 0  . benztropine (COGENTIN) 0.5 MG tablet Take 1 tablet (0.5 mg total) by mouth at bedtime. 90 tablet 0  . escitalopram (LEXAPRO) 10 MG tablet Take 1 tablet (10 mg total) by mouth daily. 90 tablet 0  . haloperidol (HALDOL) 2 MG tablet Take 1 tab in am and 4  at bed time 450 tablet 0  . lidocaine (XYLOCAINE) 2 % solution 2 TSP  PO 30 MINS PRIOR TO MEALS OR AT BEDTIME PRN FOR CHEST PAIN. REPEAT DOSE EVERY 4 HOURS. NO MORE THAN 8 DOSES A DAY. 300 mL 1  . lithium 300 MG tablet Take 2 tab in am, 1 at noon and 2 tab in evening 150 tablet 1  . methylphenidate (RITALIN) 10 MG tablet Take 1 tablet (10 mg total) by mouth daily. 30 tablet 0  . nitrofurantoin (MACRODANTIN) 50 MG capsule Take 50 mg by mouth at bedtime.   5  . ondansetron (ZOFRAN) 4 MG tablet Take 1 tablet (4 mg total) by mouth every 8 (eight) hours as needed for nausea or vomiting. 90 tablet 3  . pantoprazole (PROTONIX) 40 MG tablet 1 PO 30 MINS BEFORE BREAKFAST AND SUPPER 60 tablet 11  . promethazine (PHENERGAN) 25 MG tablet Take 1 tablet (25 mg total) by mouth every 6 (six) hours as needed for nausea or vomiting. 40 tablet 1  . traZODone (DESYREL) 100 MG tablet TAKE 1 TAB AT BEDTIME 90 tablet 0  . valACYclovir (VALTREX) 500 MG tablet Take 500 mg by mouth once a week.      No current facility-administered medications for this visit.      Musculoskeletal: Strength & Muscle Tone: within normal limits Gait & Station: normal Patient leans: N/A  Psychiatric Specialty Exam: ROS  Blood pressure 110/64, pulse 62, height 5\' 3"  (1.6 m), weight 122 lb (55.3 kg).Body mass index is 21.61 kg/m.  General Appearance: Casual  Eye  Contact:  Good  Speech:  Clear and Coherent and Fast  Volume:  Normal  Mood:  Anxious  Affect:  Labile  Thought Process:  Goal Directed  Orientation:  Full (Time, Place, and Person)  Thought Content: Rumination   Suicidal Thoughts:  No  Homicidal Thoughts:  No  Memory:  Immediate;   Good Recent;   Good Remote;   Good  Judgement:  Good  Insight:  Good  Psychomotor Activity:  Slightly increased  Concentration:  Concentration: Fair and Attention Span: Fair  Recall:  Good  Fund of Knowledge: Good  Language: Good  Akathisia:  No  Handed:  Right  AIMS (if indicated): not done  Assets:  Communication Skills Desire for Improvement Housing Resilience Social Support Transportation  ADL's:  Intact  Cognition: WNL  Sleep:  Good   Screenings:   Assessment and Plan: Schizoaffective disorder bipolar type.  ADHD.  Anxiety disorder NOS.  Despite increased psychosocial stressors she is doing better.  She is tolerating her medication and denies any side effects.  She has no tremor or shakes or any EPS.  She feels increased lithium to help her mood.  One more time I encouraged to see a therapist but patient declined.  I provided therapist named at [facility] who can help her son.  Reassurance given.  I will continue lithium 600 mg in the morning, 300 mg in the afternoon and 600 mg at bedtime.  We will repeat lithium level on her next appointment.  Continue Cogentin 0.5 mg at bedtime, trazodone 100 mg at bedtime, Haldol 2 mg in the morning and 4 mg at bedtime, Ritalin 10 mg daily and she will continue Xanax 0.5 mg as needed.  I provided 20 tablets which she used only for severe anxiety.  Discussed medication side effects and benefits.  Recommended to call us back if she has any question, concern or if she feels worsening  of the symptoms.  Follow-up in 3 months.   Kathlee Nations, MD 11/22/2016, 10:17 AM

## 2016-12-20 ENCOUNTER — Other Ambulatory Visit (HOSPITAL_COMMUNITY): Payer: Self-pay

## 2016-12-20 DIAGNOSIS — F250 Schizoaffective disorder, bipolar type: Secondary | ICD-10-CM

## 2016-12-20 MED ORDER — HALOPERIDOL 5 MG PO TABS: ORAL_TABLET | ORAL | 0 refills | Status: DC

## 2016-12-22 ENCOUNTER — Ambulatory Visit: Payer: Medicare Other | Admitting: Gastroenterology

## 2017-01-05 ENCOUNTER — Other Ambulatory Visit: Payer: Self-pay | Admitting: Family Medicine

## 2017-01-05 DIAGNOSIS — Z1231 Encounter for screening mammogram for malignant neoplasm of breast: Secondary | ICD-10-CM

## 2017-01-31 ENCOUNTER — Ambulatory Visit: Payer: Self-pay

## 2017-02-09 ENCOUNTER — Encounter: Payer: Self-pay | Admitting: Gastroenterology

## 2017-02-09 ENCOUNTER — Ambulatory Visit (INDEPENDENT_AMBULATORY_CARE_PROVIDER_SITE_OTHER): Payer: Medicare Other | Admitting: Gastroenterology

## 2017-02-09 DIAGNOSIS — K219 Gastro-esophageal reflux disease without esophagitis: Secondary | ICD-10-CM

## 2017-02-09 DIAGNOSIS — K580 Irritable bowel syndrome with diarrhea: Secondary | ICD-10-CM

## 2017-02-09 MED ORDER — ONDANSETRON HCL 4 MG PO TABS: 4.0000 mg | ORAL_TABLET | Freq: Three times a day (TID) | ORAL | 4 refills | Status: DC | PRN

## 2017-02-09 NOTE — Patient Instructions (Addendum)
DRINK WATER TO KEEP YOUR URINE LIGHT YELLOW.  AVOID REFLUX TRIGGERS. SEE INFO BELOW.  TAKE IMODIUM AT BEDTIME.  CONTINUE PROTONIX. TAKE 30 MINUTES PRIOR TO MEALS TWICE DAILY.  TAKE IBGARD 2 DAILY WITH 8 OZ OF WATER.  Please CALL IN ONE MONTH IF SYMPTOMS ARE NOT IMPROVED.   FOLLOW UP IN 6 MOS.      Lifestyle and home remedies TO CONTROL HEARTBURN You may eliminate or reduce the frequency of heartburn by making the following lifestyle changes:  . Control your weight. Being overweight is a major risk factor for heartburn and GERD. Excess pounds put pressure on your abdomen, pushing up your stomach and causing acid to back up into your esophagus.   . Eat smaller meals. 4 TO 6 MEALS A DAY. This reduces pressure on the lower esophageal sphincter, helping to prevent the valve from opening and acid from washing back into your esophagus.   Dolphus Jenny your belt. Clothes that fit tightly around your waist put pressure on your abdomen and the lower esophageal sphincter.   . Eliminate heartburn triggers. Everyone has specific triggers. Common triggers such as fatty or fried foods, spicy food, tomato sauce, carbonated beverages, alcohol, chocolate, mint, garlic, onion, caffeine and nicotine may make heartburn worse.   [name] Avoid stooping or bending. Tying your shoes is OK. Bending over for longer periods to weed your garden isn't, especially soon after eating.   . Don't lie down after a meal. Wait at least three to four hours after eating before going to bed, and don't lie down right after eating.   Alternative medicine . Several home remedies exist for treating GERD, but they provide only temporary relief. They include drinking baking soda (sodium bicarbonate) added to water or drinking other fluids such as baking soda mixed with cream of tartar and water. . Although these liquids create temporary relief by neutralizing, washing away or buffering acids, eventually they aggravate the situation by  adding gas and fluid to your stomach, increasing pressure and causing more acid reflux. Further, adding more sodium to your diet may increase your blood pressure and add stress to your heart, and excessive bicarbonate ingestion can alter the acid-base balance in your body.    [name]

## 2017-02-09 NOTE — Assessment & Plan Note (Signed)
SYMPTOMS NOT IDEALLY CONTROLLED AND MAY BE DUE TO LIFESTYLE FACTORS.  DRINK WATER TO KEEP YOUR URINE LIGHT YELLOW. AVOID REFLUX TRIGGERS.  HANDOUT GIVEN. CONTINUE PROTONIX. TAKE 30 MINUTES PRIOR TO MEALS TWICE DAILY. FOLLOW UP IN 6 MOS.

## 2017-02-09 NOTE — Progress Notes (Signed)
Subjective:    Patient ID: Ruth Duncan, female    DOB: 07/14/1970, 47 y.o.   MRN: [id number] Aletha Halim., PA-C   HPI Just had granddaughter(6 mos old). HAVING A LOT OF GAS AND DIARRHEA FOR PAST 1.5 MOS. NOT EATING GASSY FOODS. NO MILK, CHEESE, OR ICE CREAM. EATS OUT: 3 TIMES A WEEK. HAS BEEN MORE. RARE HAS NL FORMED STOOL. WATERY AND EXPLOSIVE BLACK(NO PEPTO BISMOL OR KAOPECTATE.) MAY HAVE GREASY FILM. DOESN'T TURN WATER RED. GRILLED CHICKEN AND A SALAD(LETTUCE: ICEBERG). EATING ABOUT THE SAME THIN EVERY DAY JUST ABOUT. DRESSING BALSAMIC. BMs: 4-6. UP AT NIGHT HAVING: 4/7 NIGHTS. NEVER TRIED IMODIUM. SCARED TO TAKE ANYTHING. RATHER HAVE DIARRHEA THAN BE STOPPED UP. LAID OFF COFFEE. NO EATING GREASY FOODS. DRINKING WATER. SODA: ONE DIET IN AM(CAFFEIINE). VEGGIES/FRUITS: CUCUMBERS, ORANGES, BANANAS, CELERY, CARROTS. NEVER TRIED IBGARD. NAUSEA: 3 DAYS A WEEK AND VOMITS 2X/WEEK(ESPECIALLY AT NIGHT,NO BLOOD). HEARTBURN: EVERY DAY AND EVEN WITH PROTONIX.  PT DENIES FEVER, CHILLS, HEMATOCHEZIA, HEMATEMESIS, melena, CHEST PAIN, SHORTNESS OF BREATH,  CHANGE IN BOWEL IN HABITS, constipation, abdominal pain, problems swallowing, problems with sedation, heartburn or indigestion.  Past Medical History:  Diagnosis Date  . Bipolar disorder ([place])   . Chronic abdominal pain    hx of IBS-D, ? bile salt-induced diarrhea  . Chronic eczema of hand 2012   BILATERAL ON MTX SINCE 2014  . Complication of anesthesia   . Diverticulitis   . Ovarian cancer ([place]) 2010  . Surgical menopause 2010  . Tubular adenoma 2010   Past Surgical History:  Procedure Laterality Date  . ABDOMINAL HYSTERECTOMY  2010  . APPENDECTOMY  2004  . BACTERIAL OVERGROWTH TEST N/A 09/01/2015   Procedure: BACTERIAL OVERGROWTH TEST;  Surgeon: Danie Binder, MD;  Location: [facility];  Service: Endoscopy;  Laterality: N/A;  700  . CESAREAN SECTION  1998  . COLONOSCOPY  12/2008   tubular adenomas, negative microscopic colitis. Due  for Flex Sig Dec 2015  . ESOPHAGOGASTRODUODENOSCOPY  05/06/2008   YIR:SWNIOE esophagus without evidence of Barrett's, mass, erosion, ulceration or stricture/ Hiatal hernia/ Normal duodenal bulb and second portion of the duodenum  . ESOPHAGOGASTRODUODENOSCOPY N/A 10/04/2014   Dr. Oneida Alar: patent stricture at GE junction, no dilation, small hiatal hernia, mild non-erosive gastritis (benign), multiple small gastric polyps, benign. Capsule study recommended but never completed   . FLEXIBLE SIGMOIDOSCOPY N/A 02/18/2014   SLF: Normal small bowel. formed stool at anastomosis. Stool cleared with irrigation. One superfical erosion at the anastomosis. otherwise normal. normal rectal mucosa  . SUBTOTAL COLECTOMY  2011  . TUBAL LIGATION      No Known Allergies  Current Outpatient Medications  Medication Sig Dispense Refill  . ALPRAZolam (XANAX) 0.5 MG tablet Take 1 tablet (0.5 mg total) at bedtime by mouth. (Patient taking differently: Take 0.5 mg by mouth at bedtime as needed. )    . benztropine (COGENTIN) 0.5 MG tablet Take 1 tablet (0.5 mg total) at bedtime by mouth.    . escitalopram (LEXAPRO) 10 MG tablet Take 1 tablet (10 mg total) daily by mouth.    . haloperidol (HALDOL) 5 MG tablet Take 1/2 tab (2.5 mg) qam and 1 1/2 tabs (7.5 mg) qhs    . lithium 300 MG tablet Take 2 tab in am, 1 at noon and 2 tab in evening    . methylphenidate (RITALIN) 10 MG tablet Take 1 tablet (10 mg total) daily by mouth.    . nitrofurantoin (MACRODANTIN) 50 MG capsule Take  50 mg by mouth as needed.     . pantoprazole (PROTONIX) 40 MG tablet 1 PO 30 MINS BEFORE BREAKFAST AND SUPPER    . promethazine (PHENERGAN) 25 MG tablet Take 1 tablet (25 mg total) by mouth every 6 (six) hours as needed for nausea or vomiting.    . traZODone (DESYREL) 100 MG tablet TAKE 1 TAB AT BEDTIME    . valACYclovir (VALTREX) 500 MG tablet Take 500 mg by mouth once a week.      Review of Systems PER HPI OTHERWISE ALL SYSTEMS ARE NEGATIVE.      Objective:   Physical Exam  Constitutional: She is oriented to person, place, and time. She appears well-developed and well-nourished. No distress.  HENT:  Head: Normocephalic and atraumatic.  Mouth/Throat: Oropharynx is clear and moist. No oropharyngeal exudate.  Eyes: Pupils are equal, round, and reactive to light. No scleral icterus.  Neck: Normal range of motion. Neck supple.  Cardiovascular: Normal rate, regular rhythm and normal heart sounds.  Pulmonary/Chest: Effort normal and breath sounds normal. No respiratory distress.  Abdominal: Soft. Bowel sounds are normal. She exhibits no distension. There is no tenderness.  Musculoskeletal: She exhibits no edema.  Lymphadenopathy:    She has no cervical adenopathy.  Neurological: She is alert and oriented to person, place, and time.  NO FOCAL DEFICITS  Psychiatric: She has a normal mood and affect.  Vitals reviewed.         Assessment & Plan:

## 2017-02-09 NOTE — Assessment & Plan Note (Addendum)
SYMPTOMS NOT IDEALLY CONTROLLED.  DRINK WATER TO KEEP YOUR URINE LIGHT YELLOW. TAKE IMODIUM AT BEDTIME. TAKE IBGARD 2 DAILY WITH 8 OZ OF WATER. REFILL ZOFRAN. FOLLOW UP IN 6 MOS.

## 2017-02-14 ENCOUNTER — Other Ambulatory Visit: Payer: Self-pay | Admitting: Gastroenterology

## 2017-02-21 ENCOUNTER — Ambulatory Visit (INDEPENDENT_AMBULATORY_CARE_PROVIDER_SITE_OTHER): Payer: Medicare Other | Admitting: Psychiatry

## 2017-02-21 ENCOUNTER — Encounter (HOSPITAL_COMMUNITY): Payer: Self-pay | Admitting: Psychiatry

## 2017-02-21 ENCOUNTER — Ambulatory Visit
Admission: RE | Admit: 2017-02-21 | Discharge: 2017-02-21 | Disposition: A | Discharge status: Home / Self Care | Payer: Medicare Other | Source: Ambulatory Visit | Attending: Family Medicine | Admitting: Family Medicine

## 2017-02-21 VITALS — BP 100/67 | HR 60 | Ht 63.0 in | Wt 121.0 lb

## 2017-02-21 DIAGNOSIS — F419 Anxiety disorder, unspecified: Secondary | ICD-10-CM

## 2017-02-21 DIAGNOSIS — F900 Attention-deficit hyperactivity disorder, predominantly inattentive type: Secondary | ICD-10-CM | POA: Diagnosis not present

## 2017-02-21 DIAGNOSIS — Z87891 Personal history of nicotine dependence: Secondary | ICD-10-CM | POA: Diagnosis not present

## 2017-02-21 DIAGNOSIS — Z1231 Encounter for screening mammogram for malignant neoplasm of breast: Secondary | ICD-10-CM | POA: Diagnosis not present

## 2017-02-21 DIAGNOSIS — Z736 Limitation of activities due to disability: Secondary | ICD-10-CM | POA: Diagnosis not present

## 2017-02-21 DIAGNOSIS — F251 Schizoaffective disorder, depressive type: Secondary | ICD-10-CM

## 2017-02-21 DIAGNOSIS — Z79899 Other long term (current) drug therapy: Secondary | ICD-10-CM | POA: Diagnosis not present

## 2017-02-21 DIAGNOSIS — Z811 Family history of alcohol abuse and dependence: Secondary | ICD-10-CM

## 2017-02-21 DIAGNOSIS — R51 Headache: Secondary | ICD-10-CM | POA: Diagnosis not present

## 2017-02-21 DIAGNOSIS — F250 Schizoaffective disorder, bipolar type: Secondary | ICD-10-CM

## 2017-02-21 MED ORDER — METHYLPHENIDATE HCL 10 MG PO TABS: 10.0000 mg | ORAL_TABLET | Freq: Every day | ORAL | 0 refills | Status: DC

## 2017-02-21 MED ORDER — BENZTROPINE MESYLATE 0.5 MG PO TABS: 0.5000 mg | ORAL_TABLET | Freq: Every day | ORAL | 0 refills | Status: DC

## 2017-02-21 MED ORDER — ALPRAZOLAM 0.5 MG PO TABS: 0.5000 mg | ORAL_TABLET | Freq: Every evening | ORAL | 2 refills | Status: DC | PRN

## 2017-02-21 MED ORDER — TRAZODONE HCL 100 MG PO TABS: ORAL_TABLET | ORAL | 0 refills | Status: DC

## 2017-02-21 MED ORDER — TOPIRAMATE 25 MG PO TABS: 25.0000 mg | ORAL_TABLET | Freq: Every day | ORAL | 0 refills | Status: DC

## 2017-02-21 MED ORDER — HALOPERIDOL 5 MG PO TABS: ORAL_TABLET | ORAL | 0 refills | Status: DC

## 2017-02-21 MED ORDER — LITHIUM CARBONATE 300 MG PO TABS: ORAL_TABLET | ORAL | 2 refills | Status: DC

## 2017-02-21 MED ORDER — ESCITALOPRAM OXALATE 10 MG PO TABS: 10.0000 mg | ORAL_TABLET | Freq: Every day | ORAL | 0 refills | Status: DC
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
## 2017-02-23 LAB — LITHIUM LEVEL: Lithium Lvl: <0.1 mmol/L — ABNORMAL LOW (ref 0.6–1.2)

## 2017-02-23 LAB — HEMOGLOBIN A1C
Est. average glucose Bld gHb Est-mCnc: 103 mg/dL
Hgb A1c MFr Bld: 5.2 % (ref 4.8–5.6)

## 2017-03-21 ENCOUNTER — Other Ambulatory Visit (HOSPITAL_COMMUNITY): Payer: Self-pay | Admitting: Psychiatry

## 2017-03-21 MED ORDER — TOPIRAMATE 25 MG PO TABS: 25.0000 mg | ORAL_TABLET | Freq: Every day | ORAL | 1 refills | Status: DC

## 2017-03-26 ENCOUNTER — Other Ambulatory Visit (HOSPITAL_COMMUNITY): Payer: Self-pay

## 2017-03-26 DIAGNOSIS — F251 Schizoaffective disorder, depressive type: Secondary | ICD-10-CM

## 2017-03-26 MED ORDER — LITHIUM CARBONATE 300 MG PO TABS
ORAL_TABLET | ORAL | 0 refills | Status: DC
Start: 2017-03-26 — End: 2017-05-23

## 2017-04-12 ENCOUNTER — Other Ambulatory Visit (HOSPITAL_COMMUNITY): Payer: Self-pay | Admitting: Psychiatry

## 2017-04-16 ENCOUNTER — Other Ambulatory Visit (HOSPITAL_COMMUNITY): Payer: Self-pay | Admitting: Psychiatry

## 2017-04-16 DIAGNOSIS — F251 Schizoaffective disorder, depressive type: Secondary | ICD-10-CM

## 2017-05-02 DIAGNOSIS — N390 Urinary tract infection, site not specified: Secondary | ICD-10-CM | POA: Diagnosis not present

## 2017-05-02 DIAGNOSIS — L729 Follicular cyst of the skin and subcutaneous tissue, unspecified: Secondary | ICD-10-CM | POA: Diagnosis not present

## 2017-05-02 DIAGNOSIS — L089 Local infection of the skin and subcutaneous tissue, unspecified: Secondary | ICD-10-CM | POA: Diagnosis not present

## 2017-05-19 ENCOUNTER — Other Ambulatory Visit (HOSPITAL_COMMUNITY): Payer: Self-pay | Admitting: Psychiatry

## 2017-05-23 ENCOUNTER — Ambulatory Visit (INDEPENDENT_AMBULATORY_CARE_PROVIDER_SITE_OTHER): Payer: Medicare Other | Admitting: Psychiatry

## 2017-05-23 ENCOUNTER — Encounter (HOSPITAL_COMMUNITY): Payer: Self-pay | Admitting: Psychiatry

## 2017-05-23 DIAGNOSIS — R51 Headache: Secondary | ICD-10-CM | POA: Diagnosis not present

## 2017-05-23 DIAGNOSIS — Z811 Family history of alcohol abuse and dependence: Secondary | ICD-10-CM | POA: Diagnosis not present

## 2017-05-23 DIAGNOSIS — F251 Schizoaffective disorder, depressive type: Secondary | ICD-10-CM

## 2017-05-23 DIAGNOSIS — F250 Schizoaffective disorder, bipolar type: Secondary | ICD-10-CM | POA: Diagnosis not present

## 2017-05-23 DIAGNOSIS — F909 Attention-deficit hyperactivity disorder, unspecified type: Secondary | ICD-10-CM | POA: Diagnosis not present

## 2017-05-23 DIAGNOSIS — Z87891 Personal history of nicotine dependence: Secondary | ICD-10-CM | POA: Diagnosis not present

## 2017-05-23 DIAGNOSIS — F419 Anxiety disorder, unspecified: Secondary | ICD-10-CM

## 2017-05-23 MED ORDER — METHYLPHENIDATE HCL 10 MG PO TABS: 10.0000 mg | ORAL_TABLET | Freq: Every day | ORAL | 0 refills | Status: DC

## 2017-05-23 MED ORDER — BENZTROPINE MESYLATE 0.5 MG PO TABS: 0.5000 mg | ORAL_TABLET | Freq: Every day | ORAL | 0 refills | Status: DC

## 2017-05-23 MED ORDER — ESCITALOPRAM OXALATE 10 MG PO TABS: 10.0000 mg | ORAL_TABLET | Freq: Every day | ORAL | 0 refills | Status: DC

## 2017-05-23 MED ORDER — LITHIUM CARBONATE 300 MG PO TABS: ORAL_TABLET | ORAL | 0 refills | Status: DC

## 2017-05-23 MED ORDER — HALOPERIDOL 5 MG PO TABS: ORAL_TABLET | ORAL | 0 refills | Status: DC

## 2017-05-23 MED ORDER — ALPRAZOLAM 0.5 MG PO TABS: 0.5000 mg | ORAL_TABLET | Freq: Every evening | ORAL | 2 refills | Status: DC | PRN

## 2017-05-23 MED ORDER — TRAZODONE HCL 100 MG PO TABS: ORAL_TABLET | ORAL | 0 refills | Status: DC

## 2017-05-23 NOTE — Progress Notes (Signed)
[facility] MD/PA/NP OP Progress Note  05/23/2017 12:15 PM Ruth Duncan  MRN:  [id number]  Chief Complaint: I am doing better.  Topamax helping my headaches.  HPI: Ruth Duncan came for her follow-up appointment.  She is compliant with medication.  On her last visit we give Topamax for headaches and she tried few times which helped her.  Her family situation is much better.  She is excited now her son's girlfriend is pregnant and due in first week of July.  This is her second grandchild.  Patient is getting along very well with her son's girlfriend.  Lately she is been very busy taking care of her stepfather who diagnosed with cancer.  Patient will bring her stepfather for next 10 days for radiation treatment.  She had blood work.  Her lithium level is less than 0.1.  Patient is taking lithium as prescribed.  It is unclear why her level is subtherapeutic.  She feel it is working well because she is not as irritable, angry and her hallucinations are not as bad and intense.  She is sleeping good with Xanax.  She has no tremors shakes or any EPS.  Her energy level is good.  Patient denies drinking or using any illegal substances.  She is excited because she is going to [place] for 10 days vacation with her husband and best friends couple.  Visit Diagnosis:    ICD-10-CM   1. Schizoaffective disorder, bipolar type ([place]) F25.0 methylphenidate (RITALIN) 10 MG tablet    haloperidol (HALDOL) 5 MG tablet    escitalopram (LEXAPRO) 10 MG tablet    benztropine (COGENTIN) 0.5 MG tablet    ALPRAZolam (XANAX) 0.5 MG tablet  2. Schizoaffective disorder, depressive type ([place]) F25.1 traZODone (DESYREL) 100 MG tablet    lithium 300 MG tablet    Past Psychiatric History: Reviewed.  Past Medical History:  Past Medical History:  Diagnosis Date  . Bipolar disorder ([place])   . Chronic abdominal pain    hx of IBS-D, ? bile salt-induced diarrhea  . Chronic eczema of hand 2012   BILATERAL ON MTX SINCE 2014  . Complication of  anesthesia   . Diverticulitis   . Ovarian cancer ([place]) 2010  . Surgical menopause 2010  . Tubular adenoma 2010    Past Surgical History:  Procedure Laterality Date  . ABDOMINAL HYSTERECTOMY  2010  . APPENDECTOMY  2004  . BACTERIAL OVERGROWTH TEST N/A 09/01/2015   Procedure: BACTERIAL OVERGROWTH TEST;  Surgeon: Danie Binder, MD;  Location: [facility];  Service: Endoscopy;  Laterality: N/A;  700  . CESAREAN SECTION  1998  . COLONOSCOPY  12/2008   tubular adenomas, negative microscopic colitis. Due for Flex Sig Dec 2015  . ESOPHAGOGASTRODUODENOSCOPY  05/06/2008   DPO:[id number] esophagus without evidence of Barrett's, mass, erosion, ulceration or stricture/ Hiatal hernia/ Normal duodenal bulb and second portion of the duodenum  . ESOPHAGOGASTRODUODENOSCOPY N/A 10/04/2014   Dr. Oneida Alar: patent stricture at GE junction, no dilation, small hiatal hernia, mild non-erosive gastritis (benign), multiple small gastric polyps, benign. Capsule study recommended but never completed   . FLEXIBLE SIGMOIDOSCOPY N/A 02/18/2014   SLF: Normal small bowel. formed stool at anastomosis. Stool cleared with irrigation. One superfical erosion at the anastomosis. otherwise normal. normal rectal mucosa  . SUBTOTAL COLECTOMY  2011  . TUBAL LIGATION      Family Psychiatric History: Reviewed.  Family History:  Family History  Problem Relation Age of Onset  . Alcohol abuse Paternal Grandfather   . Hypertension  Mother   . Kidney disease Father   . Hypertension Father   . Coronary artery disease Father   . Breast cancer Paternal Grandmother        post menopausal  . Colon cancer Neg Hx     Social History:  Social History   Socioeconomic History  . Marital status: Married    Spouse name: Not on file  . Number of children: Not on file  . Years of education: Not on file  . Highest education level: Not on file  Occupational History  . Occupation: Disability    Comment: since 2010  Social Needs  .  Financial resource strain: Not on file  . Food insecurity:    Worry: Not on file    Inability: Not on file  . Transportation needs:    Medical: Not on file    Non-medical: Not on file  Tobacco Use  . Smoking status: Former Smoker    Packs/day: 1.00    Years: 30.00    Pack years: 30.00    Last attempt to quit: 11/04/2012    Years since quitting: 4.5  . Smokeless tobacco: Never Used  Substance and Sexual Activity  . Alcohol use: No    Alcohol/week: 0.0 oz  . Drug use: No  . Sexual activity: Yes    Partners: Male    Birth control/protection: None  Lifestyle  . Physical activity:    Days per week: Not on file    Minutes per session: Not on file  . Stress: Not on file  Relationships  . Social connections:    Talks on phone: Not on file    Gets together: Not on file    Attends religious service: Not on file    Active member of club or organization: Not on file    Attends meetings of clubs or organizations: Not on file    Relationship status: Not on file  Other Topics Concern  . Not on file  Social History Narrative   Lavon was born and grew up in [place], [place]. Her parents divorced when she was 83 years of age, and she grew up with her paternal grandmother and grandfather. Her grandfather was an alcoholic, and Nahdia found him dead of an MI at the age of 38. Her mother used to lock her in a dark bathroom for an hour or so very frequently, and Noga continues to have phobias associated with opening a bathroom door. Tonishia graduated from [employer], but has had no real career because she was unable to hold a job. She has been married for 48 years and has 3 boys, twins age 84 and her oldest son age 69. 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 She is currently attending Alcoholics Anonymous meetings 3 times a week, has a sponsor, and is working the 12 steps.    Allergies: No Known Allergies  Metabolic Disorder Labs: Lab Results   Component Value Date   HGBA1C 5.2 02/21/2017   MPG 103 05/17/2016   MPG 117 (H) 04/17/2013   No results found for: PROLACTIN No results found for: CHOL, TRIG, HDL, CHOLHDL, VLDL, LDLCALC Lab Results  Component Value Date   TSH 1.755 04/17/2013    Therapeutic Level Labs: Lab Results  Component Value Date   LITHIUM <0.1 (L) 02/21/2017   LITHIUM <0.3 (L) 09/13/2016   No results found for: VALPROATE No components found for:  CBMZ  Current Medications: Current Outpatient Medications  Medication Sig Dispense Refill  . ALPRAZolam (XANAX) 0.5 MG tablet Take 1 tablet (0.5 mg total) by mouth at bedtime as needed. 30 tablet 2  . benztropine (COGENTIN) 0.5 MG tablet Take 1 tablet (0.5 mg total) by mouth at bedtime. 90 tablet 0  . escitalopram (LEXAPRO) 10 MG tablet Take 1 tablet (10 mg total) by mouth daily. 90 tablet 0  . haloperidol (HALDOL) 5 MG tablet Take 1/2 tab (2.5 mg) qam and 1 1/2 tabs (7.5 mg) qhs 180 tablet 0  . lithium 300 MG tablet Take 2 tab in am, 1 at noon and 2 tab in evening 450 tablet 0  . methylphenidate (RITALIN) 10 MG tablet Take 1 tablet (10 mg total) by mouth daily. 90 tablet 0  . nitrofurantoin (MACRODANTIN) 50 MG capsule Take 50 mg by mouth as needed.   5  . ondansetron (ZOFRAN) 4 MG tablet Take 1 tablet (4 mg total) by mouth every 8 (eight) hours as needed for nausea or vomiting. 30 tablet 4  . pantoprazole (PROTONIX) 40 MG tablet TAKE 1 TABLET 30 MINS BEFORE BREAKFAST AND SUPPER 60 tablet 11  . promethazine (PHENERGAN) 25 MG tablet Take 1 tablet (25 mg total) by mouth every 6 (six) hours as needed for nausea or vomiting. 40 tablet 1  . topiramate (TOPAMAX) 25 MG tablet Take 1 tablet (25 mg total) by mouth at bedtime. 30 tablet 1  . traZODone (DESYREL) 100 MG tablet TAKE 1 TAB AT BEDTIME 90 tablet 0  . valACYclovir (VALTREX) 500 MG tablet Take 500 mg by mouth once a week.      No current facility-administered medications for this visit.       Musculoskeletal: Strength & Muscle Tone: within normal limits Gait & Station: normal Patient leans: N/A  Psychiatric Specialty Exam: ROS  Blood pressure 96/61, pulse 68, height 5\' 3"  (1.6 m), weight 119 lb (54 kg), SpO2 97 %.Body mass index is 21.08 kg/m.  General Appearance: Casual  Eye Contact:  Good  Speech:  Clear and Coherent and Fast  Volume:  Normal  Mood:  Anxious  Affect:  Congruent  Thought Process:  Goal Directed  Orientation:  Full (Time, Place, and Person)  Thought Content: Logical   Suicidal Thoughts:  No  Homicidal Thoughts:  No  Memory:  Immediate;   Good Recent;   Good Remote;   Good  Judgement:  Good  Insight:  Present  Psychomotor Activity:  Normal  Concentration:  Concentration: Good and Attention Span: Good  Recall:  Good  Fund of Knowledge: Good  Language: Good  Akathisia:  No  Handed:  Right  AIMS (if indicated): not done  Assets:  Communication Skills Desire for [place] Talents/Skills  ADL's:  Intact  Cognition: WNL  Sleep:  Good   Screenings:   Assessment and Plan: Schizoaffective disorder, bipolar type.  ADHD.  Anxiety disorder NOS.  Patient doing much better on the medication.  Now she agreed to see a therapist and we will schedule appointment to see Janett Billow in this office.  I reviewed blood work results.  Her lithium level is subtherapeutic but she is compliant with the lithium and believe it is working very well.  Her hemoglobin A1c is normal.  I will continue lithium 600 mg in the morning, 300 mg in the afternoon and 600 mg at bedtime.  Cogentin 0.5 mg at bedtime, trazodone 100 mg at bedtime, Haldol 2 mg in the morning and 4 mg at bedtime, Ritalin  10 mg daily and she will continue Xanax 0.5 mg as needed.  Discussed medication side effect especially benzodiazepine and stimulant dependence, tolerance and withdrawal.  We will repeat lithium level on her next appointment.   Recommended to call us back if she has any question, concern for feel worsening of the symptoms.  Follow-up in 3 months.   Kathlee Nations, MD 05/23/2017, 12:15 PM

## 2017-06-17 ENCOUNTER — Other Ambulatory Visit (HOSPITAL_COMMUNITY): Payer: Self-pay | Admitting: Psychiatry

## 2017-06-28 ENCOUNTER — Other Ambulatory Visit (HOSPITAL_COMMUNITY): Payer: Self-pay | Admitting: Psychiatry

## 2017-06-28 MED ORDER — TOPIRAMATE 25 MG PO TABS: ORAL_TABLET | ORAL | 1 refills | Status: DC

## 2017-08-02 ENCOUNTER — Other Ambulatory Visit (HOSPITAL_COMMUNITY): Payer: Self-pay | Admitting: Psychiatry

## 2017-08-22 ENCOUNTER — Ambulatory Visit (HOSPITAL_COMMUNITY): Payer: Self-pay | Admitting: Psychiatry

## 2017-08-23 ENCOUNTER — Other Ambulatory Visit (HOSPITAL_COMMUNITY): Payer: Self-pay

## 2017-08-23 ENCOUNTER — Telehealth (HOSPITAL_COMMUNITY): Payer: Self-pay

## 2017-08-23 DIAGNOSIS — F250 Schizoaffective disorder, bipolar type: Secondary | ICD-10-CM

## 2017-08-23 DIAGNOSIS — F251 Schizoaffective disorder, depressive type: Secondary | ICD-10-CM

## 2017-08-23 MED ORDER — ALPRAZOLAM 0.5 MG PO TABS: 0.5000 mg | ORAL_TABLET | Freq: Every evening | ORAL | 2 refills | Status: DC | PRN

## 2017-08-23 MED ORDER — TRAZODONE HCL 100 MG PO TABS: ORAL_TABLET | ORAL | 0 refills | Status: DC

## 2017-08-23 MED ORDER — ESCITALOPRAM OXALATE 10 MG PO TABS: 10.0000 mg | ORAL_TABLET | Freq: Every day | ORAL | 0 refills | Status: DC

## 2017-08-23 MED ORDER — LITHIUM CARBONATE 300 MG PO TABS: ORAL_TABLET | ORAL | 0 refills | Status: DC

## 2017-08-23 MED ORDER — HALOPERIDOL 5 MG PO TABS: ORAL_TABLET | ORAL | 0 refills | Status: DC

## 2017-08-23 MED ORDER — BENZTROPINE MESYLATE 0.5 MG PO TABS: 0.5000 mg | ORAL_TABLET | Freq: Every day | ORAL | 0 refills | Status: DC

## 2017-08-23 NOTE — Telephone Encounter (Signed)
Arfeen patient:  Patient had to be rescheduled to October, I have sent refills on all medications but the Ritalin. She takes 10 mg a day and uses [facility] in [place]. Please review thank you

## 2017-08-24 MED ORDER — METHYLPHENIDATE HCL 10 MG PO TABS: 10.0000 mg | ORAL_TABLET | Freq: Every day | ORAL | 0 refills | Status: DC

## 2017-08-24 NOTE — Telephone Encounter (Signed)
Refill sent.

## 2017-09-16 ENCOUNTER — Other Ambulatory Visit (HOSPITAL_COMMUNITY): Payer: Self-pay | Admitting: Psychiatry

## 2017-10-04 DIAGNOSIS — Z23 Encounter for immunization: Secondary | ICD-10-CM | POA: Diagnosis not present

## 2017-10-17 ENCOUNTER — Ambulatory Visit (INDEPENDENT_AMBULATORY_CARE_PROVIDER_SITE_OTHER): Payer: Medicare Other | Admitting: Psychiatry

## 2017-10-17 ENCOUNTER — Encounter (HOSPITAL_COMMUNITY): Payer: Self-pay | Admitting: Psychiatry

## 2017-10-17 VITALS — BP 126/74 | Ht 63.0 in | Wt 125.0 lb

## 2017-10-17 DIAGNOSIS — F900 Attention-deficit hyperactivity disorder, predominantly inattentive type: Secondary | ICD-10-CM

## 2017-10-17 DIAGNOSIS — F411 Generalized anxiety disorder: Secondary | ICD-10-CM

## 2017-10-17 DIAGNOSIS — F250 Schizoaffective disorder, bipolar type: Secondary | ICD-10-CM | POA: Diagnosis not present

## 2017-10-17 MED ORDER — LITHIUM CARBONATE 300 MG PO TABS: ORAL_TABLET | ORAL | 0 refills | Status: DC

## 2017-10-17 MED ORDER — METHYLPHENIDATE HCL 10 MG PO TABS: 10.0000 mg | ORAL_TABLET | Freq: Every day | ORAL | 0 refills | Status: DC

## 2017-10-17 MED ORDER — HALOPERIDOL 5 MG PO TABS: ORAL_TABLET | ORAL | 0 refills | Status: DC

## 2017-10-17 MED ORDER — ESCITALOPRAM OXALATE 10 MG PO TABS: 10.0000 mg | ORAL_TABLET | Freq: Every day | ORAL | 0 refills | Status: DC

## 2017-10-17 MED ORDER — BENZTROPINE MESYLATE 0.5 MG PO TABS: 0.5000 mg | ORAL_TABLET | Freq: Every day | ORAL | 0 refills | Status: DC

## 2017-10-17 MED ORDER — TRAZODONE HCL 100 MG PO TABS: ORAL_TABLET | ORAL | 0 refills | Status: DC

## 2017-10-17 NOTE — Progress Notes (Signed)
[facility] MD/PA/NP OP Progress Note  10/17/2017 10:46 AM MRY LAMIA  MRN:  [id number]  Chief Complaint: I am doing so-so.  There are days when I feel very sad.  My stepfather is not doing well.  HPI: Ruth Duncan came for her follow-up appointment.  She admitted there are days when she feels very sad and depressed but denies any crying spells or any feeling of hopelessness or worthlessness.  She is not sure if the recent episode due to change in the weather but she has noticed withdrawn and isolated.  She is taking her medication as prescribed.  She takes Xanax as needed which is helping her anxiety.  Her attention, focus and multitasking is good with her stimulant.  She has no tremors shakes or any EPS.  She continues to have hallucination but she is able to handle them very well.  Her stepfather not doing very well and continued to get chemotherapy.  She is very pleased that she has a second granddaughter and she is keeping them most of the time.  She also like to have a Thanksgiving dinner tradition at her home.  Since the last time her husband has been not involved in treating but she also feels very nervous and worried about his loyalty.  Her appetite is okay.  Her vital signs are stable.  Visit Diagnosis:    ICD-10-CM   1. Attention deficit hyperactivity disorder (ADHD), predominantly inattentive type F90.0 methylphenidate (RITALIN) 10 MG tablet  2. Schizoaffective disorder, bipolar type ([facility]) F25.0 methylphenidate (RITALIN) 10 MG tablet    haloperidol (HALDOL) 5 MG tablet    escitalopram (LEXAPRO) 10 MG tablet    traZODone (DESYREL) 100 MG tablet    lithium 300 MG tablet  3. GAD (generalized anxiety disorder) F41.1 escitalopram (LEXAPRO) 10 MG tablet    Past Psychiatric History: Reviewed  Past Medical History:  Past Medical History:  Diagnosis Date  . Bipolar disorder ([facility])   . Chronic abdominal pain    hx of IBS-D, ? bile salt-induced diarrhea  . Chronic eczema of hand 2012   BILATERAL ON  MTX SINCE 2014  . Complication of anesthesia   . Diverticulitis   . Ovarian cancer ([facility]) 2010  . Surgical menopause 2010  . Tubular adenoma 2010    Past Surgical History:  Procedure Laterality Date  . ABDOMINAL HYSTERECTOMY  2010  . APPENDECTOMY  2004  . BACTERIAL OVERGROWTH TEST N/A 09/01/2015   Procedure: BACTERIAL OVERGROWTH TEST;  Surgeon: Danie Binder, MD;  [facility];  Service: Endoscopy;  Laterality: N/A;  700  . CESAREAN SECTION  1998  . COLONOSCOPY  12/2008   tubular adenomas, negative microscopic colitis. Due for Flex Sig Dec 2015  . ESOPHAGOGASTRODUODENOSCOPY  05/06/2008   [id number] esophagus without evidence of Barrett's, mass, erosion, ulceration or stricture/ Hiatal hernia/ Normal duodenal bulb and second portion of the duodenum  . ESOPHAGOGASTRODUODENOSCOPY N/A 10/04/2014   Dr. Oneida Alar: patent stricture at GE junction, no dilation, small hiatal hernia, mild non-erosive gastritis (benign), multiple small gastric polyps, benign. Capsule study recommended but never completed   . FLEXIBLE SIGMOIDOSCOPY N/A 02/18/2014   SLF: Normal small bowel. formed stool at anastomosis. Stool cleared with irrigation. One superfical erosion at the anastomosis. otherwise normal. normal rectal mucosa  . SUBTOTAL COLECTOMY  2011  . TUBAL LIGATION      Family Psychiatric History: Reviewed  Family History:  Family History  Problem Relation Age of Onset  . Alcohol abuse Paternal Grandfather   .  Hypertension Mother   . Kidney disease Father   . Hypertension Father   . Coronary artery disease Father   . Breast cancer Paternal Grandmother        post menopausal  . Colon cancer Neg Hx     Social History:  Social History   Socioeconomic History  . Marital status: Married    Spouse name: Not on file  . Number of children: Not on file  . Years of education: Not on file  . Highest education level: Not on file  Occupational History  . Occupation: Disability    Comment:  since 2010  Social Needs  . Financial resource strain: Not on file  . Food insecurity:    Worry: Not on file    Inability: Not on file  . Transportation needs:    Medical: Not on file    Non-medical: Not on file  Tobacco Use  . Smoking status: Former Smoker    Packs/day: 1.00    Years: 30.00    Pack years: 30.00    Last attempt to quit: 11/04/2012    Years since quitting: 4.9  . Smokeless tobacco: Never Used  Substance and Sexual Activity  . Alcohol use: No    Alcohol/week: 0.0 standard drinks  . Drug use: No  . Sexual activity: Yes    Partners: Male    Birth control/protection: None  Lifestyle  . Physical activity:    Days per week: Not on file    Minutes per session: Not on file  . Stress: Not on file  Relationships  . Social connections:    Talks on phone: Not on file    Gets together: Not on file    Attends religious service: Not on file    Active member of club or organization: Not on file    Attends meetings of clubs or organizations: Not on file    Relationship status: Not on file  Other Topics Concern  . Not on file  Social History Narrative   Ruth Duncan was born and grew up in [place], [place]. Her parents divorced when she was 81 years of age, and she grew up with her paternal grandmother and grandfather. Her grandfather was an alcoholic, and Ruth Duncan found him dead of an MI at the age of 67. Her mother used to lock her in a dark bathroom for an hour or so very frequently, and Ruth Duncan continues to have phobias associated with opening a bathroom door. Ruth Duncan graduated from [employer], but has had no real career because she was unable to hold a job. She has been married for 82 years and has 3 boys, twins age 63 and her oldest son age 48. 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 She is currently attending Alcoholics Anonymous meetings 3 times a week, has a sponsor, and is working the 12 steps.    Allergies: No Known  Allergies  Metabolic Disorder Labs: Lab Results  Component Value Date   HGBA1C 5.2 02/21/2017   MPG 103 05/17/2016   MPG 117 (H) 04/17/2013   No results found for: PROLACTIN No results found for: CHOL, TRIG, HDL, CHOLHDL, VLDL, LDLCALC Lab Results  Component Value Date   TSH 1.755 04/17/2013    Therapeutic Level Labs: Lab Results  Component Value Date   LITHIUM <0.1 (L) 02/21/2017   LITHIUM <0.3 (L) 09/13/2016   No results found for: VALPROATE No components found for:  CBMZ  Current Medications: Current  Outpatient Medications  Medication Sig Dispense Refill  . ALPRAZolam (XANAX) 0.5 MG tablet Take 1 tablet (0.5 mg total) by mouth at bedtime as needed. 30 tablet 2  . benztropine (COGENTIN) 0.5 MG tablet Take 1 tablet (0.5 mg total) by mouth at bedtime. 90 tablet 0  . escitalopram (LEXAPRO) 10 MG tablet Take 1 tablet (10 mg total) by mouth daily. 90 tablet 0  . haloperidol (HALDOL) 5 MG tablet Take 1/2 tab (2.5 mg) qam and 1 1/2 tabs (7.5 mg) qhs 180 tablet 0  . lithium 300 MG tablet Take 2 tab in am, 1 at noon and 2 tab in evening 450 tablet 0  . methylphenidate (RITALIN) 10 MG tablet Take 1 tablet (10 mg total) by mouth daily. 90 tablet 0  . nitrofurantoin (MACRODANTIN) 50 MG capsule Take 50 mg by mouth as needed.   5  . ondansetron (ZOFRAN) 4 MG tablet Take 1 tablet (4 mg total) by mouth every 8 (eight) hours as needed for nausea or vomiting. 30 tablet 4  . pantoprazole (PROTONIX) 40 MG tablet TAKE 1 TABLET 30 MINS BEFORE BREAKFAST AND SUPPER 60 tablet 11  . promethazine (PHENERGAN) 25 MG tablet Take 1 tablet (25 mg total) by mouth every 6 (six) hours as needed for nausea or vomiting. 40 tablet 1  . topiramate (TOPAMAX) 25 MG tablet TAKE 1 TABLET BY MOUTH EVERYDAY AT BEDTIME 30 tablet 1  . traZODone (DESYREL) 100 MG tablet TAKE 1 TAB AT BEDTIME 90 tablet 0  . valACYclovir (VALTREX) 500 MG tablet Take 500 mg by mouth once a week.      No current facility-administered  medications for this visit.      Musculoskeletal: Strength & Muscle Tone: within normal limits Gait & Station: normal Patient leans: N/A  Psychiatric Specialty Exam: ROS  Blood pressure 126/74, height 5\' 3"  (1.6 m), weight 125 lb (56.7 kg).Body mass index is 22.14 kg/m.  General Appearance: Casual  Eye Contact:  Good  Speech:  Clear and Coherent  Volume:  Normal  Mood:  Depressed  Affect:  Congruent  Thought Process:  Goal Directed  Orientation:  Full (Time, Place, and Person)  Thought Content: Rumination   Suicidal Thoughts:  No  Homicidal Thoughts:  No  Memory:  Immediate;   Good Recent;   Good Remote;   Good  Judgement:  Good  Insight:  Good  Psychomotor Activity:  Normal  Concentration:  Concentration: Good and Attention Span: Good  Recall:  Good  Fund of Knowledge: Good  Language: Good  Akathisia:  No  Handed:  Right  AIMS (if indicated): not done  Assets:  Communication Skills Desire for Improvement Housing Resilience  ADL's:  Intact  Cognition: WNL  Sleep:  Fair   Screenings:   Assessment and Plan: Schizoaffective disorder, bipolar type.  Generalized anxiety disorder.  ADHD combined type.  Patient seen some changes in her mood since the weather change.  She does not want to change the medication but like to keep appointment in 6 weeks if her condition does not improve.  I will continue lithium 300 mg, 2 tablet in the morning, 1 at noon and 2 at evening.  Haldol 2.5 mg in the morning and 7.5 mg at bedtime, Lexapro 10 mg daily, Cogentin 0.5 mg at bedtime, trazodone 100 mg at bedtime, Ritalin 10 mg daily and Xanax 0.5 mg as needed yet recommended to call us back if she has any question or any concern.  I will see her again in  6 weeks.    Kathlee Nations, MD 10/17/2017, 10:46 AM

## 2017-10-27 DIAGNOSIS — N951 Menopausal and female climacteric states: Secondary | ICD-10-CM | POA: Diagnosis not present

## 2017-11-21 DIAGNOSIS — N951 Menopausal and female climacteric states: Secondary | ICD-10-CM | POA: Diagnosis not present

## 2017-11-22 DIAGNOSIS — M25531 Pain in right wrist: Secondary | ICD-10-CM | POA: Diagnosis not present

## 2017-11-28 ENCOUNTER — Ambulatory Visit (HOSPITAL_COMMUNITY): Payer: Medicare Other | Admitting: Psychiatry

## 2017-12-06 DIAGNOSIS — N951 Menopausal and female climacteric states: Secondary | ICD-10-CM | POA: Diagnosis not present

## 2018-01-09 DIAGNOSIS — H1013 Acute atopic conjunctivitis, bilateral: Secondary | ICD-10-CM | POA: Diagnosis not present

## 2018-01-11 ENCOUNTER — Encounter (HOSPITAL_COMMUNITY): Payer: Self-pay | Admitting: Psychiatry

## 2018-01-11 ENCOUNTER — Ambulatory Visit (INDEPENDENT_AMBULATORY_CARE_PROVIDER_SITE_OTHER): Payer: Medicare Other | Admitting: Psychiatry

## 2018-01-11 VITALS — BP 114/72 | HR 66 | Ht 63.0 in | Wt 128.0 lb

## 2018-01-11 DIAGNOSIS — F250 Schizoaffective disorder, bipolar type: Secondary | ICD-10-CM

## 2018-01-11 DIAGNOSIS — F411 Generalized anxiety disorder: Secondary | ICD-10-CM | POA: Diagnosis not present

## 2018-01-11 DIAGNOSIS — F900 Attention-deficit hyperactivity disorder, predominantly inattentive type: Secondary | ICD-10-CM | POA: Diagnosis not present

## 2018-01-11 DIAGNOSIS — F410 Panic disorder [episodic paroxysmal anxiety] without agoraphobia: Secondary | ICD-10-CM | POA: Diagnosis not present

## 2018-01-11 MED ORDER — METHYLPHENIDATE HCL 10 MG PO TABS: 10.0000 mg | ORAL_TABLET | Freq: Every day | ORAL | 0 refills | Status: DC

## 2018-01-11 MED ORDER — HALOPERIDOL 5 MG PO TABS: ORAL_TABLET | ORAL | 0 refills | Status: DC

## 2018-01-11 MED ORDER — LITHIUM CARBONATE 300 MG PO TABS: ORAL_TABLET | ORAL | 0 refills | Status: DC

## 2018-01-11 MED ORDER — BENZTROPINE MESYLATE 0.5 MG PO TABS: 0.5000 mg | ORAL_TABLET | Freq: Every day | ORAL | 0 refills | Status: DC

## 2018-01-11 MED ORDER — ESCITALOPRAM OXALATE 10 MG PO TABS: 10.0000 mg | ORAL_TABLET | Freq: Every day | ORAL | 0 refills | Status: DC

## 2018-01-11 MED ORDER — TRAZODONE HCL 100 MG PO TABS: ORAL_TABLET | ORAL | 0 refills | Status: DC

## 2018-01-11 MED ORDER — ALPRAZOLAM 0.5 MG PO TABS: 0.5000 mg | ORAL_TABLET | Freq: Every evening | ORAL | 1 refills | Status: DC | PRN
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
## 2018-01-18 ENCOUNTER — Ambulatory Visit (HOSPITAL_COMMUNITY): Payer: Medicare Other | Admitting: Psychiatry

## 2018-01-24 ENCOUNTER — Other Ambulatory Visit: Payer: Self-pay | Admitting: Family Medicine

## 2018-01-24 DIAGNOSIS — Z1231 Encounter for screening mammogram for malignant neoplasm of breast: Secondary | ICD-10-CM
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
## 2018-02-27 ENCOUNTER — Ambulatory Visit
Admission: RE | Admit: 2018-02-27 | Discharge: 2018-02-27 | Disposition: A | Discharge status: Home / Self Care | Payer: Medicare Other | Source: Ambulatory Visit | Attending: Family Medicine | Admitting: Family Medicine

## 2018-02-27 DIAGNOSIS — Z1231 Encounter for screening mammogram for malignant neoplasm of breast: Secondary | ICD-10-CM | POA: Diagnosis not present

## 2018-04-10 ENCOUNTER — Other Ambulatory Visit: Payer: Self-pay

## 2018-04-10 ENCOUNTER — Ambulatory Visit (INDEPENDENT_AMBULATORY_CARE_PROVIDER_SITE_OTHER): Payer: Medicaid Other | Admitting: Psychiatry

## 2018-04-10 DIAGNOSIS — Z79899 Other long term (current) drug therapy: Secondary | ICD-10-CM

## 2018-04-10 DIAGNOSIS — F411 Generalized anxiety disorder: Secondary | ICD-10-CM | POA: Diagnosis not present

## 2018-04-10 DIAGNOSIS — Z87891 Personal history of nicotine dependence: Secondary | ICD-10-CM

## 2018-04-10 DIAGNOSIS — F900 Attention-deficit hyperactivity disorder, predominantly inattentive type: Secondary | ICD-10-CM | POA: Diagnosis not present

## 2018-04-10 DIAGNOSIS — F250 Schizoaffective disorder, bipolar type: Secondary | ICD-10-CM

## 2018-04-10 DIAGNOSIS — F410 Panic disorder [episodic paroxysmal anxiety] without agoraphobia: Secondary | ICD-10-CM | POA: Diagnosis not present

## 2018-04-10 MED ORDER — LITHIUM CARBONATE 300 MG PO TABS: ORAL_TABLET | ORAL | 0 refills | Status: DC

## 2018-04-10 MED ORDER — METHYLPHENIDATE HCL 10 MG PO TABS: 10.0000 mg | ORAL_TABLET | Freq: Every day | ORAL | 0 refills | Status: DC

## 2018-04-10 MED ORDER — HALOPERIDOL 5 MG PO TABS: ORAL_TABLET | ORAL | 0 refills | Status: DC

## 2018-04-10 MED ORDER — TRAZODONE HCL 100 MG PO TABS: ORAL_TABLET | ORAL | 0 refills | Status: DC

## 2018-04-10 MED ORDER — ESCITALOPRAM OXALATE 10 MG PO TABS: 10.0000 mg | ORAL_TABLET | Freq: Every day | ORAL | 0 refills | Status: DC

## 2018-04-10 MED ORDER — BENZTROPINE MESYLATE 0.5 MG PO TABS: 0.5000 mg | ORAL_TABLET | Freq: Every day | ORAL | 0 refills | Status: DC

## 2018-04-10 MED ORDER — ALPRAZOLAM 0.5 MG PO TABS: 0.5000 mg | ORAL_TABLET | Freq: Every evening | ORAL | 1 refills | Status: DC | PRN

## 2018-04-10 NOTE — Progress Notes (Signed)
Virtual Visit via Telephone Note  I connected with Ruth Duncan on 04/10/18 at 10:00 AM EDT by telephone and verified that I am speaking with the correct person using two identifiers.   I discussed the limitations, risks, security and privacy concerns of performing an evaluation and management service by telephone and the availability of in person appointments. I also discussed with the patient that there may be a patient responsible charge related to this service. The patient expressed understanding and agreed to proceed.   History of Present Illness: Patient was evaluated through phone session.  She endorsed increased anxiety due to pandemic coronavirus but she also feels the current medicine is working.  She is taking Xanax more frequently but still as needed.  She is concerned about her family member.  Her stepfather getting treatment for his cancer, her mother-in-law doing better with breast cancer and now her mother had some heart issues.  She is trying to avoid visit them because of pandemic virus.  However she is helping her both grandbabies and keeping them all day.  She is trying to keep herself busy and she noticed time goes very fast.  She is not working but her husband is working as much she can.  She feels the current medicine is working and does not need to be changed.  She reported no side effects including tremors or shakes.  She is sad because [place] is close but she feels that she has not gained weight as she is trying to keep herself busy.  She denies any mania, irritability, anger however sometimes still struggle with focus, attention and multitasking.  She is pleased the family members are doing well.  She is sleeping good with the trazodone.  She denies any suicidal thoughts or homicidal thoughts.  She is not drinking or using any illegal substances.  He supposed to have lithium level however due to pandemic coronavirus we will postpone until her next visit.  Past Psychiatric History:  Reviewed. History of addiction with alcohol and cocaine.  History of inpatient at behavioral health center in 2007 and later rehabilitation at [facility].  Had tried Strattera, Prozac, Trilafon, Abilify, Lamictal.  No history of suicidal attempt.  History of trouble with the law later probation.     Observations/Objective: Limited mental stat examination done on the phone.  Her speech is fast but clear and coherent.  She describes her mood good.  Her thought process logical, goal-directed.  She appears to be distracted at times but denies any suicidal thoughts or homicidal thought.  She still struggle with hallucination but no change from the past.  She is alert and oriented x3.  Her fund of knowledge is adequate.  Her insight and judgment is okay.  Assessment and Plan: Schizoaffective disorder, bipolar type.  Attention deficit hyperactivity disorder, inattentive type.  Generalized anxiety disorder.  Panic attacks.  Reassurance given about current situation.  Patient is a stable given the circumstances of pandemic coronavirus.  She does not want to change medication since it is working very well.  I will continue Ritalin 10 mg daily, Haldol 2.5 mg in the morning and 7.5 mg at bedtime, lithium 300 mg 2 tablet in the morning 1 at noon and 2 at bedtime, Cogentin 0.5 mg at bedtime, trazodone 100 mg at bedtime and Xanax 0.5 mg as needed for severe panic attacks.  We discussed in detail about benzodiazepine and stimulants abuse, tolerance and withdrawal.  She does not ask early refills.  We will do  blood work on her next appointment.  Recommended to call us back if she has any question, concern or if she feels worsening of the symptoms.  Follow-up in 3 months.  Follow Up Instructions:    I discussed the assessment and treatment plan with the patient. The patient was provided an opportunity to ask questions and all were answered. The patient agreed with the plan and demonstrated an understanding of the  instructions.   The patient was advised to call back or seek an in-person evaluation if the symptoms worsen or if the condition fails to improve as anticipated.  I provided 25 minutes of non-face-to-face time during this encounter.   Kathlee Nations, MD

## 2018-04-14 ENCOUNTER — Other Ambulatory Visit: Payer: Self-pay | Admitting: Gastroenterology

## 2018-05-01 DIAGNOSIS — R079 Chest pain, unspecified: Secondary | ICD-10-CM | POA: Diagnosis not present

## 2018-05-01 DIAGNOSIS — R1011 Right upper quadrant pain: Secondary | ICD-10-CM | POA: Diagnosis not present

## 2018-06-28 ENCOUNTER — Other Ambulatory Visit (HOSPITAL_COMMUNITY): Payer: Self-pay

## 2018-06-28 DIAGNOSIS — F410 Panic disorder [episodic paroxysmal anxiety] without agoraphobia: Secondary | ICD-10-CM

## 2018-06-28 DIAGNOSIS — F411 Generalized anxiety disorder: Secondary | ICD-10-CM

## 2018-06-28 MED ORDER — ALPRAZOLAM 0.5 MG PO TABS: 0.5000 mg | ORAL_TABLET | Freq: Every evening | ORAL | 0 refills | Status: DC | PRN

## 2018-07-08 ENCOUNTER — Other Ambulatory Visit (HOSPITAL_COMMUNITY): Payer: Self-pay | Admitting: Psychiatry

## 2018-07-08 DIAGNOSIS — F250 Schizoaffective disorder, bipolar type: Secondary | ICD-10-CM

## 2018-07-08 DIAGNOSIS — F411 Generalized anxiety disorder: Secondary | ICD-10-CM

## 2018-07-10 ENCOUNTER — Encounter (HOSPITAL_COMMUNITY): Payer: Self-pay | Admitting: Psychiatry

## 2018-07-10 ENCOUNTER — Ambulatory Visit (INDEPENDENT_AMBULATORY_CARE_PROVIDER_SITE_OTHER): Payer: Medicare Other | Admitting: Psychiatry

## 2018-07-10 ENCOUNTER — Other Ambulatory Visit: Payer: Self-pay

## 2018-07-10 DIAGNOSIS — F250 Schizoaffective disorder, bipolar type: Secondary | ICD-10-CM

## 2018-07-10 DIAGNOSIS — F900 Attention-deficit hyperactivity disorder, predominantly inattentive type: Secondary | ICD-10-CM

## 2018-07-10 DIAGNOSIS — Z79899 Other long term (current) drug therapy: Secondary | ICD-10-CM

## 2018-07-10 DIAGNOSIS — F411 Generalized anxiety disorder: Secondary | ICD-10-CM | POA: Diagnosis not present

## 2018-07-10 DIAGNOSIS — F410 Panic disorder [episodic paroxysmal anxiety] without agoraphobia: Secondary | ICD-10-CM

## 2018-07-10 MED ORDER — BENZTROPINE MESYLATE 0.5 MG PO TABS: 0.5000 mg | ORAL_TABLET | Freq: Every day | ORAL | 0 refills | Status: DC

## 2018-07-10 MED ORDER — METHYLPHENIDATE HCL 10 MG PO TABS: 10.0000 mg | ORAL_TABLET | Freq: Every day | ORAL | 0 refills | Status: DC

## 2018-07-10 MED ORDER — ESCITALOPRAM OXALATE 10 MG PO TABS: 10.0000 mg | ORAL_TABLET | Freq: Every day | ORAL | 0 refills | Status: DC

## 2018-07-10 MED ORDER — CLONAZEPAM 0.5 MG PO TABS: 0.5000 mg | ORAL_TABLET | Freq: Every day | ORAL | 0 refills | Status: DC | PRN

## 2018-07-10 MED ORDER — HALOPERIDOL 10 MG PO TABS: ORAL_TABLET | ORAL | 0 refills | Status: DC

## 2018-07-10 MED ORDER — LITHIUM CARBONATE 300 MG PO TABS: ORAL_TABLET | ORAL | 0 refills | Status: DC

## 2018-07-10 MED ORDER — TRAZODONE HCL 100 MG PO TABS: ORAL_TABLET | ORAL | 0 refills | Status: DC

## 2018-07-10 NOTE — Progress Notes (Signed)
Virtual Visit via Telephone Note  I connected with Ruth Duncan on 07/10/18 at  4:00 PM EDT by telephone and verified that I am speaking with the correct person using two identifiers.   I discussed the limitations, risks, security and privacy concerns of performing an evaluation and management service by telephone and the availability of in person appointments. I also discussed with the patient that there may be a patient responsible charge related to this service. The patient expressed understanding and agreed to proceed.   History of Present Illness: Patient was evaluated through phone session.  She endorsed increased anxiety and stress and sometimes she feels that things are not going well.  She is having racing thoughts, poor sleep and she also noticed increased hallucination in recent weeks.  She feels sometimes people calling her name however when she turned around no one is there.  She admitted increased stress due to mother has recent major heart surgery and she also helping her stepfather who had cancer.  She has to change her bladder bag frequently.  She feels sometimes very tired.  She also feel that her attention and focus is not as good.  However she is afraid to take more medication because she does not want to be more anxious and issues with sleep.  She admitted lately her husband noticed that she is too distracted and easily stressed out.  She is taking Xanax on a regular basis but sometimes it does not help.  She has no tremors, shakes.  She denies any paranoia or any suicidal thoughts or homicidal thought.  She feels that otherwise things are going well as family numbers are doing well.  She told that trazodone used to help her a lot but lately she does not feel it is helping as much.  She is also concerned about COVID-19 because her mother has no major heart surgery and father has cancer.  Her appetite is okay.  She reported her weight is stable.  She has not done blood work including  lithium level in a while.  Past Psychiatric History:Reviewed. History of addiction with alcohol and cocaine. History of inpatient at behavioral health center in 2007 and later rehabilitation at [facility]. Had tried Strattera, Prozac, Trilafon, Abilify, Lamictal. No history of suicidal attempt. History of trouble with the law later probation.    Psychiatric Specialty Exam: Physical Exam  ROS  There were no vitals taken for this visit.There is no height or weight on file to calculate BMI.  General Appearance: NA  Eye Contact:  NA  Speech:  Clear and Coherent and fast  Volume:  Increased  Mood:  Anxious  Affect:  NA  Thought Process:  Descriptions of Associations: Intact  Orientation:  Full (Time, Place, and Person)  Thought Content:  Hallucinations: Auditory people calling my name and Rumination  Suicidal Thoughts:  No  Homicidal Thoughts:  No  Memory:  Immediate;   Good Recent;   Good Remote;   Good  Judgement:  Good  Insight:  Good  Psychomotor Activity:  Increased  Concentration:  Concentration: Good and Attention Span: Fair  Recall:  [place] of Knowledge:  Good  Language:  Good  Akathisia:  No  Handed:  Right  AIMS (if indicated):     Assets:  Communication Skills Desire for [place] Talents/Skills Transportation  ADL's:  Intact  Cognition:  WNL  Sleep:   fair      Assessment and Plan: Schizoaffective disorder, bipolar type.  Attention deficit disorder, inattentive type.  Generalized anxiety disorder.  Panic attacks.  Discussed her current medication.  Recommend to try Klonopin instead of Xanax since Klonopin has a longer half-life.  However patient is concerned if Klonopin did not work that I recommend she can go back to Xanax.  I will also increase Haldol to 5 mg in the morning and 10 mg at bedtime to help her racing thoughts, anxiety.  Discussed medication side effect specially increase Haldol can cause worsening of the  tremors.  Continue lithium 600 mg in the morning, 1 at noon and 600 mg at bedtime, Lexapro 10 mg daily, Cogentin 0.5 mg twice a day, keep the trazodone 100 mg at bedtime and Ritalin 10 mg daily.  We will do blood work including lithium level at [facility] location.  Patient lives in [place].  Discussed benzodiazepine and stimulant abuse, tolerance and withdrawal.  She is not interested in therapy.  Recommended to call us back if she is any question or any concern.  Follow-up in 3 months.  Patient will call if she need more refills on the Klonopin.  Follow Up Instructions:    I discussed the assessment and treatment plan with the patient. The patient was provided an opportunity to ask questions and all were answered. The patient agreed with the plan and demonstrated an understanding of the instructions.   The patient was advised to call back or seek an in-person evaluation if the symptoms worsen or if the condition fails to improve as anticipated.  I provided 20 minutes of non-face-to-face time during this encounter.   Kathlee Nations, MD

## 2018-07-11 ENCOUNTER — Ambulatory Visit (HOSPITAL_COMMUNITY): Payer: Medicare Other | Admitting: Psychiatry

## 2018-08-16 ENCOUNTER — Telehealth (HOSPITAL_COMMUNITY): Payer: Self-pay

## 2018-08-16 DIAGNOSIS — F410 Panic disorder [episodic paroxysmal anxiety] without agoraphobia: Secondary | ICD-10-CM

## 2018-08-16 MED ORDER — ALPRAZOLAM 0.5 MG PO TABS: 0.5000 mg | ORAL_TABLET | Freq: Every day | ORAL | 0 refills | Status: DC | PRN

## 2018-08-16 NOTE — Telephone Encounter (Signed)
Patient called regarding her medication. She stated that her Xanax was changed to Klonopin 0.5mg . She also stated that the Klonopin does not work and she doesn't like it. Patient requested to go back on the Xanax. Please review and advise. Thank you.

## 2018-08-16 NOTE — Telephone Encounter (Signed)
[place] and restart Xanax 0.5 mg daily as needed for anxiety. I will send the prescription.

## 2018-08-24 NOTE — Telephone Encounter (Signed)
Spoke with patient and relayed message from the doctor.

## 2018-10-02 DIAGNOSIS — Z79899 Other long term (current) drug therapy: Secondary | ICD-10-CM | POA: Diagnosis not present

## 2018-10-03 LAB — COMPLETE METABOLIC PANEL WITH GFR
AG Ratio: 1.8 (calc) (ref 1.0–2.5)
ALT: 14 U/L (ref 6–29)
AST: 14 U/L (ref 10–35)
Albumin: 4.3 g/dL (ref 3.6–5.1)
Alkaline phosphatase (APISO): 55 U/L (ref 31–125)
BUN: 10 mg/dL (ref 7–25)
CO2: 29 mmol/L (ref 20–32)
Calcium: 8.8 mg/dL (ref 8.6–10.2)
Chloride: 105 mmol/L (ref 98–110)
Creat: 0.62 mg/dL (ref 0.50–1.10)
GFR, Est African American: 123 mL/min/{1.73_m2} (ref >=60)
GFR, Est Non African American: 106 mL/min/{1.73_m2} (ref >=60)
Globulin: 2.4 g/dL (calc) (ref 1.9–3.7)
Glucose, Bld: 90 mg/dL (ref 65–99)
Potassium: 4.1 mmol/L (ref 3.5–5.3)
Sodium: 138 mmol/L (ref 135–146)
Total Bilirubin: 0.5 mg/dL (ref 0.2–1.2)
Total Protein: 6.7 g/dL (ref 6.1–8.1)

## 2018-10-03 LAB — CBC WITH DIFFERENTIAL/PLATELET
Absolute Monocytes: 490 cells/uL (ref 200–950)
Basophils Absolute: 28 cells/uL (ref 0–200)
Basophils Relative: 0.4 %
Eosinophils Absolute: 161 cells/uL (ref 15–500)
Eosinophils Relative: 2.3 %
HCT: 39.2 % (ref 35.0–45.0)
Hemoglobin: 13.2 g/dL (ref 11.7–15.5)
Lymphs Abs: 1001 cells/uL (ref 850–3900)
MCH: 30.7 pg (ref 27.0–33.0)
MCHC: 33.7 g/dL (ref 32.0–36.0)
MCV: 91.2 fL (ref 80.0–100.0)
MPV: 10.4 fL (ref 7.5–12.5)
Monocytes Relative: 7 %
Neutro Abs: 5320 cells/uL (ref 1500–7800)
Neutrophils Relative %: 76 %
Platelets: 248 10*3/uL (ref 140–400)
RBC: 4.3 10*6/uL (ref 3.80–5.10)
RDW: 12.4 % (ref 11.0–15.0)
Total Lymphocyte: 14.3 %
WBC: 7 10*3/uL (ref 3.8–10.8)

## 2018-10-03 LAB — LITHIUM LEVEL: Lithium Lvl: <0.3 mmol/L — ABNORMAL LOW (ref 0.6–1.2)

## 2018-10-03 LAB — HEMOGLOBIN A1C
Hgb A1c MFr Bld: 5.3 % of total Hgb (ref <5.7)
Mean Plasma Glucose: 105 (calc)
eAG (mmol/L): 5.8 (calc)

## 2018-10-06 ENCOUNTER — Other Ambulatory Visit (HOSPITAL_COMMUNITY): Payer: Self-pay | Admitting: Psychiatry

## 2018-10-06 DIAGNOSIS — F411 Generalized anxiety disorder: Secondary | ICD-10-CM

## 2018-10-06 DIAGNOSIS — F250 Schizoaffective disorder, bipolar type: Secondary | ICD-10-CM

## 2018-10-09 ENCOUNTER — Ambulatory Visit (INDEPENDENT_AMBULATORY_CARE_PROVIDER_SITE_OTHER): Payer: Medicare Other | Admitting: Psychiatry

## 2018-10-09 ENCOUNTER — Encounter (HOSPITAL_COMMUNITY): Payer: Self-pay | Admitting: Psychiatry

## 2018-10-09 ENCOUNTER — Other Ambulatory Visit: Payer: Self-pay

## 2018-10-09 DIAGNOSIS — F900 Attention-deficit hyperactivity disorder, predominantly inattentive type: Secondary | ICD-10-CM

## 2018-10-09 DIAGNOSIS — F250 Schizoaffective disorder, bipolar type: Secondary | ICD-10-CM | POA: Diagnosis not present

## 2018-10-09 DIAGNOSIS — F411 Generalized anxiety disorder: Secondary | ICD-10-CM | POA: Diagnosis not present

## 2018-10-09 DIAGNOSIS — F410 Panic disorder [episodic paroxysmal anxiety] without agoraphobia: Secondary | ICD-10-CM | POA: Diagnosis not present

## 2018-10-09 MED ORDER — TRAZODONE HCL 150 MG PO TABS: 150.0000 mg | ORAL_TABLET | Freq: Every day | ORAL | 0 refills | Status: DC

## 2018-10-09 MED ORDER — ESCITALOPRAM OXALATE 10 MG PO TABS: 10.0000 mg | ORAL_TABLET | Freq: Every day | ORAL | 0 refills | Status: DC

## 2018-10-09 MED ORDER — HALOPERIDOL 10 MG PO TABS: ORAL_TABLET | ORAL | 0 refills | Status: DC

## 2018-10-09 MED ORDER — BENZTROPINE MESYLATE 0.5 MG PO TABS: 0.5000 mg | ORAL_TABLET | Freq: Every day | ORAL | 0 refills | Status: DC

## 2018-10-09 MED ORDER — LITHIUM CARBONATE 300 MG PO TABS: ORAL_TABLET | ORAL | 1 refills | Status: DC

## 2018-10-09 MED ORDER — LURASIDONE HCL 40 MG PO TABS: 40.0000 mg | ORAL_TABLET | Freq: Every day | ORAL | 0 refills | Status: DC

## 2018-10-09 MED ORDER — METHYLPHENIDATE HCL 10 MG PO TABS: 10.0000 mg | ORAL_TABLET | Freq: Every day | ORAL | 0 refills | Status: DC

## 2018-10-09 MED ORDER — ALPRAZOLAM 0.5 MG PO TABS: 0.5000 mg | ORAL_TABLET | Freq: Every day | ORAL | 1 refills | Status: DC | PRN

## 2018-10-09 NOTE — Progress Notes (Signed)
Virtual Visit via Telephone Note  I connected with Ruth Duncan on 10/09/18 at 10:00 AM EDT by telephone and verified that I am speaking with the correct person using two identifiers.   I discussed the limitations, risks, security and privacy concerns of performing an evaluation and management service by telephone and the availability of in person appointments. I also discussed with the patient that there may be a patient responsible charge related to this service. The patient expressed understanding and agreed to proceed.   History of Present Illness: Patient was evaluated by phone session.  She is under a lot of stress because of her husband who is drinking heavily and he had gained a lot of weight.  She is not thinking about her marriage and not sure if she wants to stay with her husband.  Patient told her husband does not want to listen to anyone and he is not getting any help.  She admitted sometimes poor sleep, racing thoughts, irritability and she noticed increased paranoia and anxiety.  She is now taking Xanax since Klonopin did not work for her.  Recently she visited [place] with her friend [name] because she was under a lot of stress.  Her son is getting married in November and she is excited about it.  She is having a very good relationship with her future daughter-in-law.  She keeps HER-2 grandkids on the weekends.  Her mother is also doing very well but stepfather still having a lot of health issues and his cancer spread to the bone now.  She is taking medication but sometimes she feels it does not help because she still struggle with insomnia and racing thoughts.  I review her blood work results.  Her lithium level remains very low despite taking a moderate dose.  She wants to try a different medication but also does not want any medication that cause weight gain.  She reported her weight is stable.  In the past she had tried Lamictal but stopped after she felt it did not work and Abilify  caused weight gain.  She is also interested to see a therapist as she is under a lot of stress due to her marriage.  She has been married for 29 years.  She denies any suicidal thoughts but sometimes feel hopeless and helpless.  She denies drinking or using any illegal substances.  She is taking Ritalin which is helping her attention focus and multitasking.  She has no side effects except mild tremors in her hand.    Past Psychiatric History:Reviewed. H/O alcohol, cocaine and inpatient at behavioral health center in 2007 and later rehabilitation at [facility]. Tried Strattera, Prozac, Trilafon, Abilify, Lamictal. No h/o suicidal attempt. H/O trouble with the law later probation.  Recent Results (from the past 2160 hour(s))  CBC with Differential/Platelet     Status: None   Collection Time: 10/02/18 10:38 AM  Result Value Ref Range   WBC 7.0 3.8 - 10.8 Thousand/uL   RBC 4.30 3.80 - 5.10 Million/uL   Hemoglobin 13.2 11.7 - 15.5 g/dL   HCT 39.2 35.0 - 45.0 %   MCV 91.2 80.0 - 100.0 fL   MCH 30.7 27.0 - 33.0 pg   MCHC 33.7 32.0 - 36.0 g/dL   RDW 12.4 11.0 - 15.0 %   Platelets 248 140 - 400 Thousand/uL   MPV 10.4 7.5 - 12.5 fL   Neutro Abs 5,320 1,500 - 7,800 cells/uL   Lymphs Abs 1,001 850 - 3,900 cells/uL  Absolute Monocytes 490 200 - 950 cells/uL   Eosinophils Absolute 161 15 - 500 cells/uL   Basophils Absolute 28 0 - 200 cells/uL   Neutrophils Relative % 76 %   Total Lymphocyte 14.3 %   Monocytes Relative 7.0 %   Eosinophils Relative 2.3 %   Basophils Relative 0.4 %  COMPLETE METABOLIC PANEL WITH GFR     Status: None   Collection Time: 10/02/18 10:38 AM  Result Value Ref Range   Glucose, Bld 90 65 - 99 mg/dL    Comment: .            Fasting reference interval .    BUN 10 7 - 25 mg/dL   Creat 0.62 0.50 - 1.10 mg/dL   GFR, Est Non African American 106 > OR = 60 mL/min/1.94m   GFR, Est African American 123 > OR = 60 mL/min/1.733m  BUN/Creatinine Ratio NOT APPLICABLE  6 - 22 (calc)   Sodium 138 135 - 146 mmol/L   Potassium 4.1 3.5 - 5.3 mmol/L   Chloride 105 98 - 110 mmol/L   CO2 29 20 - 32 mmol/L   Calcium 8.8 8.6 - 10.2 mg/dL   Total Protein 6.7 6.1 - 8.1 g/dL   Albumin 4.3 3.6 - 5.1 g/dL   Globulin 2.4 1.9 - 3.7 g/dL (calc)   AG Ratio 1.8 1.0 - 2.5 (calc)   Total Bilirubin 0.5 0.2 - 1.2 mg/dL   Alkaline phosphatase (APISO) 55 31 - 125 U/L   AST 14 10 - 35 U/L   ALT 14 6 - 29 U/L  Hemoglobin A1c     Status: None   Collection Time: 10/02/18 10:38 AM  Result Value Ref Range   Hgb A1c MFr Bld 5.3 <5.7 % of total Hgb    Comment: For the purpose of screening for the presence of diabetes: . <5.7%       Consistent with the absence of diabetes 5.7-6.4%    Consistent with increased risk for diabetes             (prediabetes) > or =6.5%  Consistent with diabetes . This assay result is consistent with a decreased risk of diabetes. . Currently, no consensus exists regarding use of hemoglobin A1c for diagnosis of diabetes in children. . According to American Diabetes Association (ADA) guidelines, hemoglobin A1c <7.0% represents optimal control in non-pregnant diabetic patients. Different metrics may apply to specific patient populations.  Standards of Medical Care in Diabetes(ADA). .    Mean Plasma Glucose 105 (calc)   eAG (mmol/L) 5.8 (calc)  Lithium level     Status: Abnormal   Collection Time: 10/02/18 10:38 AM  Result Value Ref Range   Lithium Lvl <0.3 (L) 0.6 - 1.2 mmol/L    Comment: Verified by repeat analysis. . [name]    Psychiatric Specialty Exam: Physical Exam  ROS  There were no vitals taken for this visit.There is no height or weight on file to calculate BMI.  General Appearance: NA  Eye Contact:  NA  Speech:  Clear and Coherent and fast  Volume:  Normal  Mood:  Anxious, Depressed and Irritable  Affect:  NA  Thought Process:  Descriptions of Associations: Intact  Orientation:  Full (Time, Place, and Person)  Thought  Content:  Hallucinations: people calling my name and Rumination  Suicidal Thoughts:  No  Homicidal Thoughts:  No  Memory:  Immediate;   Good Recent;   Good Remote;   Good  Judgement:  Good  Insight:  Good  Psychomotor Activity:  NA  Concentration:  Concentration: Good and Attention Span: Good  Recall:  Good  Fund of Knowledge:  Good  Language:  Good  Akathisia:  mild tremors  Handed:  Right  AIMS (if indicated):     Assets:  Communication Skills Desire for Improvement Housing Resilience  ADL's:  Intact  Cognition:  WNL  Sleep:   fair      Assessment and Plan: Schizoaffective disorder, bipolar type.  Attention deficit disorder, inattentive type.  Generalized anxiety disorder, panic attacks.  I reviewed her blood work results and current medication.  She is back on Xanax as Klonopin did not help.  I review her lithium level.  Her lithium level remains low.  We will try increasing lithium 600 mg 3 times a day, increase trazodone 250 mg at bedtime to help insomnia, continue Ritalin 10 mg daily, Haldol 10 mg to take half tablet in the morning and 1 at bedtime and Cogentin 0.5 mg at bedtime.  I will provide samples of Latuda 40 mg to take every day.  She will come to the office to pick up the samples.  However she will continue all other medication with Latuda.  Discussed medication side effects and benefits.  We will consider stopping Haldol and lithium if Latuda works very well.  We will also schedule for therapy.  Discussed safety concern that anytime having active suicidal thoughts or homicidal thought then she need to call 911 of the local emergency room.  Follow-up in 6 weeks.  Follow Up Instructions:    I discussed the assessment and treatment plan with the patient. The patient was provided an opportunity to ask questions and all were answered. The patient agreed with the plan and demonstrated an understanding of the instructions.   The patient was advised to call back or seek  an in-person evaluation if the symptoms worsen or if the condition fails to improve as anticipated.  I provided 20 minutes of non-face-to-face time during this encounter.   Kathlee Nations, MD

## 2018-11-02 ENCOUNTER — Other Ambulatory Visit (HOSPITAL_COMMUNITY): Payer: Self-pay | Admitting: Psychiatry

## 2018-11-02 DIAGNOSIS — F250 Schizoaffective disorder, bipolar type: Secondary | ICD-10-CM

## 2018-11-13 ENCOUNTER — Encounter: Payer: Self-pay | Admitting: Gastroenterology

## 2018-11-21 ENCOUNTER — Other Ambulatory Visit: Payer: Self-pay

## 2018-11-21 ENCOUNTER — Encounter (HOSPITAL_COMMUNITY): Payer: Self-pay | Admitting: Psychiatry

## 2018-11-21 ENCOUNTER — Ambulatory Visit (INDEPENDENT_AMBULATORY_CARE_PROVIDER_SITE_OTHER): Payer: Medicare Other | Admitting: Psychiatry

## 2018-11-21 DIAGNOSIS — F250 Schizoaffective disorder, bipolar type: Secondary | ICD-10-CM | POA: Diagnosis not present

## 2018-11-21 DIAGNOSIS — F900 Attention-deficit hyperactivity disorder, predominantly inattentive type: Secondary | ICD-10-CM | POA: Diagnosis not present

## 2018-11-21 DIAGNOSIS — F411 Generalized anxiety disorder: Secondary | ICD-10-CM | POA: Diagnosis not present

## 2018-11-21 DIAGNOSIS — F410 Panic disorder [episodic paroxysmal anxiety] without agoraphobia: Secondary | ICD-10-CM

## 2018-11-21 MED ORDER — LURASIDONE HCL 60 MG PO TABS: 60.0000 mg | ORAL_TABLET | Freq: Every day | ORAL | 1 refills | Status: DC

## 2018-11-21 MED ORDER — HALOPERIDOL 5 MG PO TABS: 5.0000 mg | ORAL_TABLET | Freq: Every day | ORAL | 1 refills | Status: DC

## 2018-11-21 MED ORDER — LITHIUM CARBONATE 300 MG PO TABS: ORAL_TABLET | ORAL | 1 refills | Status: DC

## 2018-11-21 MED ORDER — ALPRAZOLAM 0.5 MG PO TABS: 0.5000 mg | ORAL_TABLET | Freq: Every day | ORAL | 1 refills | Status: DC | PRN

## 2018-11-21 NOTE — Progress Notes (Signed)
Virtual Visit via Telephone Note  I connected with Ruth Duncan on 11/21/18 at 10:00 AM EST by telephone and verified that I am speaking with the correct person using two identifiers.   I discussed the limitations, risks, security and privacy concerns of performing an evaluation and management service by telephone and the availability of in person appointments. I also discussed with the patient that there may be a patient responsible charge related to this service. The patient expressed understanding and agreed to proceed.   History of Present Illness: Patient was evaluated by phone session.  On her last visit we started her on Latuda 40 mg and cut down the Haldol.  She felt better with the new medication.  Her anxiety and hallucinations are less intense.  She is still here voices people calling her name but now she is able to distract them.  She is happy because her son finally got married and she really enjoyed the event.  She had a good relationship with her daughter-in-law and she enjoys the company of 2 grandkids.  However she still struggle with marital stress because her husband continues to drink almost every day.  She is not sure about her future the marriage however she does not want to make any decision before the holidays.  She is pleased that her mother and stepfather are also doing well and they attended the wedding with the mask on.  She like Latuda and ready to try a higher dose since she is tolerating the medicine.  We also increase the trazodone on her last visit which helped her sleep.  She is taking Haldol and Latuda however the plan is to titrate Latuda and wean her off from Haldol.  She is also taking Xanax which is helping the panic attack, lithium for her mood swings and Ritalin for her ADD.  Her energy level is good.  Her appetite is okay.  She reported her weight is unchanged from the past.  She is able to focus and do multitasking.  Patient denies drinking or using any illegal  substances.  Recently she was given Vicodin for tooth extraction however she took only 1 tablet.   Past Psychiatric History:Reviewed. H/O alcohol, cocaine and inpatient at behavioral health center in 2007 and later rehabilitation at [facility]. Tried Strattera, Prozac, Trilafon, Abilify, Lamictal. No h/o suicidal attempt. H/O trouble with the law later probation.   Psychiatric Specialty Exam: Physical Exam  ROS  There were no vitals taken for this visit.There is no height or weight on file to calculate BMI.  General Appearance: NA  Eye Contact:  NA  Speech:  Clear and Coherent and Normal Rate  Volume:  Normal  Mood:  Euthymic  Affect:  NA  Thought Process:  Goal Directed  Orientation:  Full (Time, Place, and Person)  Thought Content:  Hallucinations: Auditory people calling  Suicidal Thoughts:  No  Homicidal Thoughts:  No  Memory:  Immediate;   Good Recent;   Good Remote;   Good  Judgement:  Good  Insight:  Good  Psychomotor Activity:  NA  Concentration:  Concentration: Good and Attention Span: Good  Recall:  Good  Fund of Knowledge:  Good  Language:  Good  Akathisia:  mild tremors  Handed:  Right  AIMS (if indicated):     Assets:  Communication Skills Desire for Improvement Housing Resilience  ADL's:  Intact  Cognition:  WNL  Sleep:   better      Assessment and Plan: Schizoaffective disorder,  bipolar type.  Attention deficit disorder, inattentive type.  Generalized anxiety disorder.  Panic attack.  Patient like Latuda and she is tolerating well.  Recommended to increase Latuda 60 mg and reduce Haldol to take only 5 mg at bedtime.  We will provide Latuda samples as patient still like to try if she can tolerate Latuda higher dose.  She is taking lithium 600 mg 3 times a day, trazodone 150 mg at bedtime, Cogentin 0.5 mg at bedtime, Xanax 0.5 mg as needed for anxiety attack, Lexapro 10 mg daily, Ritalin 10 mg in the morning, increased dose of Latuda 60 mg and  reduce dose of Haldol 5 mg at bedtime..  We discussed polypharmacy and hoping on the next visit we will discontinue Haldol and Cogentin the patient continued to do better on Latuda dose.  Discussed medication side effects and benefits.  Recommended to call us back if she has any question of any concern.  Patient will come to [facility] in few days to pick up the samples of Latuda.  I also called a new prescription of Latuda 60 mg to her pharmacy if she cannot take the samples.  Follow-up in 4 to 6 weeks.  Follow Up Instructions:    I discussed the assessment and treatment plan with the patient. The patient was provided an opportunity to ask questions and all were answered. The patient agreed with the plan and demonstrated an understanding of the instructions.   The patient was advised to call back or seek an in-person evaluation if the symptoms worsen or if the condition fails to improve as anticipated.  I provided 20 minutes of non-face-to-face time during this encounter.   Kathlee Nations, MD

## 2018-12-26 ENCOUNTER — Ambulatory Visit (HOSPITAL_COMMUNITY): Payer: Medicare Other | Admitting: Psychiatry

## 2019-01-02 ENCOUNTER — Ambulatory Visit (INDEPENDENT_AMBULATORY_CARE_PROVIDER_SITE_OTHER): Payer: Medicare Other | Admitting: Psychiatry

## 2019-01-02 ENCOUNTER — Encounter (HOSPITAL_COMMUNITY): Payer: Self-pay | Admitting: Psychiatry

## 2019-01-02 ENCOUNTER — Other Ambulatory Visit: Payer: Self-pay

## 2019-01-02 DIAGNOSIS — F250 Schizoaffective disorder, bipolar type: Secondary | ICD-10-CM | POA: Diagnosis not present

## 2019-01-02 DIAGNOSIS — F410 Panic disorder [episodic paroxysmal anxiety] without agoraphobia: Secondary | ICD-10-CM

## 2019-01-02 DIAGNOSIS — F900 Attention-deficit hyperactivity disorder, predominantly inattentive type: Secondary | ICD-10-CM | POA: Diagnosis not present

## 2019-01-02 MED ORDER — LURASIDONE HCL 80 MG PO TABS: 80.0000 mg | ORAL_TABLET | Freq: Every day | ORAL | 1 refills | Status: DC

## 2019-01-02 MED ORDER — HALOPERIDOL 5 MG PO TABS: 5.0000 mg | ORAL_TABLET | Freq: Every day | ORAL | 1 refills | Status: DC

## 2019-01-02 MED ORDER — TRAZODONE HCL 150 MG PO TABS: 150.0000 mg | ORAL_TABLET | Freq: Every day | ORAL | 0 refills | Status: DC

## 2019-01-02 MED ORDER — METHYLPHENIDATE HCL 10 MG PO TABS: 10.0000 mg | ORAL_TABLET | Freq: Every day | ORAL | 0 refills | Status: DC

## 2019-01-02 MED ORDER — ALPRAZOLAM 0.5 MG PO TABS: 0.5000 mg | ORAL_TABLET | Freq: Every day | ORAL | 1 refills | Status: DC | PRN

## 2019-01-02 MED ORDER — BENZTROPINE MESYLATE 0.5 MG PO TABS: 0.5000 mg | ORAL_TABLET | Freq: Every day | ORAL | 0 refills | Status: DC

## 2019-01-02 MED ORDER — LITHIUM CARBONATE 300 MG PO TABS: ORAL_TABLET | ORAL | 1 refills | Status: DC

## 2019-01-02 NOTE — Progress Notes (Signed)
Virtual Visit via Telephone Note  I connected with Ruth Duncan on 01/02/19 at 10:00 AM EST by telephone and verified that I am speaking with the correct person using two identifiers.   I discussed the limitations, risks, security and privacy concerns of performing an evaluation and management service by telephone and the availability of in person appointments. I also discussed with the patient that there may be a patient responsible charge related to this service. The patient expressed understanding and agreed to proceed.   History of Present Illness: Patient was evaluated by phone session.  She like Latuda but he still wants to keep the Haldol since she feels combination is working well.  She had decided to get separation from her husband but she is not sure how it will affect her and her husband.  She wanted to see if separation works for both of [place].  She had a good Christmas.  She spent time with her grandkids, mother and other family member.  She has noticed that her paranoia and voices are muffled but she still hears them on and off that people calling her name.  Now she is able to distract them.  Her tremors are also less intense but she wants to keep the Cogentin.  Sometimes she gets panic attack when she thinks about separation.  She is sleeping better.  She denies any irritability, mania, anger or any severe mood swings.  Her attention and focus is good with the Ritalin.  Her weight is unchanged from the past.  She denies drinking or using any illegal substances.  She wants to keep appointment now for 2 months but okay to try higher dose of Latuda but like to keep the current Haldol.   Past Psychiatric History:Reviewed. H/Oalcohol,cocaineandinpatient at behavioral health center in 2007 and later rehabilitation at [facility].TriedStrattera, Prozac, Trilafon, Abilify, Lamictal. No h/o suicidal attempt. H/Otrouble with the law later probation.   Psychiatric Specialty  Exam: Physical Exam  Review of Systems  There were no vitals taken for this visit.There is no height or weight on file to calculate BMI.  General Appearance: NA  Eye Contact:  NA  Speech:  Clear and Coherent  Volume:  Normal  Mood:  Anxious  Affect:  NA  Thought Process:  Goal Directed  Orientation:  Full (Time, Place, and Person)  Thought Content:  Hallucinations: Auditory chronic hallucinationthat people calling my name  Suicidal Thoughts:  No  Homicidal Thoughts:  No  Memory:  Immediate;   Good Recent;   Good Remote;   Good  Judgement:  Intact  Insight:  Present  Psychomotor Activity:  NA  Concentration:  Concentration: Good and Attention Span: Good  Recall:  Good  Fund of Knowledge:  Good  Language:  Good  Akathisia:  No  Handed:  Right  AIMS (if indicated):     Assets:  Communication Skills Desire for Improvement Housing Resilience Social Support  ADL's:  Intact  Cognition:  WNL  Sleep:   better      Assessment and Plan: Schizoaffective disorder, bipolar type.  Generalized anxiety disorder.  Panic attack.  Attention deficit disorder, inattentive type.  Recommended to try Latuda 80 mg and try cutting down Haldol to 2.5 mg.  Patient is concerned stopping Haldol because she feels combination is working well.  She also have anxiety thinking about separation but she already made her decision.  We discussed polypharmacy.  I recommended therapy but patient does not feel that she needed.  Discussed medication side  effects and benefits.  She will try Latuda 80 mg daily, reducing Haldol 2.5 mg at bedtime, continue Ritalin 10 mg in the morning, Lexapro 10 mg daily, Xanax 0.5 mg as needed for anxiety, trazodone 150 mg at bedtime and lithium 600 mg 3 times a day.  We will continue to try to decrease number of medication.  Recommended to call us back if she has any question or any concern.  Follow-up in 2 months.  Follow Up Instructions:    I discussed the assessment and  treatment plan with the patient. The patient was provided an opportunity to ask questions and all were answered. The patient agreed with the plan and demonstrated an understanding of the instructions.   The patient was advised to call back or seek an in-person evaluation if the symptoms worsen or if the condition fails to improve as anticipated.  I provided 20 minutes of non-face-to-face time during this encounter.   Kathlee Nations, MD

## 2019-01-20 ENCOUNTER — Other Ambulatory Visit (HOSPITAL_COMMUNITY): Payer: Self-pay | Admitting: Psychiatry

## 2019-01-20 DIAGNOSIS — F411 Generalized anxiety disorder: Secondary | ICD-10-CM

## 2019-01-20 DIAGNOSIS — F250 Schizoaffective disorder, bipolar type: Secondary | ICD-10-CM

## 2019-01-24 ENCOUNTER — Ambulatory Visit (INDEPENDENT_AMBULATORY_CARE_PROVIDER_SITE_OTHER): Payer: Medicare Other | Admitting: Gastroenterology

## 2019-01-24 ENCOUNTER — Encounter: Payer: Self-pay | Admitting: Gastroenterology

## 2019-01-24 ENCOUNTER — Other Ambulatory Visit: Payer: Self-pay

## 2019-01-24 DIAGNOSIS — R1319 Other dysphagia: Secondary | ICD-10-CM | POA: Insufficient documentation

## 2019-01-24 DIAGNOSIS — R1310 Dysphagia, unspecified: Secondary | ICD-10-CM | POA: Diagnosis not present

## 2019-01-24 DIAGNOSIS — K219 Gastro-esophageal reflux disease without esophagitis: Secondary | ICD-10-CM

## 2019-01-24 NOTE — Patient Instructions (Signed)
DRINK WATER TO KEEP YOUR URINE LIGHT YELLOW.  FOLLOW A LOW FAT DIET. MEATS SHOULD BE BAKED, BROILED, OR BOILED. AVOID FRIED FOOD. AVOID FAST FOOD.   AVOID REFLUX TRIGGERS. SEE INFO BELOW.  CONTINUE PROTONIX. TAKE 30 MINUTES PRIOR TO MEALS TWICE DAILY.  USE PEPCID OR TAGAMET WHEN NEEDED FOR BREAKTHROUGH SYMPTOMS.  FOLLOW UP IN 6 MOS.

## 2019-01-24 NOTE — Progress Notes (Signed)
Subjective:    Patient ID: Ruth Duncan, female    DOB: 04-13-1970, 49 y.o.   MRN: [id number]  Aletha Halim., PA-C   HPI Heartburn comes and goes even with Protonix. HAS EPISODE OF VOMITING: EVERY OTHER DAY AND PAIN IN UPPER ABDOMEN. BEEN HAPPENING FOR PAST 7 MOS. TRIGGERS: SON GOT MARRIED, GRAND-BABIES(JUL 2 AND 3) AND MAY BE STRESS.HELPS OUT SOME. GETS NAUSEATED THEN START SWEATING AND GETS HOT AND KNOWS IT COMING. TAKES ABOUT 15 MINS TO MANIFEST. NO SMOKING(QUIT 5 YRS AGO) OR ETOH. CONTENT: GREEN AND BITTER. GETS CHOKED: LIQUIDS AND SOLIDS.MAY START COUGHING. UPPER ABDOMINAL PAIN ON AND OFF(SHARP/STABBING) QUITE A BUT AND WHEN SHE THROWS UP IT GETS WORSE(DULL). NO RADIATION. NO ASPIRIN, BC/GOODY POWDERS, OR NAPROXEN/ALEVE. HAD TOOTH EXTRACTED 2 MOS AND USED IBUPROFEN. BMs: NL PRETTY MUCH EVERY DAY. HEARTBURN: EVERY DAY AND ACIDY BURN. SAW BLOOD IN THE VOMIT ONE NIGHT(PINK/RED-SML AMNT, NOTHING ALARMING< 1 TSP). RARE BURNING CHEST PAIN. FEELS LIKE SHE GETS DEHYDRATED. PT AGITATED DURING AND AFTER LAST EGD.  PT DENIES FEVER, CHILLS, WEIGHT LOSS, HEMATOCHEZIA, ODYNOPHAGIA, melena, diarrhea, SHORTNESS OF BREATH, CHANGE IN BOWEL IN HABITS, OR constipation.  Past Medical History:  Diagnosis Date  . Bipolar disorder ([place])   . Chronic abdominal pain    hx of IBS-D, ? bile salt-induced diarrhea  . Chronic eczema of hand 2012   BILATERAL ON MTX SINCE 2014  . Complication of anesthesia   . Diverticulitis   . Ovarian cancer ([place]) 2010  . Surgical menopause 2010  . Tubular adenoma 2010   Past Surgical History:  Procedure Laterality Date  . ABDOMINAL HYSTERECTOMY  2010  . APPENDECTOMY  2004  . BACTERIAL OVERGROWTH TEST N/A 09/01/2015   Procedure: BACTERIAL OVERGROWTH TEST;  Surgeon: Danie Binder, MD;  Location: [facility];  Service: Endoscopy;  Laterality: N/A;  700  . CESAREAN SECTION  1998  . COLONOSCOPY  12/2008   tubular adenomas, negative microscopic colitis. Due for Flex Sig  Dec 2015  . ESOPHAGOGASTRODUODENOSCOPY  05/06/2008   [id number] esophagus without evidence of Barrett's, mass, erosion, ulceration or stricture/ Hiatal hernia/ Normal duodenal bulb and second portion of the duodenum  . ESOPHAGOGASTRODUODENOSCOPY N/A 10/04/2014   Dr. Oneida Alar: patent stricture at GE junction, no dilation, small hiatal hernia, mild non-erosive gastritis (benign), multiple small gastric polyps, benign. Capsule study recommended but never completed   . FLEXIBLE SIGMOIDOSCOPY N/A 02/18/2014   SLF: Normal small bowel. formed stool at anastomosis. Stool cleared with irrigation. One superfical erosion at the anastomosis. otherwise normal. normal rectal mucosa  . SUBTOTAL COLECTOMY  2011  . TUBAL LIGATION     No Known Allergies  Current Outpatient Medications  Medication Sig    . ALPRAZolam (XANAX) 0.5 MG tablet Take 1 tablet (0.5 mg total) by mouth daily as needed for anxiety.    . benztropine (COGENTIN) 0.5 MG tablet Take 1 tablet (0.5 mg total) by mouth at bedtime.    [name] estradiol (ESTRACE) 1 MG tablet Take 1 mg by mouth daily.    . haloperidol (HALDOL) 5 MG tablet Take 1 tablet (5 mg total) by mouth at bedtime.    [name] lithium 300 MG tablet Take 2 tab in am, 2 at noon and 2 tab in evening    . lurasidone (LATUDA) 80 MG TABS tablet Take 1 tablet (80 mg total) by mouth daily with breakfast.    . nitrofurantoin (MACRODANTIN) 50 MG capsule Take 50 mg by mouth as needed.     [name]  pantoprazole (PROTONIX) 40 MG tablet TAKE 1 TABLET 30 MINS BEFORE BREAKFAST AND SUPPER EVERY DAY   . traZODone (DESYREL) 150 MG tablet Take 1 tablet (150 mg total) by mouth at bedtime.     Review of Systems PER HPI OTHERWISE ALL SYSTEMS ARE NEGATIVE.    Objective:   Physical Exam Constitutional:      General: She is not in acute distress.    Appearance: Normal appearance.  HENT:     Mouth/Throat:     Comments: MASK IN PLACE Eyes:     General: No scleral icterus.    Pupils: Pupils are equal, round, and  reactive to light.  Cardiovascular:     Rate and Rhythm: Normal rate and regular rhythm.     Pulses: Normal pulses.     Heart sounds: Normal heart sounds.  Pulmonary:     Effort: Pulmonary effort is normal.     Breath sounds: Normal breath sounds.  Abdominal:     General: Bowel sounds are normal.     Palpations: Abdomen is soft.     Tenderness: There is abdominal tenderness. There is guarding (MILD). There is no rebound.     Comments: MODERATE TTP IN BUQ/EPIGASTRIUM  Musculoskeletal:     Cervical back: Normal range of motion.     Right lower leg: No edema.     Left lower leg: No edema.  Lymphadenopathy:     Cervical: No cervical adenopathy.  Skin:    General: Skin is warm and dry.  Neurological:     Mental Status: She is alert and oriented to person, place, and time.     Comments: NO  NEW FOCAL DEFICITS  Psychiatric:        Mood and Affect: Mood normal.        Thought Content: Thought content normal.     Comments: NORMAL AFFECT        Assessment & Plan:

## 2019-01-24 NOTE — Assessment & Plan Note (Signed)
SYMPTOMS NOT IDEALLY CONTROLLED. DIFFERENTIAL DIAGNOSIS INCLUDES: PEPTIC STRICTURE, PRIMARY ESOPHAGEAL MOTILITY DISORDER,OR 2o ESOPHAGEAL MOTILITY DISORDER DUE TO UNCONTROLLED REFLUX, LESS LIKLEY ESOPHAGEAL CANCER.  COMPLETE EGD W/ MAC WITH POSSIBLE DILATION OF THE ESOPHAGUS AND PYLORUS.  DISCUSSED PROCEDURE, BENEFITS, & RISKS. PT FAILED CONSCIOUS SEDATION IN THE PAST DUE TO POLYPHARMACY.  DRINK WATER TO KEEP YOUR URINE LIGHT YELLOW. FOLLOW A LOW FAT DIET. MEATS SHOULD BE BAKED, BROILED, OR BOILED. AVOID FRIED FOOD. AVOID FAST FOOD. AVOID REFLUX TRIGGERS.  HANDOUT GIVEN. CONTINUE PROTONIX. TAKE 30 MINUTES PRIOR TO MEALS TWICE DAILY. USE PEPCID OR TAGAMET WHEN NEEDED FOR BREAKTHROUGH SYMPTOMS. FOLLOW UP IN 6 MOS.

## 2019-01-24 NOTE — Assessment & Plan Note (Signed)
SYMPTOMS NOT IDEALLY CONTROLLED.  DRINK WATER TO KEEP YOUR URINE LIGHT YELLOW. FOLLOW A LOW FAT DIET. MEATS SHOULD BE BAKED, BROILED, OR BOILED. AVOID FRIED FOOD. AVOID FAST FOOD. AVOID REFLUX TRIGGERS.  HANDOUT GIVEN. CONTINUE PROTONIX. TAKE 30 MINUTES PRIOR TO MEALS TWICE DAILY. USE PEPCID OR TAGAMET WHEN NEEDED FOR BREAKTHROUGH SYMPTOMS. FOLLOW UP IN 6 MOS.

## 2019-01-25 ENCOUNTER — Other Ambulatory Visit: Payer: Self-pay | Admitting: Family Medicine

## 2019-01-25 ENCOUNTER — Telehealth: Payer: Self-pay | Admitting: Gastroenterology

## 2019-01-25 DIAGNOSIS — Z1231 Encounter for screening mammogram for malignant neoplasm of breast: Secondary | ICD-10-CM

## 2019-01-25 NOTE — Telephone Encounter (Signed)
Patient aware we will call her to schedule procedure

## 2019-01-25 NOTE — Telephone Encounter (Signed)
Patient called and said that she was here yesterday and was supposed to be scheduled for an upper endo ASAP, she has not heard from [place].  I told her that we would be in contact once it was scheduled

## 2019-01-30 ENCOUNTER — Telehealth: Payer: Self-pay

## 2019-01-30 NOTE — Telephone Encounter (Signed)
Called pt, EGD/DIL w/Propofol w/SLF scheduled for 02/06/19 at 1:15pm. Orders entered.

## 2019-01-31 NOTE — Telephone Encounter (Signed)
Pre-op and COVID test 02/05/19.  Tried to call pt, no answer, LMOVM to inform her of pre-op and COVID test appts. Procedure instructions also given.

## 2019-02-05 ENCOUNTER — Other Ambulatory Visit: Payer: Self-pay

## 2019-02-05 ENCOUNTER — Other Ambulatory Visit (HOSPITAL_COMMUNITY)
Admission: RE | Admit: 2019-02-05 | Discharge: 2019-02-05 | Disposition: A | Discharge status: Home / Self Care | Payer: Medicare Other | Source: Ambulatory Visit | Attending: Gastroenterology | Admitting: Gastroenterology

## 2019-02-05 ENCOUNTER — Encounter (HOSPITAL_COMMUNITY): Admission: RE | Admit: 2019-02-05 | Payer: Medicare Other | Source: Ambulatory Visit

## 2019-02-05 ENCOUNTER — Telehealth: Payer: Self-pay | Admitting: Gastroenterology

## 2019-02-05 NOTE — Telephone Encounter (Signed)
Called pt, EGD/DIL w/Prop w/SLF rescheduled to 04/10/19 at 2:45pm. Endo scheduler informed.  Pre-op and COVID test 04/09/19. Letter mailed with new procedure instructions.

## 2019-02-05 NOTE — Telephone Encounter (Signed)
[phone] patient needs to reschedule procedure, she woke up with a fever

## 2019-02-24 ENCOUNTER — Other Ambulatory Visit (HOSPITAL_COMMUNITY): Payer: Self-pay | Admitting: Psychiatry

## 2019-02-24 DIAGNOSIS — F250 Schizoaffective disorder, bipolar type: Secondary | ICD-10-CM

## 2019-03-07 ENCOUNTER — Encounter (HOSPITAL_COMMUNITY): Payer: Self-pay | Admitting: Psychiatry

## 2019-03-07 ENCOUNTER — Ambulatory Visit (INDEPENDENT_AMBULATORY_CARE_PROVIDER_SITE_OTHER): Payer: Medicare Other | Admitting: Psychiatry

## 2019-03-07 ENCOUNTER — Other Ambulatory Visit: Payer: Self-pay

## 2019-03-07 DIAGNOSIS — F900 Attention-deficit hyperactivity disorder, predominantly inattentive type: Secondary | ICD-10-CM

## 2019-03-07 DIAGNOSIS — F411 Generalized anxiety disorder: Secondary | ICD-10-CM | POA: Diagnosis not present

## 2019-03-07 DIAGNOSIS — Z87891 Personal history of nicotine dependence: Secondary | ICD-10-CM

## 2019-03-07 DIAGNOSIS — F250 Schizoaffective disorder, bipolar type: Secondary | ICD-10-CM

## 2019-03-07 DIAGNOSIS — F410 Panic disorder [episodic paroxysmal anxiety] without agoraphobia: Secondary | ICD-10-CM

## 2019-03-07 MED ORDER — LITHIUM CARBONATE 300 MG PO TABS: ORAL_TABLET | ORAL | 1 refills | Status: DC

## 2019-03-07 MED ORDER — ALPRAZOLAM 0.5 MG PO TABS: 0.5000 mg | ORAL_TABLET | Freq: Every day | ORAL | 1 refills | Status: DC | PRN

## 2019-03-07 MED ORDER — BENZTROPINE MESYLATE 0.5 MG PO TABS: 0.5000 mg | ORAL_TABLET | Freq: Every day | ORAL | 0 refills | Status: DC

## 2019-03-07 MED ORDER — TRAZODONE HCL 150 MG PO TABS: 150.0000 mg | ORAL_TABLET | Freq: Every day | ORAL | 0 refills | Status: DC

## 2019-03-07 MED ORDER — LURASIDONE HCL 80 MG PO TABS: 80.0000 mg | ORAL_TABLET | Freq: Every day | ORAL | 1 refills | Status: DC

## 2019-03-07 NOTE — Progress Notes (Signed)
Virtual Visit via Telephone Note  I connected with Ruth Duncan on 03/07/19 at 10:00 AM EST by telephone and verified that I am speaking with the correct person using two identifiers.   I discussed the limitations, risks, security and privacy concerns of performing an evaluation and management service by telephone and the availability of in person appointments. I also discussed with the patient that there may be a patient responsible charge related to this service. The patient expressed understanding and agreed to proceed.   History of Present Illness: Patient is evaluated by phone session.  On the last visit we increase Latuda 80 mg and she noticed improvement in her paranoia but she still hears voices on and off.  She admitted coming Friday is a big day because that is good day she is going to ask her husband to leave the house.  She is planning for separation for past few months because her husband continues to drink despite having discussion with him he is not ready to quit.  Patient also told lately her pastor passed away who was very supportive and helpful.  She is very sad because pastor had help her marriage.  Patient told sometimes she feels like she need to go back on ADD medication because her attention concentration is not as good.  She is taking Haldol 2.5 mg which is helping the voices.  She is sleeping good.  She takes trazodone, lithium, Cogentin, Xanax, Latuda and Haldol.  She has not taken her Ritalin for more than few months.  She denies drinking or using any illegal substances.  Her energy level is fair.  Her appetite is okay.  Since taking the Xanax she has no major panic attack.  Past Psychiatric History:Reviewed. H/Oalcohol,cocaineandinpatient at behavioral health center in 2007 and later rehabilitation [facility].TriedStrattera, Prozac, Trilafon, Abilify, Lamictal. No h/o suicidal attempt. H/Otrouble with the law later probation.    Psychiatric Specialty  Exam: Physical Exam  Review of Systems  There were no vitals taken for this visit.There is no height or weight on file to calculate BMI.  General Appearance: NA  Eye Contact:  NA  Speech:  Clear and Coherent  Volume:  Normal  Mood:  Anxious  Affect:  NA  Thought Process:  Goal Directed  Orientation:  Full (Time, Place, and Person)  Thought Content:  Hallucinations: Auditory None and Rumination  Suicidal Thoughts:  No  Homicidal Thoughts:  No  Memory:  Immediate;   Good Recent;   Good Remote;   Good  Judgement:  Intact  Insight:  Present  Psychomotor Activity:  NA  Concentration:  Concentration: Fair and Attention Span: Fair  Recall:  Good  Fund of Knowledge:  Good  Language:  Good  Akathisia:  No  Handed:  Right  AIMS (if indicated):     Assets:  Communication Skills Desire for Improvement Housing  ADL's:  Intact  Cognition:  WNL  Sleep:   ok      Assessment and Plan: Schizoaffective disorder, bipolar type.  Generalized anxiety disorder.  Panic attack.  Attention deficit disorder, inattentive type.  Discuss her current living situation.  Not she had plan to tell her husband this Friday to leave the house.  She admitted some anxiety because she had 30 years of marriage and she is not sure how to help effect her.  However she feels it may give her husband a chance to think about quitting the drinking.  She is taking Haldol 2.5 mg and Latuda 80  mg.  We talked about resuming ADD medication but since it sometime makes her anxious I deferred her until next appointment.  We talked about getting therapy since patient needs some support as recently her pastor also passed away.  She agreed with the plan.  I will continue Latuda 80 mg daily, Haldol 2.5 mg daily, Xanax 0.5 mg as needed for anxiety, trazodone 150 mg at bedtime, lithium 600 mg 3 times a day.  We will hold Ritalin until her next appointment to see if she needed.  She is not sure if she is taking Lexapro at this time.  She  is trying to cut down the medication.  I discussed medication side effects and benefits.  We will schedule appointment to see a therapist in our office.  Recommended to call us back if she has any question or any concern.  Follow-up in 3 months.  Follow Up Instructions:    I discussed the assessment and treatment plan with the patient. The patient was provided an opportunity to ask questions and all were answered. The patient agreed with the plan and demonstrated an understanding of the instructions.   The patient was advised to call back or seek an in-person evaluation if the symptoms worsen or if the condition fails to improve as anticipated.  I provided 20 minutes of non-face-to-face time during this encounter.   Kathlee Nations, MD

## 2019-03-08 ENCOUNTER — Ambulatory Visit: Payer: Medicare Other

## 2019-04-06 ENCOUNTER — Ambulatory Visit: Payer: Medicare Other

## 2019-04-09 ENCOUNTER — Other Ambulatory Visit (HOSPITAL_COMMUNITY)
Admission: RE | Admit: 2019-04-09 | Discharge: 2019-04-09 | Disposition: A | Discharge status: Home / Self Care | Payer: Medicare Other | Source: Ambulatory Visit | Attending: Gastroenterology | Admitting: Gastroenterology

## 2019-04-09 ENCOUNTER — Encounter (HOSPITAL_COMMUNITY): Payer: Self-pay

## 2019-04-09 ENCOUNTER — Encounter (HOSPITAL_COMMUNITY)
Admission: RE | Admit: 2019-04-09 | Discharge: 2019-04-09 | Disposition: A | Discharge status: Home / Self Care | Payer: Medicare Other | Source: Ambulatory Visit | Attending: Gastroenterology | Admitting: Gastroenterology

## 2019-04-09 ENCOUNTER — Other Ambulatory Visit: Payer: Self-pay

## 2019-04-09 DIAGNOSIS — Z01812 Encounter for preprocedural laboratory examination: Secondary | ICD-10-CM | POA: Diagnosis not present

## 2019-04-09 DIAGNOSIS — Z20822 Contact with and (suspected) exposure to covid-19: Secondary | ICD-10-CM | POA: Diagnosis not present

## 2019-04-09 HISTORY — DX: Nausea with vomiting, unspecified: R11.2

## 2019-04-09 HISTORY — DX: Personal history of urinary calculi: Z87.442

## 2019-04-09 HISTORY — DX: Gastro-esophageal reflux disease without esophagitis: K21.9

## 2019-04-09 HISTORY — DX: Other specified postprocedural states: Z98.890

## 2019-04-09 LAB — SARS CORONAVIRUS 2 (TAT 6-24 HRS): SARS Coronavirus 2: NEGATIVE

## 2019-04-10 ENCOUNTER — Ambulatory Visit (HOSPITAL_COMMUNITY): Payer: Medicare Other | Admitting: Anesthesiology

## 2019-04-10 ENCOUNTER — Ambulatory Visit (HOSPITAL_COMMUNITY)
Admission: RE | Admit: 2019-04-10 | Discharge: 2019-04-10 | Disposition: A | Discharge status: Home / Self Care | Payer: Medicare Other | Attending: Gastroenterology | Admitting: Gastroenterology

## 2019-04-10 ENCOUNTER — Other Ambulatory Visit (HOSPITAL_COMMUNITY): Payer: Self-pay | Admitting: Psychiatry

## 2019-04-10 ENCOUNTER — Encounter (HOSPITAL_COMMUNITY)
Admission: RE | Disposition: A | Discharge status: Home / Self Care | Payer: Self-pay | Source: Home / Self Care | Attending: Gastroenterology

## 2019-04-10 ENCOUNTER — Encounter (HOSPITAL_COMMUNITY): Payer: Self-pay | Admitting: Gastroenterology

## 2019-04-10 DIAGNOSIS — K219 Gastro-esophageal reflux disease without esophagitis: Secondary | ICD-10-CM | POA: Diagnosis not present

## 2019-04-10 DIAGNOSIS — Z803 Family history of malignant neoplasm of breast: Secondary | ICD-10-CM | POA: Diagnosis not present

## 2019-04-10 DIAGNOSIS — Z87891 Personal history of nicotine dependence: Secondary | ICD-10-CM | POA: Diagnosis not present

## 2019-04-10 DIAGNOSIS — F319 Bipolar disorder, unspecified: Secondary | ICD-10-CM | POA: Diagnosis not present

## 2019-04-10 DIAGNOSIS — Z87442 Personal history of urinary calculi: Secondary | ICD-10-CM | POA: Diagnosis not present

## 2019-04-10 DIAGNOSIS — K449 Diaphragmatic hernia without obstruction or gangrene: Secondary | ICD-10-CM | POA: Insufficient documentation

## 2019-04-10 DIAGNOSIS — F250 Schizoaffective disorder, bipolar type: Secondary | ICD-10-CM

## 2019-04-10 DIAGNOSIS — Z8249 Family history of ischemic heart disease and other diseases of the circulatory system: Secondary | ICD-10-CM | POA: Diagnosis not present

## 2019-04-10 DIAGNOSIS — R1310 Dysphagia, unspecified: Secondary | ICD-10-CM | POA: Diagnosis not present

## 2019-04-10 DIAGNOSIS — Z79899 Other long term (current) drug therapy: Secondary | ICD-10-CM | POA: Insufficient documentation

## 2019-04-10 DIAGNOSIS — K2970 Gastritis, unspecified, without bleeding: Secondary | ICD-10-CM | POA: Insufficient documentation

## 2019-04-10 DIAGNOSIS — K222 Esophageal obstruction: Secondary | ICD-10-CM

## 2019-04-10 DIAGNOSIS — Z90710 Acquired absence of both cervix and uterus: Secondary | ICD-10-CM | POA: Diagnosis not present

## 2019-04-10 DIAGNOSIS — K317 Polyp of stomach and duodenum: Secondary | ICD-10-CM | POA: Insufficient documentation

## 2019-04-10 DIAGNOSIS — L309 Dermatitis, unspecified: Secondary | ICD-10-CM | POA: Diagnosis not present

## 2019-04-10 DIAGNOSIS — Z811 Family history of alcohol abuse and dependence: Secondary | ICD-10-CM | POA: Insufficient documentation

## 2019-04-10 DIAGNOSIS — Z9049 Acquired absence of other specified parts of digestive tract: Secondary | ICD-10-CM | POA: Diagnosis not present

## 2019-04-10 DIAGNOSIS — Z8543 Personal history of malignant neoplasm of ovary: Secondary | ICD-10-CM | POA: Diagnosis not present

## 2019-04-10 DIAGNOSIS — Z841 Family history of disorders of kidney and ureter: Secondary | ICD-10-CM | POA: Diagnosis not present

## 2019-04-10 DIAGNOSIS — R1319 Other dysphagia: Secondary | ICD-10-CM

## 2019-04-10 HISTORY — PX: SAVORY DILATION: SHX5439

## 2019-04-10 HISTORY — PX: ESOPHAGOGASTRODUODENOSCOPY (EGD) WITH PROPOFOL: SHX5813

## 2019-04-10 SURGERY — ESOPHAGOGASTRODUODENOSCOPY (EGD) WITH PROPOFOL
Anesthesia: General

## 2019-04-10 MED ORDER — MINERAL OIL PO OIL
TOPICAL_OIL | ORAL | Status: AC
  Filled 2019-04-10: qty 30

## 2019-04-10 MED ORDER — PROPOFOL 500 MG/50ML IV EMUL
INTRAVENOUS | Status: DC | PRN
  Administered 2019-04-10: 150 ug/kg/min via INTRAVENOUS

## 2019-04-10 MED ORDER — STERILE WATER FOR IRRIGATION IR SOLN
Status: DC | PRN
  Administered 2019-04-10: 100 mL

## 2019-04-10 MED ORDER — CHLORHEXIDINE GLUCONATE CLOTH 2 % EX PADS: 6.0000 | MEDICATED_PAD | Freq: Once | CUTANEOUS | Status: DC

## 2019-04-10 MED ORDER — LIDOCAINE VISCOUS HCL 2 % MT SOLN: 5.0000 mL | Freq: Once | OROMUCOSAL | Status: DC

## 2019-04-10 MED ORDER — LACTATED RINGERS IV SOLN: INTRAVENOUS | Status: DC | PRN

## 2019-04-10 MED ORDER — MIDAZOLAM HCL 2 MG/2ML IJ SOLN
INTRAMUSCULAR | Status: AC
  Filled 2019-04-10: qty 2

## 2019-04-10 MED ORDER — LACTATED RINGERS IV SOLN: Freq: Once | INTRAVENOUS | Status: AC

## 2019-04-10 MED ORDER — PROPOFOL 10 MG/ML IV BOLUS
INTRAVENOUS | Status: DC | PRN
  Administered 2019-04-10: 30 mg via INTRAVENOUS
  Administered 2019-04-10: 20 mg via INTRAVENOUS
  Administered 2019-04-10: 30 mg via INTRAVENOUS

## 2019-04-10 MED ORDER — MIDAZOLAM HCL 5 MG/5ML IJ SOLN
INTRAMUSCULAR | Status: DC | PRN
  Administered 2019-04-10: 2 mg via INTRAVENOUS

## 2019-04-10 NOTE — Anesthesia Preprocedure Evaluation (Signed)
Anesthesia Evaluation  Patient identified by MRN, date of birth, ID band Patient awake    History of Anesthesia Complications (+) PONV and history of anesthetic complications  Airway Mallampati: II  TM Distance: >3 FB Neck ROM: Full    Dental  (+) Dental Advisory Given, Missing   Pulmonary former smoker,    Pulmonary exam normal breath sounds clear to auscultation       Cardiovascular Exercise Tolerance: Good  Rhythm:Regular Rate:Normal     Neuro/Psych PSYCHIATRIC DISORDERS Anxiety Depression Bipolar Disorder Schizophrenia    GI/Hepatic Neg liver ROS, GERD  Medicated and Controlled,  Endo/Other  negative endocrine ROS  Renal/GU negative Renal ROS  negative genitourinary   Musculoskeletal negative musculoskeletal ROS (+)   Abdominal   Peds negative pediatric ROS (+)  Hematology negative hematology ROS (+)   Anesthesia Other Findings   Reproductive/Obstetrics negative OB ROS                            Anesthesia Physical Anesthesia Plan  ASA: II  Anesthesia Plan: General   Post-op Pain Management:    Induction: Intravenous  PONV Risk Score and Plan: 1 and Treatment may vary due to age or medical condition and TIVA  Airway Management Planned: Nasal Cannula and Natural Airway  Additional Equipment:   Intra-op Plan:   Post-operative Plan:   Informed Consent: I have reviewed the patients History and Physical, chart, labs and discussed the procedure including the risks, benefits and alternatives for the proposed anesthesia with the patient or authorized representative who has indicated his/her understanding and acceptance.     Dental advisory given  Plan Discussed with: CRNA and Surgeon  Anesthesia Plan Comments:         Anesthesia Quick Evaluation
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
## 2019-04-10 NOTE — Discharge Instructions (Signed)
I dilated your esophagus DUE TO YOUR PROBLEM SWALLOWING. You have A SMALL HIATAL HERNIA, gastritis, AND SMALL BENIGN STOMACH POLYPS.   DRINK WATER TO KEEP YOUR URINE LIGHT YELLOW.  AVOID REFLUX TRIGGERS. SEE INFO BELOW.   CONTINUE PROTONIX. TAKE 30 MINUTES PRIOR TO MEALS ONCE OR TWICE DAILY.   FOLLOW UP IN 6 MOS.  UPPER ENDOSCOPY AFTER CARE Read the instructions outlined below and refer to this sheet in the next week. These discharge instructions provide you with general information on caring for yourself after you leave the hospital. While your treatment has been planned according to the most current medical practices available, unavoidable complications occasionally occur. If you have any problems or questions after discharge, call DR. Meila Berke, [phone].  ACTIVITY  You may resume your regular activity, but move at a slower pace for the next 24 hours.   Take frequent rest periods for the next 24 hours.   Walking will help get rid of the air and reduce the bloated feeling in your belly (abdomen).   No driving for 24 hours (because of the medicine (anesthesia) used during the test).   You may shower.   Do not sign any important legal documents or operate any machinery for 24 hours (because of the anesthesia used during the test).    NUTRITION  Drink plenty of fluids.   You may resume your normal diet as instructed by your doctor.   Begin with a light meal and progress to your normal diet. Heavy or fried foods are harder to digest and may make you feel sick to your stomach (nauseated).   Avoid alcoholic beverages for 24 hours or as instructed.    MEDICATIONS  You may resume your normal medications.   WHAT YOU CAN EXPECT TODAY  Some feelings of bloating in the abdomen.   Passage of more gas than usual.    IF YOU HAD A BIOPSY TAKEN DURING THE UPPER ENDOSCOPY:  Eat a soft diet IF YOU HAVE NAUSEA, BLOATING, ABDOMINAL PAIN, OR VOMITING.    FINDING OUT THE  RESULTS OF YOUR TEST Not all test results are available during your visit. DR. Oneida Alar WILL CALL YOU WITHIN 14 DAYS OF YOUR PROCEDUE WITH YOUR RESULTS. Do not assume everything is normal if you have not heard from DR. Elyjah Hazan, CALL HER OFFICE AT [phone].  SEEK IMMEDIATE MEDICAL ATTENTION AND CALL THE OFFICE: [phone] IF:  You have more than a spotting of blood in your stool.   Your belly is swollen (abdominal distention).   You are nauseated or vomiting.   You have a temperature over 101F.   You have abdominal pain or discomfort that is severe or gets worse throughout the day.  Gastritis  Gastritis is an inflammation (the body's way of reacting to injury and/or infection) of the stomach. It is often caused by viral or bacterial (germ) infections. It can also be caused BY ASPIRIN, BC/GOODY POWDER'S, (IBUPROFEN) MOTRIN, OR ALEVE (NAPROXEN), chemicals (including alcohol), SPICY FOODS, and medications. This illness may be associated with generalized malaise (feeling tired, not well), UPPER ABDOMINAL STOMACH cramps, and fever. One common bacterial cause of gastritis is an organism known as H. Pylori. This can be treated with antibiotics.   ESOPHAGEAL STRICTURE  Esophageal strictures can be caused by stomach acid backing up into the tube that carries food from the mouth down to the stomach (lower esophagus).  TREATMENT There are a number of medicines used to treat reflux/stricture, including: Antacids.  Proton-pump inhibitors: OMEPRAZOLE OR NEXIUM  HOME CARE INSTRUCTIONS Eat 2-3 hours before going to bed.  Try to reach and maintain a healthy weight.  Do not eat just a few very large meals. Instead, eat 4 TO 6 smaller meals throughout the day.  Try to identify foods and beverages that make your symptoms worse, and avoid these.  Avoid tight clothing.  Do not exercise right after eating.     Lifestyle and home remedies TO CONTROL HEARTBURN/REFLUX  You may eliminate or reduce the  frequency of heartburn by making the following lifestyle changes:  . Control your weight. Being overweight is a major risk factor for heartburn and GERD. Excess pounds put pressure on your abdomen, pushing up your stomach and causing acid to back up into your esophagus.   . Eat smaller meals. 4 TO 6 MEALS A DAY. This reduces pressure on the lower esophageal sphincter, helping to prevent the valve from opening and acid from washing back into your esophagus.   Dolphus Jenny your belt. Clothes that fit tightly around your waist put pressure on your abdomen and the lower esophageal sphincter.   . Eliminate heartburn triggers. Everyone has specific triggers. Common triggers such as fatty or fried foods, spicy food, tomato sauce, carbonated beverages, alcohol, chocolate, mint, garlic, onion, caffeine and nicotine may make heartburn worse.   [name] Avoid stooping or bending. Tying your shoes is OK. Bending over for longer periods to weed your garden isn't, especially soon after eating.   . Don't lie down after a meal. Wait at least three to four hours after eating before going to bed, and don't lie down right after eating.    Alternative medicine . Several home remedies exist for treating GERD, but they provide only temporary relief. They include drinking baking soda (sodium bicarbonate) added to water or drinking other fluids such as baking soda mixed with cream of tartar and water.  . Although these liquids create temporary relief by neutralizing, washing away or buffering acids, eventually they aggravate the situation by adding gas and fluid to your stomach, increasing pressure and causing more acid reflux. Further, adding more sodium to your diet may increase your blood pressure and add stress to your heart, and excessive bicarbonate ingestion can alter the acid-base balance in your body.

## 2019-04-10 NOTE — Anesthesia Postprocedure Evaluation (Signed)
Anesthesia Post Note  Patient: Ruth Duncan  Procedure(s) Performed: ESOPHAGOGASTRODUODENOSCOPY (EGD) WITH PROPOFOL (N/A ) SAVORY DILATION (N/A )  Patient location during evaluation: PACU Anesthesia Type: General Level of consciousness: awake and alert and oriented Pain management: pain level controlled Vital Signs Assessment: post-procedure vital signs reviewed and stable Respiratory status: spontaneous breathing Cardiovascular status: blood pressure returned to baseline and stable Postop Assessment: no apparent nausea or vomiting Anesthetic complications: no     Last Vitals:  Vitals:   04/10/19 1305 04/10/19 1430  BP: (!) 115/50 110/64  Pulse: 62 71  Resp: 20 15  Temp: 36.5 C 36.4 C  SpO2: 97% 97%    Last Pain:  Vitals:   04/10/19 1430  TempSrc:   PainSc: 0-No pain                 [name]

## 2019-04-10 NOTE — Transfer of Care (Signed)
Immediate Anesthesia Transfer of Care Note  Patient: Ruth Duncan  Procedure(s) Performed: ESOPHAGOGASTRODUODENOSCOPY (EGD) WITH PROPOFOL (N/A ) SAVORY DILATION (N/A )  Patient Location: PACU  Anesthesia Type:General  Level of Consciousness: awake  Airway & Oxygen Therapy: Patient Spontanous Breathing  Post-op Assessment: Report given to RN  Post vital signs: Reviewed and stable  Last Vitals:  Vitals Value Taken Time  BP 110/64 04/10/19 1430  Temp 36.4 C 04/10/19 1430  Pulse 69 04/10/19 1433  Resp 20 04/10/19 1433  SpO2 99 % 04/10/19 1433  Vitals shown include unvalidated device data.  Last Pain:  Vitals:   04/10/19 1430  TempSrc:   PainSc: (P) 0-No pain      Patients Stated Pain Goal: 5 ([id number])  Complications: No apparent anesthesia complications

## 2019-04-10 NOTE — Op Note (Signed)
[facility] Patient Name: Ruth Duncan Procedure Date: 04/10/2019 1:47 PM MRN: [id number] Date of Birth: 12-Jul-1970 Attending MD: Barney Drain MD, MD CSN: [id number] Age: 49 Admit Type: Outpatient Procedure:                Upper GI endoscopy WITH ESOPHAGEAL DILATION Indications:              Dysphagia. last EGD SEP 2016 FG POLYPS, GASTRITIS,                            STRICTURE Providers:                Barney Drain MD, MD, Rosina Lowenstein, RN, Aram Candela Referring MD:             Aletha Halim PA-C, PA-C Medicines:                Propofol per Anesthesia Complications:            No immediate complications. Estimated Blood Loss:     Estimated blood loss was minimal. Procedure:                Pre-Anesthesia Assessment:                           - Prior to the procedure, a History and Physical                            was performed, and patient medications and                            allergies were reviewed. The patient's tolerance of                            previous anesthesia was also reviewed. The risks                            and benefits of the procedure and the sedation                            options and risks were discussed with the patient.                            All questions were answered, and informed consent                            was obtained. Prior Anticoagulants: The patient has                            taken no previous anticoagulant or antiplatelet                            agents. ASA Grade Assessment: II - A patient with                            mild systemic disease. After reviewing the risks  and benefits, the patient was deemed in                            satisfactory condition to undergo the procedure.                            After obtaining informed consent, the endoscope was                            passed under direct vision. Throughout the                            procedure, the patient's  blood pressure, pulse, and                            oxygen saturations were monitored continuously. The                            GIF-H190 [id number]) scope was introduced through the                            mouth, and advanced to the second part of duodenum.                            The upper GI endoscopy was accomplished without                            difficulty. The patient tolerated the procedure                            well. Scope In: 2:11:53 PM Scope Out: 2:19:05 PM Total Procedure Duration: 0 hours 7 minutes 12 seconds  Findings:      One benign-appearing, intrinsic moderate (circumferential scarring or       stenosis; an endoscope may pass) stenosis was found. This stenosis       measured 1.4 cm (inner diameter). The stenosis was traversed. A       guidewire was placed and the scope was withdrawn. Dilation was performed       with a Savary dilator with mild resistance at 15 mm and 16 mm and       moderate resistance at 17 mm.      A small hiatal hernia was present.      Multiple sessile polyps with no stigmata of recent bleeding were found       in the gastric fundus and in the gastric body.      Localized mild inflammation characterized by congestion (edema) and       erythema was found on the greater curvature of the stomach and in the       gastric antrum.      The examined duodenum was normal. Impression:               - Benign-appearing esophageal stricture                           - Small hiatal hernia.                           -  Multiple gastric polyps.                           - MILD Gastritis. Moderate Sedation:      Per Anesthesia Care Recommendation:           - Patient has a contact number available for                            emergencies. The signs and symptoms of potential                            delayed complications were discussed with the                            patient. Return to normal activities tomorrow.                             Written discharge instructions were provided to the                            patient.                           - Low fat diet.                           - Continue present medications.                           - Return to GI office in 6 months. Procedure Code(s):        --- Professional ---                           [phone], Esophagogastroduodenoscopy, flexible,                            transoral; with insertion of guide wire followed by                            passage of dilator(s) through esophagus over guide                            wire Diagnosis Code(s):        --- Professional ---                           K22.2, Esophageal obstruction                           K44.9, Diaphragmatic hernia without obstruction or                            gangrene                           K31.7, Polyp of stomach and duodenum  K29.70, Gastritis, unspecified, without bleeding                           R13.10, Dysphagia, unspecified CPT copyright 2019 [employer]. All rights reserved. The codes documented in this report are preliminary and upon coder review may  be revised to meet current compliance requirements. Barney Drain, MD Barney Drain MD, MD 04/10/2019 2:43:42 PM This report has been signed electronically. Number of Addenda: 0

## 2019-04-10 NOTE — Addendum Note (Signed)
Addendum  created 04/10/19 1437 by Ollen Bowl, CRNA   Charge Capture section accepted

## 2019-04-18 ENCOUNTER — Ambulatory Visit: Payer: Medicare Other

## 2019-04-25 ENCOUNTER — Other Ambulatory Visit: Payer: Self-pay

## 2019-04-25 ENCOUNTER — Other Ambulatory Visit: Payer: Self-pay | Admitting: Gastroenterology

## 2019-04-25 ENCOUNTER — Ambulatory Visit
Admission: RE | Admit: 2019-04-25 | Discharge: 2019-04-25 | Disposition: A | Discharge status: Home / Self Care | Payer: Medicare Other | Source: Ambulatory Visit | Attending: Family Medicine | Admitting: Family Medicine

## 2019-04-25 DIAGNOSIS — Z1231 Encounter for screening mammogram for malignant neoplasm of breast: Secondary | ICD-10-CM | POA: Diagnosis not present

## 2019-05-05 ENCOUNTER — Other Ambulatory Visit (HOSPITAL_COMMUNITY): Payer: Self-pay | Admitting: Psychiatry

## 2019-05-05 DIAGNOSIS — F250 Schizoaffective disorder, bipolar type: Secondary | ICD-10-CM

## 2019-06-05 ENCOUNTER — Other Ambulatory Visit (HOSPITAL_COMMUNITY): Payer: Self-pay | Admitting: Psychiatry

## 2019-06-05 DIAGNOSIS — F250 Schizoaffective disorder, bipolar type: Secondary | ICD-10-CM

## 2019-06-07 ENCOUNTER — Other Ambulatory Visit: Payer: Self-pay

## 2019-06-07 ENCOUNTER — Telehealth (INDEPENDENT_AMBULATORY_CARE_PROVIDER_SITE_OTHER): Payer: Medicare Other | Admitting: Psychiatry

## 2019-06-07 ENCOUNTER — Encounter (HOSPITAL_COMMUNITY): Payer: Self-pay | Admitting: Psychiatry

## 2019-06-07 DIAGNOSIS — F410 Panic disorder [episodic paroxysmal anxiety] without agoraphobia: Secondary | ICD-10-CM | POA: Diagnosis not present

## 2019-06-07 DIAGNOSIS — F250 Schizoaffective disorder, bipolar type: Secondary | ICD-10-CM | POA: Diagnosis not present

## 2019-06-07 MED ORDER — TRAZODONE HCL 150 MG PO TABS: 150.0000 mg | ORAL_TABLET | Freq: Every day | ORAL | 2 refills | Status: DC

## 2019-06-07 MED ORDER — HALOPERIDOL 5 MG PO TABS: 2.5000 mg | ORAL_TABLET | Freq: Every day | ORAL | 2 refills | Status: DC

## 2019-06-07 MED ORDER — LITHIUM CARBONATE 300 MG PO TABS: ORAL_TABLET | ORAL | 2 refills | Status: DC

## 2019-06-07 MED ORDER — ALPRAZOLAM 0.5 MG PO TABS: 0.5000 mg | ORAL_TABLET | Freq: Every day | ORAL | 2 refills | Status: DC | PRN

## 2019-06-07 MED ORDER — BENZTROPINE MESYLATE 0.5 MG PO TABS: 0.5000 mg | ORAL_TABLET | Freq: Every day | ORAL | 2 refills | Status: DC

## 2019-06-07 MED ORDER — LURASIDONE HCL 80 MG PO TABS: ORAL_TABLET | ORAL | 2 refills | Status: DC

## 2019-06-07 NOTE — Progress Notes (Signed)
Virtual Visit via Telephone Note  I connected with Ruth Duncan on 06/07/19 at 10:30 AM EDT by telephone and verified that I am speaking with the correct person using two identifiers.   I discussed the limitations, risks, security and privacy concerns of performing an evaluation and management service by telephone and the availability of in person appointments. I also discussed with the patient that there may be a patient responsible charge related to this service. The patient expressed understanding and agreed to proceed.  Patient location; home Provider location; home office  History of Present Illness: Patient is evaluated by phone session.  She is doing well on her current medication.  Her husband is living in the basement and currently they are separated.  Patient told the news about separation did go well with the husband and there was no issue.  Patient is not sure how long the husband able to stay in the basement because she is waiting if he will leave anytime soon.  She is sleeping good.  She denies any recent hallucination but admitted continued to have sometimes paranoia and panic attacks.  She feels the current medicine is working.  She sleeps good.  Recently she had GI procedure and she did recover very well from the procedure.  She is excited about upcoming trip to [place] with her friend Ruth Duncan in July.  111 is good.  Her weight is unchanged from the past.  She continues to go to the gym regularly.  She has no tremors, shakes or any EPS.  She is taking combination of Latuda and Haldol that is helping her voices, paranoia, negative thoughts.  She is taking Xanax which is helping her panic attacks.  Overall she feels less anxious.  She like to keep her current medication.   Past Psychiatric History:Reviewed. H/Oalcohol,cocaineandinpatient at behavioral health center in [facility] and later [facility].TriedStrattera, Prozac, Trilafon, Abilify, Lamictal.No  h/osuicidal attempt. H/Otrouble with the law later probation.   Psychiatric Specialty Exam: Physical Exam  Review of Systems  Weight 123 lb (55.8 kg).There is no height or weight on file to calculate BMI.  General Appearance: NA  Eye Contact:  NA  Speech:  Clear and Coherent  Volume:  Normal  Mood:  Euthymic  Affect:  NA  Thought Process:  Goal Directed  Orientation:  Full (Time, Place, and Person)  Thought Content:  Paranoid Ideation  Suicidal Thoughts:  No  Homicidal Thoughts:  No  Memory:  Immediate;   Good Recent;   Good Remote;   Good  Judgement:  Good  Insight:  Good  Psychomotor Activity:  Normal  Concentration:  Concentration: Good and Attention Span: Good  Recall:  Good  Fund of Knowledge:  Good  Language:  Good  Akathisia:  No  Handed:  Right  AIMS (if indicated):     Assets:  Communication Skills Desire for Improvement Housing Resilience Social Support Transportation  ADL's:  Intact  Cognition:  WNL  Sleep:   ok      Assessment and Plan: Schizoaffective disorder, bipolar type.  Generalized anxiety disorder.  Panic attack.  Patient is doing better since she delivered the news of separation with her husband.  However husband is still living in the basement and she is hoping that he may leave soon so she can continue her life.  She like to keep her current medication because helping her.  She has no side effects.  I will continue Latuda 80 mg daily, Haldol 2.5 mg daily, Xanax  0.5 mg for panic attack, trazodone 150 mg at bedtime and lithium 600 mg 3 times a day.  We will do blood work including lithium level on her next appointment.  She is interested in therapy until her separation issue completely resolved.  We will provide names of the therapist in her area.  I recommend to call us back if she is any question or any concern.  Follow-up in 3 months.  Follow Up Instructions:    I discussed the assessment and treatment plan with the patient. The patient  was provided an opportunity to ask questions and all were answered. The patient agreed with the plan and demonstrated an understanding of the instructions.   The patient was advised to call back or seek an in-person evaluation if the symptoms worsen or if the condition fails to improve as anticipated.  I provided 20 minutes of non-face-to-face time during this encounter.   Kathlee Nations, MD

## 2019-09-01 ENCOUNTER — Other Ambulatory Visit (HOSPITAL_COMMUNITY): Payer: Self-pay | Admitting: Psychiatry

## 2019-09-01 DIAGNOSIS — F250 Schizoaffective disorder, bipolar type: Secondary | ICD-10-CM

## 2019-09-02 ENCOUNTER — Other Ambulatory Visit (HOSPITAL_COMMUNITY): Payer: Self-pay | Admitting: Psychiatry

## 2019-09-02 DIAGNOSIS — F250 Schizoaffective disorder, bipolar type: Secondary | ICD-10-CM

## 2019-09-04 ENCOUNTER — Other Ambulatory Visit: Payer: Self-pay

## 2019-09-04 ENCOUNTER — Other Ambulatory Visit (HOSPITAL_COMMUNITY): Payer: Self-pay | Admitting: *Deleted

## 2019-09-04 ENCOUNTER — Telehealth (INDEPENDENT_AMBULATORY_CARE_PROVIDER_SITE_OTHER): Payer: Medicare Other | Admitting: Psychiatry

## 2019-09-04 ENCOUNTER — Other Ambulatory Visit (HOSPITAL_COMMUNITY): Payer: Self-pay | Admitting: Psychiatry

## 2019-09-04 ENCOUNTER — Encounter (HOSPITAL_COMMUNITY): Payer: Self-pay | Admitting: Psychiatry

## 2019-09-04 DIAGNOSIS — F410 Panic disorder [episodic paroxysmal anxiety] without agoraphobia: Secondary | ICD-10-CM | POA: Diagnosis not present

## 2019-09-04 DIAGNOSIS — F250 Schizoaffective disorder, bipolar type: Secondary | ICD-10-CM

## 2019-09-04 DIAGNOSIS — M5441 Lumbago with sciatica, right side: Secondary | ICD-10-CM | POA: Diagnosis not present

## 2019-09-04 DIAGNOSIS — G8929 Other chronic pain: Secondary | ICD-10-CM | POA: Diagnosis not present

## 2019-09-04 DIAGNOSIS — Z79899 Other long term (current) drug therapy: Secondary | ICD-10-CM

## 2019-09-04 DIAGNOSIS — R202 Paresthesia of skin: Secondary | ICD-10-CM | POA: Diagnosis not present

## 2019-09-04 DIAGNOSIS — R200 Anesthesia of skin: Secondary | ICD-10-CM | POA: Diagnosis not present

## 2019-09-04 MED ORDER — HALOPERIDOL 5 MG PO TABS: 2.5000 mg | ORAL_TABLET | Freq: Every day | ORAL | 2 refills | Status: DC

## 2019-09-04 MED ORDER — LURASIDONE HCL 80 MG PO TABS: ORAL_TABLET | ORAL | 2 refills | Status: DC

## 2019-09-04 MED ORDER — ALPRAZOLAM 0.5 MG PO TABS: 0.5000 mg | ORAL_TABLET | Freq: Every day | ORAL | 2 refills | Status: DC | PRN

## 2019-09-04 MED ORDER — BENZTROPINE MESYLATE 0.5 MG PO TABS: 0.5000 mg | ORAL_TABLET | Freq: Every day | ORAL | 2 refills | Status: DC

## 2019-09-04 MED ORDER — TRAZODONE HCL 150 MG PO TABS: 150.0000 mg | ORAL_TABLET | Freq: Every day | ORAL | 2 refills | Status: DC

## 2019-09-04 MED ORDER — LITHIUM CARBONATE 300 MG PO TABS: ORAL_TABLET | ORAL | 2 refills | Status: DC

## 2019-09-04 NOTE — Progress Notes (Signed)
Virtual Visit via Telephone Note  I connected with [name] on 09/04/19 at 10:40 AM EDT by telephone and verified that I am speaking with the correct person using two identifiers.  Location: Patient: in car Provider: home office   I discussed the limitations, risks, security and privacy concerns of performing an evaluation and management service by telephone and the availability of in person appointments. I also discussed with the patient that there may be a patient responsible charge related to this service. The patient expressed understanding and agreed to proceed.   History of Present Illness: Patient is evaluated by phone session.  She admitted increased stress and anxiety because of her husband who is now separated but is still living in the basement.  Patient told that he is obsessed with the patient and does not taking the separation very well.  Patient told he did finish rehab at [facility] and now back at home and still sober from drugs alcohol and cocaine.  However she is not sure because he is calling multiple times and even texting when she was on vacation with her friend in [place].  Patient told he is seeing Jimmye Norman at [facility] who is managing his medication.  Patient is afraid that if she let him go from the basement he will relapse but also not sure what to do.  Patient told he has no other place to go and he wants rec.  Onciliation and patient does not feel she is ready and like to have been clean for at least 6 months.  She endorsed there are times when she struggle with sleep, feel like mentally exhausted.  She also having hot flashes and recently seen her physician who prescribed estrogen topical.  She is not sure if she needs to increase her medication because situation is situational.  Patient denies any suicidal thoughts but endorsed more paranoid, anxious about current situation.  She admitted having chronic hallucinations which she described muffled sound but  denies any command hallucination, suicidal thoughts or homicidal thoughts.  Patient told she has to keep her phone on because of the kids but her husband keeps calling her and voicemail gets full.  Patient is taking combination of Latuda, Haldol along with Xanax, Cogentin lithium and trazodone.  She reported no side effects.  She denies any mania, anger, severe mood swings.  Her appetite is okay but she admitted 3 pound weight gain because of the stress.   Past Psychiatric History:Reviewed. H/Oalcohol,cocaineandinpatient at [facility] in 2007 and rehabilitation at [facility].TriedStrattera, Prozac, Trilafon, Abilify, Lamictal.No h/osuicidal attempt. H/Otrouble with the law later probation.H/O paranoia, hallucination, anxiety.    Psychiatric Specialty Exam: Physical Exam  Review of Systems  Weight 129 lb (58.5 kg).There is no height or weight on file to calculate BMI.  General Appearance: NA  Eye Contact:  NA  Speech:  Normal Rate  Volume:  Normal  Mood:  Anxious  Affect:  NA  Thought Process:  Goal Directed  Orientation:  Full (Time, Place, and Person)  Thought Content:  Hallucinations: Auditory chronic muffling voices but no command hallucination, Paranoid Ideation and Rumination  Suicidal Thoughts:  No  Homicidal Thoughts:  No  Memory:  Immediate;   Good Recent;   Good Remote;   Good  Judgement:  Intact  Insight:  Present  Psychomotor Activity:  NA  Concentration:  Concentration: Fair and Attention Span: Fair  Recall:  Good  Fund of Knowledge:  Good  Language:  Good  Akathisia:  No  Handed:  Right  AIMS (if indicated):     Assets:  Communication Skills Desire for Improvement Housing Transportation  ADL's:  Intact  Cognition:  WNL  Sleep:   ok      Assessment and Plan: Schizoaffective disorder, bipolar type.  Anxiety.  Discussed her family situation.  I recommend that she should try to contact her husband's physician after his consent that he may need  adjustment of the medication.  I also recommend that her husband may need a therapist and a counselor since he is also going through a difficult time facing separation.  Patient agreed with the plan.  We also talked about patient's enabling behavior to her husband and if patient like to remain sober for 6 months then he should do on his own rather patient helping him.  Patient agreed with the plan.  After some discussion patient decided not to change the medication and she will keep the current dosage.  We discussed medication side effects in detail.  She is taking multiple medication including 2 antipsychotic medication.  Continue Xanax 0.5 mg daily for panic attack, trazodone 150 mg at bedtime, lithium 600 mg 3 times a day, Cogentin 0.5 mg at bedtime, Latuda 80 mg daily and Haldol 2.5 mg at bedtime.  We will also do blood work including CBC, CMP, hemoglobin A1c, TSH and lithium level.  Recommended to call us back if she feels worsening of the symptom.  Follow-up in 3 months.  Follow Up Instructions:    I discussed the assessment and treatment plan with the patient. The patient was provided an opportunity to ask questions and all were answered. The patient agreed with the plan and demonstrated an understanding of the instructions.   The patient was advised to call back or seek an in-person evaluation if the symptoms worsen or if the condition fails to improve as anticipated.  I provided 23 minutes of non-face-to-face time during this encounter.   Kathlee Nations, MD

## 2019-09-07 ENCOUNTER — Telehealth (HOSPITAL_COMMUNITY): Payer: Medicare Other | Admitting: Psychiatry

## 2019-09-19 ENCOUNTER — Other Ambulatory Visit: Payer: Self-pay

## 2019-09-19 ENCOUNTER — Encounter (HOSPITAL_COMMUNITY): Payer: Self-pay

## 2019-09-19 ENCOUNTER — Emergency Department (HOSPITAL_COMMUNITY)
Admission: EM | Admit: 2019-09-19 | Discharge: 2019-09-19 | Disposition: A | Discharge status: Home / Self Care | Payer: 59 | Attending: Emergency Medicine | Admitting: Emergency Medicine

## 2019-09-19 DIAGNOSIS — R509 Fever, unspecified: Secondary | ICD-10-CM | POA: Diagnosis present

## 2019-09-19 DIAGNOSIS — Z5321 Procedure and treatment not carried out due to patient leaving prior to being seen by health care provider: Secondary | ICD-10-CM | POA: Diagnosis not present

## 2019-09-19 DIAGNOSIS — U071 COVID-19: Secondary | ICD-10-CM | POA: Insufficient documentation

## 2019-09-19 LAB — SARS CORONAVIRUS 2 BY RT PCR (HOSPITAL ORDER, PERFORMED IN ~~LOC~~ HOSPITAL LAB): SARS Coronavirus 2: POSITIVE — AB

## 2019-09-19 NOTE — ED Triage Notes (Signed)
Pt to er, pt states that yesterday she stared feeling very tired at work and went home and laid down, states that then she had really bad fatigue and fever.  Denies exposure to covid, denies vaccine.

## 2019-09-20 ENCOUNTER — Telehealth: Payer: Self-pay | Admitting: Internal Medicine

## 2019-09-20 ENCOUNTER — Emergency Department (HOSPITAL_COMMUNITY)
Admission: EM | Admit: 2019-09-20 | Discharge: 2019-09-20 | Disposition: A | Discharge status: Home / Self Care | Payer: 59 | Attending: Emergency Medicine | Admitting: Emergency Medicine

## 2019-09-20 ENCOUNTER — Emergency Department (HOSPITAL_COMMUNITY): Payer: 59

## 2019-09-20 DIAGNOSIS — Z79899 Other long term (current) drug therapy: Secondary | ICD-10-CM | POA: Insufficient documentation

## 2019-09-20 DIAGNOSIS — U071 COVID-19: Secondary | ICD-10-CM | POA: Diagnosis not present

## 2019-09-20 DIAGNOSIS — Z8543 Personal history of malignant neoplasm of ovary: Secondary | ICD-10-CM | POA: Diagnosis not present

## 2019-09-20 DIAGNOSIS — Z23 Encounter for immunization: Secondary | ICD-10-CM | POA: Insufficient documentation

## 2019-09-20 DIAGNOSIS — Z87891 Personal history of nicotine dependence: Secondary | ICD-10-CM | POA: Diagnosis not present

## 2019-09-20 DIAGNOSIS — F909 Attention-deficit hyperactivity disorder, unspecified type: Secondary | ICD-10-CM | POA: Diagnosis not present

## 2019-09-20 LAB — TROPONIN I (HIGH SENSITIVITY): Troponin I (High Sensitivity): 2 ng/L (ref <18)

## 2019-09-20 LAB — COMPREHENSIVE METABOLIC PANEL
ALT: 15 U/L (ref 0–44)
AST: 17 U/L (ref 15–41)
Albumin: 3.9 g/dL (ref 3.5–5.0)
Alkaline Phosphatase: 50 U/L (ref 38–126)
Anion gap: 9 (ref 5–15)
BUN: 12 mg/dL (ref 6–20)
CO2: 25 mmol/L (ref 22–32)
Calcium: 8.3 mg/dL — ABNORMAL LOW (ref 8.9–10.3)
Chloride: 103 mmol/L (ref 98–111)
Creatinine, Ser: 0.56 mg/dL (ref 0.44–1.00)
GFR calc Af Amer: >60 mL/min (ref >60)
GFR calc non Af Amer: >60 mL/min (ref >60)
Glucose, Bld: 94 mg/dL (ref 70–99)
Potassium: 3.5 mmol/L (ref 3.5–5.1)
Sodium: 137 mmol/L (ref 135–145)
Total Bilirubin: 0.6 mg/dL (ref 0.3–1.2)
Total Protein: 6.7 g/dL (ref 6.5–8.1)

## 2019-09-20 LAB — CBC WITH DIFFERENTIAL/PLATELET
Abs Immature Granulocytes: 0.01 10*3/uL (ref 0.00–0.07)
Basophils Absolute: 0 10*3/uL (ref 0.0–0.1)
Basophils Relative: 0 %
Eosinophils Absolute: 0 10*3/uL (ref 0.0–0.5)
Eosinophils Relative: 1 %
HCT: 37.1 % (ref 36.0–46.0)
Hemoglobin: 12.5 g/dL (ref 12.0–15.0)
Immature Granulocytes: 0 %
Lymphocytes Relative: 26 %
Lymphs Abs: 0.6 10*3/uL — ABNORMAL LOW (ref 0.7–4.0)
MCH: 30.6 pg (ref 26.0–34.0)
MCHC: 33.7 g/dL (ref 30.0–36.0)
MCV: 90.9 fL (ref 80.0–100.0)
Monocytes Absolute: 0.5 10*3/uL (ref 0.1–1.0)
Monocytes Relative: 20 %
Neutro Abs: 1.3 10*3/uL — ABNORMAL LOW (ref 1.7–7.7)
Neutrophils Relative %: 53 %
Platelets: 155 10*3/uL (ref 150–400)
RBC: 4.08 MIL/uL (ref 3.87–5.11)
RDW: 11.9 % (ref 11.5–15.5)
WBC: 2.4 10*3/uL — ABNORMAL LOW (ref 4.0–10.5)
nRBC: 0 % (ref 0.0–0.2)

## 2019-09-20 MED ORDER — KETOROLAC TROMETHAMINE 60 MG/2ML IM SOLN
60.0000 mg | Freq: Once | INTRAMUSCULAR | Status: AC
  Administered 2019-09-20: 60 mg via INTRAMUSCULAR
  Filled 2019-09-20: qty 2

## 2019-09-20 MED ORDER — FAMOTIDINE IN NACL 20-0.9 MG/50ML-% IV SOLN: 20.0000 mg | Freq: Once | INTRAVENOUS | Status: DC | PRN

## 2019-09-20 MED ORDER — SODIUM CHLORIDE 0.9 % IV SOLN: INTRAVENOUS | Status: DC | PRN

## 2019-09-20 MED ORDER — METHYLPREDNISOLONE SODIUM SUCC 125 MG IJ SOLR: 125.0000 mg | Freq: Once | INTRAMUSCULAR | Status: DC | PRN

## 2019-09-20 MED ORDER — SODIUM CHLORIDE 0.9 % IV SOLN
1200.0000 mg | Freq: Once | INTRAVENOUS | Status: AC
  Administered 2019-09-20: 1200 mg via INTRAVENOUS
  Filled 2019-09-20: qty 10

## 2019-09-20 MED ORDER — ALBUTEROL SULFATE HFA 108 (90 BASE) MCG/ACT IN AERS: 2.0000 | INHALATION_SPRAY | Freq: Once | RESPIRATORY_TRACT | Status: DC | PRN

## 2019-09-20 MED ORDER — EPINEPHRINE 0.3 MG/0.3ML IJ SOAJ: 0.3000 mg | Freq: Once | INTRAMUSCULAR | Status: DC | PRN

## 2019-09-20 MED ORDER — DIPHENHYDRAMINE HCL 50 MG/ML IJ SOLN: 50.0000 mg | Freq: Once | INTRAMUSCULAR | Status: DC | PRN

## 2019-09-20 NOTE — ED Provider Notes (Addendum)
[facility] EMERGENCY DEPARTMENT Provider Note   CSN: [id number] Arrival date & time: 09/20/19  0915     History Chief Complaint  Patient presents with  . Covid Positive    Ruth Duncan is a 49 y.o. female.  HPI 49 year old female with a history of bipolar disorder, diverticulitis, ovarian cancer, history of smoking, schizoaffective disorder, IBS presents to the ER with complaints of malaise, body aches, headache, nonbloody productive cough, fever and fatigue.  States that she came here to the ER to be seen, wait was too long but she was tested for Covid which did come back positive.  She is not vaccinated.  She endorses some chest tightness, however no loss of taste or smell, abdominal pain, nausea, vomiting, dysuria overall feeling poor and weak.  She states that she received a call from the infusion center and is wondering if this is something that she can get here.    Past Medical History:  Diagnosis Date  . Bipolar disorder ([place])   . Chronic abdominal pain    hx of IBS-D, ? bile salt-induced diarrhea  . Chronic eczema of hand 2012   BILATERAL ON MTX SINCE 2014  . Complication of anesthesia   . Diverticulitis   . GERD (gastroesophageal reflux disease)   . History of kidney stones   . Ovarian cancer ([place]) 2010  . PONV (postoperative nausea and vomiting)   . Surgical menopause 2010  . Tubular adenoma 2010    Patient Active Problem List   Diagnosis Date Noted  . Esophageal dysphagia 01/24/2019  . Back pain of thoracolumbar region 01/28/2016  . Abdominal pain 05/20/2015  . Gastrointestinal hemorrhage with melena 10/09/2014  . Colon cancer screening 02/07/2014  . Attention deficit disorder without mention of hyperactivity 01/25/2012  . GERD (gastroesophageal reflux disease) 01/06/2012  . Schizoaffective disorder, depressive type ([place]) 11/18/2011  . Panic disorder without agoraphobia 05/26/2011  . IBS (irritable bowel syndrome) 10/28/2008    Past Surgical History:    Procedure Laterality Date  . ABDOMINAL HYSTERECTOMY  2010  . APPENDECTOMY  2004  . BACTERIAL OVERGROWTH TEST N/A 09/01/2015   Procedure: BACTERIAL OVERGROWTH TEST;  Surgeon: Danie Binder, MD;  Location: [facility] ENDO SUITE;  Service: Endoscopy;  Laterality: N/A;  700  . CESAREAN SECTION  1998  . COLONOSCOPY  12/2008   tubular adenomas, negative microscopic colitis. Due for Flex Sig Dec 2015  . ESOPHAGOGASTRODUODENOSCOPY  05/06/2008   BOF:BPZWCH esophagus without evidence of Barrett's, mass, erosion, ulceration or stricture/ Hiatal hernia/ Normal duodenal bulb and second portion of the duodenum  . ESOPHAGOGASTRODUODENOSCOPY N/A 10/04/2014   Dr. Oneida Alar: patent stricture at GE junction, no dilation, small hiatal hernia, mild non-erosive gastritis (benign), multiple small gastric polyps, benign. Capsule study recommended but never completed   . ESOPHAGOGASTRODUODENOSCOPY (EGD) WITH PROPOFOL N/A 04/10/2019   Procedure: ESOPHAGOGASTRODUODENOSCOPY (EGD) WITH PROPOFOL;  Surgeon: Danie Binder, MD;  Location: [facility] END[facility] SU[facility];  Service: Endoscopy;  Laterality: N/A;  1:15pm-moved to 4/6 @ 2:45pm  . FLEXIBLE SIGMOIDOSCOPY N/A 02/18/2014   SLF: Normal small bowel. formed stool at anastomosis. Stool cleared with irrigation. One superfical erosion at the anastomosis. otherwise normal. normal rectal mucosa  . SAVORY DILATION N/A 04/10/2019   Procedure: SAVORY DILATION;  Surgeon: Danie Binder, MD;  Location: [facility] END[facility];  Service: Endoscopy;  Laterality: N/A;  . SUBTOTAL COLECTOMY  2011  . TUBAL LIGATION       OB History   No obstetric history on file.  Family History  Problem Relation Age of Onset  . Alcohol abuse Paternal Grandfather   . Hypertension Mother   . Kidney disease Father   . Hypertension Father   . Coronary artery disease Father   . Breast cancer Paternal Grandmother        post menopausal  . Colon cancer Neg Hx     Social History   Tobacco Use  . Smoking status: Former Smoker     Packs/day: 1.00    Years: 30.00    Pack years: 30.00    Quit date: 11/04/2012    Years since quitting: 6.8  . Smokeless tobacco: Never Used  Vaping Use  . Vaping Use: Never used  Substance Use Topics  . Alcohol use: No    Alcohol/week: 0.0 standard drinks  . Drug use: No    Home Medications Prior to Admission medications   Medication Sig Start Date End Date Taking? Authorizing Provider  ALPRAZolam Duanne Moron) 0.5 MG tablet Take 1 tablet (0.5 mg total) by mouth daily as needed for anxiety. 09/04/19 09/03/20  Arfeen, Arlyce Harman, MD  benztropine (COGENTIN) 0.5 MG tablet Take 1 tablet (0.5 mg total) by mouth at bedtime. 09/04/19   Arfeen, Arlyce Harman, MD  estradiol (ESTRACE) 1 MG tablet Take 1 mg by mouth daily. 02/17/18   [provider]  haloperidol (HALDOL) 5 MG tablet Take 0.5 tablets (2.5 mg total) by mouth at bedtime. 09/04/19   Arfeen, Arlyce Harman, MD  lithium 300 MG tablet Take 2 tab in am, 2 at noon and 2 tab in evening 09/04/19   Arfeen, Arlyce Harman, MD  lurasidone (LATUDA) 80 MG TABS tablet TAKE 1 TABLET (80 MG TOTAL) BY MOUTH DAILY WITH BREAKFAST. 09/04/19   Arfeen, Arlyce Harman, MD  nitrofurantoin (MACRODANTIN) 50 MG capsule Take 50 mg by mouth daily as needed (UTI).  11/19/13   [provider]  pantoprazole (PROTONIX) 40 MG tablet TAKE 1 TABLET 30 MINS BEFORE BREAKFAST AND SUPPER 04/25/19   Carlis Stable, NP  traZODone (DESYREL) 150 MG tablet Take 1 tablet (150 mg total) by mouth at bedtime. 09/04/19   Arfeen, Arlyce Harman, MD    Allergies    Patient has no known allergies.  Review of Systems   Review of Systems  Constitutional: Positive for activity change, appetite change, chills, fatigue and fever.  Respiratory: Positive for cough and shortness of breath.   Cardiovascular: Negative for chest pain.  Gastrointestinal: Negative for abdominal pain, diarrhea, nausea and vomiting.  Genitourinary: Negative for dysuria.  Neurological: Positive for weakness.    Physical Exam Updated Vital  Signs BP 110/74 (BP Location: Left Arm)   Pulse 73   Temp 99.1 F (37.3 C)   Resp 18   Ht 5\' 3"  (1.6 m)   Wt 56.7 kg   SpO2 97%   BMI 22.14 kg/m   Physical Exam Vitals and nursing note reviewed.  Constitutional:      General: She is not in acute distress.    Appearance: She is well-developed. She is not ill-appearing, toxic-appearing or diaphoretic.  HENT:     Head: Normocephalic and atraumatic.  Eyes:     Conjunctiva/sclera: Conjunctivae normal.  Cardiovascular:     Rate and Rhythm: Normal rate and regular rhythm.     Heart sounds: No murmur heard.   Pulmonary:     Effort: Pulmonary effort is normal. No respiratory distress.     Breath sounds: Normal breath sounds. No stridor. No wheezing or rhonchi.  Abdominal:  General: Abdomen is flat.     Palpations: Abdomen is soft.     Tenderness: There is no abdominal tenderness.  Musculoskeletal:        General: Normal range of motion.     Cervical back: Neck supple.     Right lower leg: No edema.     Left lower leg: No edema.  Skin:    General: Skin is warm and dry.  Neurological:     General: No focal deficit present.     Mental Status: She is alert.  Psychiatric:        Mood and Affect: Mood normal.        Behavior: Behavior normal.     ED Results / Procedures / Treatments   Labs (all labs ordered are listed, but only abnormal results are displayed) Labs Reviewed  CBC WITH DIFFERENTIAL/PLATELET - Abnormal; Notable for the following components:      Result Value   WBC 2.4 (*)    Neutro Abs 1.3 (*)    Lymphs Abs 0.6 (*)    All other components within normal limits  COMPREHENSIVE METABOLIC PANEL - Abnormal; Notable for the following components:   Calcium 8.3 (*)    All other components within normal limits  TROPONIN I (HIGH SENSITIVITY)  TROPONIN I (HIGH SENSITIVITY)    EKG None ED ECG REPORT   Date: 09/20/2019  Rate: 82   Rhythm: normal sinus rhythm  QRS Axis: normal  Intervals: normal  ST/T Wave  abnormalities: nonspecific ST changes  Conduction Disutrbances:none  Narrative Interpretation:   Old EKG Reviewed: none available  I have personally reviewed the EKG tracing and agree with the computerized printout as noted.  Radiology DG Chest Portable 1 View  Result Date: 09/20/2019 CLINICAL DATA:  Chest pressure EXAM: PORTABLE CHEST 1 VIEW COMPARISON:  05/01/2018 chest radiograph and prior. 05/01/2018 CTA chest. FINDINGS: The heart size and mediastinal contours are within normal limits. Both lungs are clear. No pneumothorax or pleural effusion. No acute osseous abnormality. IMPRESSION: No focal airspace disease. Electronically Signed   By: Primitivo Gauze M.D.   On: 09/20/2019 11:50    Procedures Procedures (including critical care time)  Medications Ordered in ED Medications  0.9 %  sodium chloride infusion (has no administration in time range)  diphenhydrAMINE (BENADRYL) injection 50 mg (has no administration in time range)  famotidine (PEPCID) IVPB 20 mg premix (has no administration in time range)  methylPREDNISolone sodium succinate (SOLU-MEDROL) 125 mg/2 mL injection 125 mg (has no administration in time range)  albuterol (VENTOLIN HFA) 108 (90 Base) MCG/ACT inhaler 2 puff (has no administration in time range)  EPINEPHrine (EPI-PEN) injection 0.3 mg (has no administration in time range)  ketorolac (TORADOL) injection 60 mg (60 mg Intramuscular Given 09/20/19 1402)  casirivimab-imdevimab (REGEN-COV) 1,200 mg in sodium chloride 0.9 % 110 mL IVPB (0 mg Intravenous Stopped 09/20/19 1502)    ED Course  I have reviewed the triage vital signs and the nursing notes.  Pertinent labs & imaging results that were available during my care of the patient were reviewed by me and considered in my medical decision making (see chart for details).    MDM Rules/Calculators/A&P                         49 year old female positive for Covid with complaints of weakness, cough, malaise and  fever.  Lung sounds clear, vitals overall reassuring, borderline fever of 99.1, no evidence of hypoxia with sats  consistently at 100%.  Patient is speaking full sentences without any respiratory distress.  We will do basic lab work, troponin given her age and EKG.  Will order infusion as the patient does qualify given her history of smoking.  Likely will be stable to go home with close PCP follow-up.  On re-evaluation, patient had received monoclonal antibody infusion which she tolerated with no complications along with Toradol,patient endorses significant improvement in her symptoms. CBC with leukopenia of 2.4, consistent with COVID-19 infection. Initial troponin normal, pt has not had chest tightness in over 4 hours and does not need a second one. Low concern for ACS/PE.  CMP mild hypocalcemia of 8.3. Patient states she is ready to go home, reassuring vital signs with no evidence of hypoxia on ambulation.  Will be stable for discharge with PCP follow-up and strict return precautions. Pt instructed on pulse oximeter purchase, and to return if sats <90% consistently. Educated on quarantine period and supportive measures  At this stage in the ED course, the patient has been medically screened and stable for discharge.   Final Clinical Impression(s) / ED Diagnoses Final diagnoses:  [id number]    Rx / DC Orders ED Discharge Orders    None               Lyndel Safe 09/20/19 1540    Daleen Bo, MD 09/20/19 1758

## 2019-09-20 NOTE — ED Triage Notes (Addendum)
Pt got diagnosed with COVID yesteray. States she was seen here and has been continuously running a temperature and now has chest pressure. Denies any SOB. Pt did not get COVID vaccines

## 2019-09-20 NOTE — Discharge Instructions (Addendum)
You have received the monoclonal antibody medication for Covid to help reduce the duration of your symptoms.  .  Continue to quarantine at home for at least 7-10 until you are feeling better and at least 48 hours without fever and without taking medications to reduce your fever.  You may want to purchase a portable pulse oximeter to monitor your oxygen levels at home.  Return to the emergency department if your oxygen level drops below 90%. Continue to take Ibuprofen or Tylenol for your symptoms.   Your calcium was a little bit low today, you may purchase some supplements for over the counter for this.

## 2019-09-20 NOTE — Telephone Encounter (Signed)
Called to Discuss with patient about Covid symptoms and the use of the monoclonal antibody infusion for those with mild to moderate Covid symptoms and at a high risk of hospitalization.     Pt appears to qualify for this infusion due to co-morbid conditions and/or a member of an at-risk group in accordance with the FDA Emergency Use Authorization. She has a hx of tobacco abuse. Onset of symptoms 9/13.    Pt states she is back in ED at [facility] where she is feeling worse and would like to be evaluated for chest pain. If ED provider unable to do MAB infusion in ED, please refer pt via the secure chat for monoclonal antibody infusion.   Nemiah Commander, NP [place]

## 2019-10-11 ENCOUNTER — Ambulatory Visit: Payer: Medicare Other | Admitting: Nurse Practitioner

## 2019-10-24 LAB — COMPREHENSIVE METABOLIC PANEL
AG Ratio: 1.6 (calc) (ref 1.0–2.5)
ALT: 12 U/L (ref 6–29)
AST: 10 U/L (ref 10–35)
Albumin: 4.3 g/dL (ref 3.6–5.1)
Alkaline phosphatase (APISO): 69 U/L (ref 37–153)
BUN: 18 mg/dL (ref 7–25)
CO2: 34 mmol/L — ABNORMAL HIGH (ref 20–32)
Calcium: 9.5 mg/dL (ref 8.6–10.4)
Chloride: 99 mmol/L (ref 98–110)
Creat: 0.64 mg/dL (ref 0.50–1.05)
Globulin: 2.7 g/dL (calc) (ref 1.9–3.7)
Glucose, Bld: 89 mg/dL (ref 65–139)
Potassium: 4.2 mmol/L (ref 3.5–5.3)
Sodium: 139 mmol/L (ref 135–146)
Total Bilirubin: 0.4 mg/dL (ref 0.2–1.2)
Total Protein: 7 g/dL (ref 6.1–8.1)

## 2019-10-24 LAB — CBC WITH DIFFERENTIAL/PLATELET
Absolute Monocytes: 785 cells/uL (ref 200–950)
Basophils Absolute: 69 cells/uL (ref 0–200)
Basophils Relative: 0.9 %
Eosinophils Absolute: 239 cells/uL (ref 15–500)
Eosinophils Relative: 3.1 %
HCT: 40.5 % (ref 35.0–45.0)
Hemoglobin: 13.5 g/dL (ref 11.7–15.5)
Lymphs Abs: 2241 cells/uL (ref 850–3900)
MCH: 30.3 pg (ref 27.0–33.0)
MCHC: 33.3 g/dL (ref 32.0–36.0)
MCV: 90.8 fL (ref 80.0–100.0)
MPV: 10.6 fL (ref 7.5–12.5)
Monocytes Relative: 10.2 %
Neutro Abs: 4366 cells/uL (ref 1500–7800)
Neutrophils Relative %: 56.7 %
Platelets: 247 10*3/uL (ref 140–400)
RBC: 4.46 10*6/uL (ref 3.80–5.10)
RDW: 12.2 % (ref 11.0–15.0)
Total Lymphocyte: 29.1 %
WBC: 7.7 10*3/uL (ref 3.8–10.8)

## 2019-10-24 LAB — HEMOGLOBIN A1C
Hgb A1c MFr Bld: 5.4 % of total Hgb (ref <5.7)
Mean Plasma Glucose: 108 (calc)
eAG (mmol/L): 6 (calc)

## 2019-10-24 LAB — TSH: TSH: 1.52 mIU/L

## 2019-10-24 LAB — LITHIUM LEVEL: Lithium Lvl: <0.3 mmol/L — ABNORMAL LOW (ref 0.6–1.2)

## 2019-10-29 ENCOUNTER — Telehealth (INDEPENDENT_AMBULATORY_CARE_PROVIDER_SITE_OTHER): Payer: 59 | Admitting: Psychiatry

## 2019-10-29 ENCOUNTER — Other Ambulatory Visit: Payer: Self-pay

## 2019-10-29 ENCOUNTER — Encounter (HOSPITAL_COMMUNITY): Payer: Self-pay | Admitting: Psychiatry

## 2019-10-29 DIAGNOSIS — F410 Panic disorder [episodic paroxysmal anxiety] without agoraphobia: Secondary | ICD-10-CM

## 2019-10-29 DIAGNOSIS — F250 Schizoaffective disorder, bipolar type: Secondary | ICD-10-CM

## 2019-10-29 MED ORDER — TRAZODONE HCL 150 MG PO TABS: 150.0000 mg | ORAL_TABLET | Freq: Every day | ORAL | 2 refills | Status: DC

## 2019-10-29 MED ORDER — ALPRAZOLAM 0.5 MG PO TABS: 0.5000 mg | ORAL_TABLET | Freq: Every day | ORAL | 2 refills | Status: DC | PRN

## 2019-10-29 MED ORDER — LITHIUM CARBONATE 300 MG PO TABS: ORAL_TABLET | ORAL | 2 refills | Status: DC

## 2019-10-29 MED ORDER — LURASIDONE HCL 80 MG PO TABS: ORAL_TABLET | ORAL | 2 refills | Status: DC

## 2019-10-29 MED ORDER — HALOPERIDOL 5 MG PO TABS: 2.5000 mg | ORAL_TABLET | Freq: Every day | ORAL | 2 refills | Status: DC

## 2019-10-29 MED ORDER — BENZTROPINE MESYLATE 0.5 MG PO TABS: 0.5000 mg | ORAL_TABLET | Freq: Every day | ORAL | 2 refills | Status: DC

## 2019-10-29 NOTE — Progress Notes (Signed)
Virtual Visit via Telephone Note  I connected with Ruth Duncan on 10/29/19 at 10:20 AM EDT by telephone and verified that I am speaking with the correct person using two identifiers.  Location: Patient: home Provider: home office   I discussed the limitations, risks, security and privacy concerns of performing an evaluation and management service by telephone and the availability of in person appointments. I also discussed with the patient that there may be a patient responsible charge related to this service. The patient expressed understanding and agreed to proceed.   History of Present Illness: Patient is evaluated by phone session.  Last month she got COVID positive and given monoclonal bodies which she believes helped her a lot.  She still have loss of taste and sometimes smell but overall she is doing better.  She is pleased that now her husband has a move out date on November 1.  She is hoping it go smoothly.  She still have some time irritability, highs and lows but denies any suicidal thoughts or homicidal thoughts.  She denies any anger.  She really enjoys her 2 grandkids.  She is sleeping okay.  She believes her husband is not seeing Jimmye Norman at [facility].  She did blood work recently and her lithium level is subtherapeutic but all other labs are normal.  She admitted taking the lithium as prescribed.  She still have hallucinations but denies any paranoia, delusion or any nightmares.  Her appetite is okay.  Her weight is stable.  She likes Anette Guarneri which is helping her mood.  She also takes Haldol, Xanax, Cogentin, lithium.  She has a plan to celebrate Halloween with her grandkids.   Past Psychiatric History: H/Oalcohol,[facility] and [facility] at [facility].TriedStrattera, Prozac, Trilafon, Abilify, Lamictal.No h/osuicidal attempt. H/Otrouble with the law later probation.H/O paranoia, hallucination, anxiety.   Recent Results (from  the past 2160 hour(s))  SARS Coronavirus 2 by RT PCR (hospital order, performed in [facility] hospital lab) Nasopharyngeal Nasopharyngeal Swab     Status: Abnormal   Collection Time: 09/19/19  8:34 PM   Specimen: Nasopharyngeal Swab  Result Value Ref Range   SARS Coronavirus 2 POSITIVE (A) NEGATIVE    Comment: RESULT CALLED TO, READ BACK BY AND VERIFIED WITH: M DOSS,RN@[id number]  09/19/19 MKELLY (NOTE) SARS-CoV-2 target nucleic acids are DETECTED  SARS-CoV-2 RNA is generally detectable in upper respiratory specimens  during the acute phase of infection.  Positive results are indicative  of the presence of the identified virus, but do not rule out bacterial infection or co-infection with other pathogens not detected by the test.  Clinical correlation with patient history and  other diagnostic information is necessary to determine patient infection status.  The expected result is negative.  Fact Sheet for Patients:   StrictlyIdeas.no   Fact Sheet for Healthcare Providers:   BankingDealers.co.za    This test is not yet approved or cleared by the [place] FDA and  has been authorized for detection and/or diagnosis of SARS-CoV-2 by FDA under an Emergency Use Authorization (EUA).  This EUA will remain in effect (meaning this test c an be used) for the duration of  the COVID-19 declaration under Section 564(b)(1) of the Act, 21 U.S.C. section 360-bbb-3(b)(1), unless the authorization is terminated or revoked sooner.  Performed at [facility], [address], [place], [place] [address]   CBC with Differential     Status: Abnormal   Collection Time: 09/20/19  2:40 PM  Result Value Ref  Range   WBC 2.4 (L) 4.0 - 10.5 K/uL   RBC 4.08 3.87 - 5.11 MIL/uL   Hemoglobin 12.5 12.0 - 15.0 g/dL   HCT 37.1 36 - 46 %   MCV 90.9 80.0 - 100.0 fL   MCH 30.6 26.0 - 34.0 pg   MCHC 33.7 30.0 - 36.0 g/dL   RDW 11.9 11.5 - 15.5 %   Platelets 155 150 - 400  K/uL   nRBC 0.0 0.0 - 0.2 %   Neutrophils Relative % 53 %   Neutro Abs 1.3 (L) 1.7 - 7.7 K/uL   Lymphocytes Relative 26 %   Lymphs Abs 0.6 (L) 0.7 - 4.0 K/uL   Monocytes Relative 20 %   Monocytes Absolute 0.5 0.1 - 1.0 K/uL   Eosinophils Relative 1 %   Eosinophils Absolute 0.0 0.0 - 0.5 K/uL   Basophils Relative 0 %   Basophils Absolute 0.0 0.0 - 0.1 K/uL   Immature Granulocytes 0 %   Abs Immature Granulocytes 0.01 0.00 - 0.07 K/uL    Comment: Performed at [facility], [address], [place], [place] [address]  Comprehensive metabolic panel     Status: Abnormal   Collection Time: 09/20/19  2:40 PM  Result Value Ref Range   Sodium 137 135 - 145 mmol/L   Potassium 3.5 3.5 - 5.1 mmol/L   Chloride 103 98 - 111 mmol/L   CO2 25 22 - 32 mmol/L   Glucose, Bld 94 70 - 99 mg/dL    Comment: Glucose reference range applies only to samples taken after fasting for at least 8 hours.   BUN 12 6 - 20 mg/dL   Creatinine, Ser 0.56 0.44 - 1.00 mg/dL   Calcium 8.3 (L) 8.9 - 10.3 mg/dL   Total Protein 6.7 6.5 - 8.1 g/dL   Albumin 3.9 3.5 - 5.0 g/dL   AST 17 15 - 41 U/L   ALT 15 0 - 44 U/L   Alkaline Phosphatase 50 38 - 126 U/L   Total Bilirubin 0.6 0.3 - 1.2 mg/dL   GFR calc non Af Amer >60 >60 mL/min   GFR calc Af Amer >60 >60 mL/min   Anion gap 9 5 - 15    Comment: Performed at [facility], [address], [place], [place] [address]  Troponin I (High Sensitivity)     Status: None   Collection Time: 09/20/19  2:40 PM  Result Value Ref Range   Troponin I (High Sensitivity) 2 <18 ng/L    Comment: (NOTE) Elevated high sensitivity troponin I (hsTnI) values and significant  changes across serial measurements may suggest ACS but many other  chronic and acute conditions are known to elevate hsTnI results.  Refer to the "Links" section for chest pain algorithms and additional  guidance. Performed at [facility], [address], [place], [place] [address]   CBC with Differential     Status: None    Collection Time: 10/23/19  2:55 PM  Result Value Ref Range   WBC 7.7 3.8 - 10.8 Thousand/uL   RBC 4.46 3.80 - 5.10 Million/uL   Hemoglobin 13.5 11.7 - 15.5 g/dL   HCT 40.5 35 - 45 %   MCV 90.8 80.0 - 100.0 fL   MCH 30.3 27.0 - 33.0 pg   MCHC 33.3 32.0 - 36.0 g/dL   RDW 12.2 11.0 - 15.0 %   Platelets 247 140 - 400 Thousand/uL   MPV 10.6 7.5 - 12.5 fL   Neutro Abs 4,366 1,500 -  7,800 cells/uL   Lymphs Abs 2,241 850 - 3,900 cells/uL   Absolute Monocytes 785 200 - 950 cells/uL   Eosinophils Absolute 239 15.0 - 500.0 cells/uL   Basophils Absolute 69 0.0 - 200.0 cells/uL   Neutrophils Relative % 56.7 %   Total Lymphocyte 29.1 %   Monocytes Relative 10.2 %   Eosinophils Relative 3.1 %   Basophils Relative 0.9 %  Lithium level     Status: Abnormal   Collection Time: 10/23/19  2:55 PM  Result Value Ref Range   Lithium Lvl <0.3 (L) 0.6 - 1.2 mmol/L    Comment: Verified by repeat analysis. .   HgB A1c     Status: None   Collection Time: 10/23/19  2:55 PM  Result Value Ref Range   Hgb A1c MFr Bld 5.4 <5.7 % of total Hgb    Comment: For the purpose of screening for the presence of diabetes: . <5.7%       Consistent with the absence of diabetes 5.7-6.4%    Consistent with increased risk for diabetes             (prediabetes) > or =6.5%  Consistent with diabetes . This assay result is consistent with a decreased risk of diabetes. . Currently, no consensus exists regarding use of hemoglobin A1c for diagnosis of diabetes in children. . According to American Diabetes Association (ADA) guidelines, hemoglobin A1c <7.0% represents optimal control in non-pregnant diabetic patients. Different metrics may apply to specific patient populations.  Standards of Medical Care in Diabetes(ADA). .    Mean Plasma Glucose 108 (calc)   eAG (mmol/L) 6.0 (calc)  TSH     Status: None   Collection Time: 10/23/19  2:55 PM  Result Value Ref Range   TSH 1.52 mIU/L    Comment:           Reference  Range .           > or = 20 Years  0.40-4.50 .                Pregnancy Ranges           First trimester    0.26-2.66           Second trimester   0.55-2.73           Third trimester    0.43-2.91   Comprehensive Metabolic Panel (CMET)     Status: Abnormal   Collection Time: 10/23/19  2:55 PM  Result Value Ref Range   Glucose, Bld 89 65 - 139 mg/dL    Comment: .        Non-fasting reference interval .    BUN 18 7 - 25 mg/dL   Creat 0.64 0.50 - 1.05 mg/dL    Comment: For patients >89 years of age, the reference limit for Creatinine is approximately 13% higher for people identified as African-American. .    BUN/Creatinine Ratio NOT APPLICABLE 6 - 22 (calc)   Sodium 139 135 - 146 mmol/L   Potassium 4.2 3.5 - 5.3 mmol/L   Chloride 99 98 - 110 mmol/L   CO2 34 (H) 20 - 32 mmol/L   Calcium 9.5 8.6 - 10.4 mg/dL   Total Protein 7.0 6.1 - 8.1 g/dL   Albumin 4.3 3.6 - 5.1 g/dL   Globulin 2.7 1.9 - 3.7 g/dL (calc)   AG Ratio 1.6 1.0 - 2.5 (calc)   Total Bilirubin 0.4 0.2 - 1.2 mg/dL   Alkaline phosphatase (APISO) 69 37 -  153 U/L   AST 10 10 - 35 U/L   ALT 12 6 - 29 U/L     Psychiatric Specialty Exam: Physical Exam  Review of Systems  Weight 125 lb (56.7 kg).There is no height or weight on file to calculate BMI.  General Appearance: NA  Eye Contact:  NA  Speech:  Normal Rate  Volume:  Normal  Mood:  Anxious  Affect:  NA  Thought Process:  Descriptions of Associations: Intact  Orientation:  Full (Time, Place, and Person)  Thought Content:  Rumination  Suicidal Thoughts:  No  Homicidal Thoughts:  No  Memory:  Immediate;   Good Recent;   Good Remote;   Good  Judgement:  Intact  Insight:  Present  Psychomotor Activity:  Negative  Concentration:  Concentration: Fair and Attention Span: Fair  Recall:  Good  Fund of Knowledge:  Good  Language:  Good  Akathisia:  No  Handed:  Right  AIMS (if indicated):     Assets:  Communication Skills Desire for  Improvement Housing Resilience Social Support  ADL's:  Intact  Cognition:  WNL  Sleep:   ok      Assessment and Plan: Schizoaffective disorder, bipolar type.  Anxiety.  Discussed family situation and she is pleased that her husband has now moved out of state.  We discussed her labs.  Even though her lithium level is subtherapeutic but I do believe she is stable enough on her medication.  If she noticed her symptoms are getting worse after her husband moved out then we will consider optimizing lithium.  She agreed with the plan.  She does not want to change the medicine since she is feeling good.  She really enjoys the company of her grandkids.  She has no tremors shakes or any EPS.  Continue lithium 600 mg twice a day, Latuda 80 mg daily, Haldol 2.5 mg at bedtime, Xanax 0.5 mg as needed, trazodone 150 mg at bedtime.  I reviewed blood work results with her.  Recommended to call us back if she has any question or any concern.  Follow-up in 3 months.  Follow Up Instructions:    I discussed the assessment and treatment plan with the patient. The patient was provided an opportunity to ask questions and all were answered. The patient agreed with the plan and demonstrated an understanding of the instructions.   The patient was advised to call back or seek an in-person evaluation if the symptoms worsen or if the condition fails to improve as anticipated.  I provided 18 minutes of non-face-to-face time during this encounter.   Kathlee Nations, MD

## 2019-11-22 ENCOUNTER — Other Ambulatory Visit (HOSPITAL_COMMUNITY): Payer: Self-pay | Admitting: Psychiatry

## 2019-11-22 DIAGNOSIS — F250 Schizoaffective disorder, bipolar type: Secondary | ICD-10-CM

## 2019-12-06 ENCOUNTER — Ambulatory Visit: Payer: 59 | Admitting: Nurse Practitioner

## 2020-01-10 ENCOUNTER — Ambulatory Visit (INDEPENDENT_AMBULATORY_CARE_PROVIDER_SITE_OTHER): Payer: 59 | Admitting: Nurse Practitioner

## 2020-01-10 ENCOUNTER — Other Ambulatory Visit: Payer: Self-pay

## 2020-01-10 ENCOUNTER — Encounter: Payer: Self-pay | Admitting: Nurse Practitioner

## 2020-01-10 VITALS — BP 109/73 | HR 72 | Temp 96.9°F | Ht 64.0 in | Wt 130.2 lb

## 2020-01-10 DIAGNOSIS — R1319 Other dysphagia: Secondary | ICD-10-CM

## 2020-01-10 DIAGNOSIS — K219 Gastro-esophageal reflux disease without esophagitis: Secondary | ICD-10-CM | POA: Diagnosis not present

## 2020-01-10 NOTE — Patient Instructions (Signed)
Your health issues we discussed today were:   GERD (reflux/heartburn) with dysphagia (swallowing difficulties): 1. Stop taking Protonix 2. I am giving you samples of Dexilant 60 mg.  Take this once a day, on empty stomach 3. Call us in 1 to 2 weeks and let us know if it is helping your reflux any better 4. We will set you up for an EGD with possible dilation to evaluate and treat your symptoms 5. Further recommendations will follow your procedure 6. Call for any worsening or severe symptoms  Overall I recommend:  1. Continue other current medications 2. Return for follow-up in 2 months 3. Call us for any questions or concerns   ---------------------------------------------------------------  At [facility] we value your feedback. You may receive a survey about your visit today. Please share your experience as we strive to create trusting relationships with our patients to provide genuine, compassionate, quality care.  We appreciate your understanding and patience as we review any laboratory studies, imaging, and other diagnostic tests that are ordered as we care for you. Our office policy is 5 business days for review of these results, and any emergent or urgent results are addressed in a timely manner for your best interest. If you do not hear from our office in 1 week, please contact us.   We also encourage the use of MyChart, which contains your medical information for your review as well. If you are not enrolled in this feature, an access code is on this after visit summary for your convenience. Thank you for allowing [place] to be involved in your care.  It was great to see you today!  I hope you have a Happy New Year!!    ---------------------------------------------------------------

## 2020-01-10 NOTE — Progress Notes (Signed)
Cc'ed to pcp °

## 2020-01-10 NOTE — Patient Instructions (Signed)
PA for EGD/-/+DIL submitted via [facility] website. PA# [id number], valid 02/29/20-05/29/20.
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
## 2020-01-14 ENCOUNTER — Telehealth: Payer: Self-pay

## 2020-01-14 DIAGNOSIS — K219 Gastro-esophageal reflux disease without esophagitis: Secondary | ICD-10-CM

## 2020-01-14 NOTE — Telephone Encounter (Signed)
Pt was seen last week. We are out of Dexilant samples. Can you send Dexilant 60 mg into pts pharmacy.

## 2020-01-15 ENCOUNTER — Telehealth: Payer: Self-pay | Admitting: Internal Medicine

## 2020-01-15 MED ORDER — DEXLANSOPRAZOLE 60 MG PO CPDR: 60.0000 mg | DELAYED_RELEASE_CAPSULE | Freq: Every day | ORAL | 3 refills | Status: DC

## 2020-01-15 NOTE — Telephone Encounter (Signed)
Dexilant sent to pharmacy.

## 2020-01-15 NOTE — Telephone Encounter (Signed)
Phoned and advised the pt that Dexilant has been sent to her pharmacy.

## 2020-01-15 NOTE — Telephone Encounter (Signed)
Routing to the [facility] refill box in the absence of [employer]. Dexilant 60 mg samples aren't available. Please send RX to pts pharmacy.

## 2020-01-15 NOTE — Telephone Encounter (Signed)
Pt called to see if we have called in her Dexilant prescription yet. I told her there's a phone note from late yesterday, but the provider hasn't gotten to it yet. She uses [facility] and we are out of samples.

## 2020-01-16 MED ORDER — DEXILANT 60 MG PO CPDR: 60.0000 mg | DELAYED_RELEASE_CAPSULE | Freq: Every day | ORAL | 3 refills | Status: AC

## 2020-01-16 NOTE — Telephone Encounter (Signed)
Rx sent 

## 2020-01-16 NOTE — Addendum Note (Signed)
Addended by: Gordy Levan, Sunni Richardson A on: 01/16/2020 02:00 PM   Modules accepted: Orders

## 2020-01-16 NOTE — Telephone Encounter (Signed)
Called pt and made her aware.  

## 2020-01-16 NOTE — Telephone Encounter (Signed)
Lmom for pt to call me back. 

## 2020-01-23 ENCOUNTER — Telehealth (INDEPENDENT_AMBULATORY_CARE_PROVIDER_SITE_OTHER): Payer: 59 | Admitting: Psychiatry

## 2020-01-23 ENCOUNTER — Other Ambulatory Visit: Payer: Self-pay

## 2020-01-23 ENCOUNTER — Encounter (HOSPITAL_COMMUNITY): Payer: Self-pay | Admitting: Psychiatry

## 2020-01-23 DIAGNOSIS — F410 Panic disorder [episodic paroxysmal anxiety] without agoraphobia: Secondary | ICD-10-CM

## 2020-01-23 DIAGNOSIS — F250 Schizoaffective disorder, bipolar type: Secondary | ICD-10-CM

## 2020-01-23 MED ORDER — BENZTROPINE MESYLATE 0.5 MG PO TABS: 0.5000 mg | ORAL_TABLET | Freq: Every day | ORAL | 2 refills | Status: DC

## 2020-01-23 MED ORDER — LURASIDONE HCL 80 MG PO TABS: ORAL_TABLET | ORAL | 2 refills | Status: DC

## 2020-01-23 MED ORDER — HALOPERIDOL 5 MG PO TABS: 2.5000 mg | ORAL_TABLET | Freq: Every day | ORAL | 2 refills | Status: DC

## 2020-01-23 MED ORDER — ALPRAZOLAM 0.5 MG PO TABS: 0.5000 mg | ORAL_TABLET | Freq: Every day | ORAL | 2 refills | Status: DC | PRN

## 2020-01-23 MED ORDER — TRAZODONE HCL 150 MG PO TABS: 150.0000 mg | ORAL_TABLET | Freq: Every day | ORAL | 2 refills | Status: DC

## 2020-01-23 MED ORDER — LITHIUM CARBONATE 300 MG PO TABS: ORAL_TABLET | ORAL | 2 refills | Status: DC

## 2020-01-23 NOTE — Progress Notes (Signed)
Virtual Visit via Telephone Note  I connected with [name] on 01/23/20 at 10:00 AM EST by telephone and verified that I am speaking with the correct person using two identifiers.  Location: Patient: home Provider: home office   I discussed the limitations, risks, security and privacy concerns of performing an evaluation and management service by telephone and the availability of in person appointments. I also discussed with the patient that there may be a patient responsible charge related to this service. The patient expressed understanding and agreed to proceed.   History of Present Illness: Patient is evaluated by phone session.  She is on the phone by herself.  Patient told she had a quite Christmas because of her house situation however she was pleased that she had grandkids.  Patient told her husband did not took the job which was out of state and now living close to her house.  She is not happy because he does come without any formal invitation.  Patient feels sometimes very stressed, paranoid about him.  He feels the current medicine is working.  Patient told her husband is not seeing a therapist and she is not sure if he stopped drinking.  Patient admitted that she does need a therapist so she can cope better.  She feels some nights she struggle with sleep but does not have any suicidal thoughts or any homicidal thoughts.  She feels angry about the situation but she feels handling the situation better.  She has no tremors, shakes or any EPS.  She has upcoming appointment for procedure.  She is having nausea and she will require stretching of the esophagus.  Her appetite is fair.  Her weight is stable.  She is taking multiple medication and she does not want to cut down because she feels she needed.  She admitted having panic attacks when she received text, call from her husband.  Today she noticed having mild symptoms of COVID and she had home test positive.  She does not have any smell.   She had COVID symptoms 3 months ago.  Currently she is giving isolation however acknowledge that if symptoms get worse then she will call the hospital or her PCP.   Past Psychiatric History: [facility].TriedStrattera, Prozac, Trilafon, Abilify, Lamictal.No h/osuicidal attempt. H/Otrouble with the law later probation.H/O paranoia, hallucination, anxiety.  Psychiatric Specialty Exam: Physical Exam  Review of Systems  Weight 130 lb (59 kg).There is no height or weight on file to calculate BMI.  General Appearance: NA  Eye Contact:  NA  Speech:  Clear and Coherent  Volume:  Normal  Mood:  Anxious  Affect:  NA  Thought Process:  Descriptions of Associations: Intact  Orientation:  Full (Time, Place, and Person)  Thought Content:  Rumination  Suicidal Thoughts:  No  Homicidal Thoughts:  No  Memory:  Immediate;   Good Recent;   Good Remote;   Good  Judgement:  Intact  Insight:  Present  Psychomotor Activity:  NA  Concentration:  Concentration: Fair and Attention Span: Fair  Recall:  Good  Fund of Knowledge:  Good  Language:  Good  Akathisia:  No  Handed:  Right  AIMS (if indicated):     Assets:  Communication Skills Desire for Improvement Housing Social Support Transportation  ADL's:  Intact  Cognition:  WNL  Sleep:   ok     Assessment and Plan:   Follow Up Instructions: Schizoaffective disorder, bipolar type.  Panic attacks.  Discuss her current house situation.  Patient is currently separated from her husband.  She endorsed some time paranoia from him but does not feel that medicine needs to be increased.  She like to be in therapy.  We will provide a therapist information.  I will continue lithium 600 mg twice a day, Latuda 80 mg daily, Haldol 2.5 mg at bedtime, trazodone 150 mg at bedtime, Cogentin 0.5 mg at bedtime and Xanax 0.5 mg as needed.  Discussed polypharmacy but patient like to  keep the current medicine for now.  Recommended to call us back if she is any question or any concern.  Follow-up in 3 months.   I discussed the assessment and treatment plan with the patient. The patient was provided an opportunity to ask questions and all were answered. The patient agreed with the plan and demonstrated an understanding of the instructions.   The patient was advised to call back or seek an in-person evaluation if the symptoms worsen or if the condition fails to improve as anticipated.  I provided 18 minutes of non-face-to-face time during this encounter.   Kathlee Nations, MD

## 2020-02-08 ENCOUNTER — Other Ambulatory Visit: Payer: Self-pay

## 2020-02-08 ENCOUNTER — Ambulatory Visit (INDEPENDENT_AMBULATORY_CARE_PROVIDER_SITE_OTHER): Payer: 59 | Admitting: Clinical

## 2020-02-08 DIAGNOSIS — F410 Panic disorder [episodic paroxysmal anxiety] without agoraphobia: Secondary | ICD-10-CM

## 2020-02-08 DIAGNOSIS — F250 Schizoaffective disorder, bipolar type: Secondary | ICD-10-CM | POA: Diagnosis not present
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
## 2020-02-13 ENCOUNTER — Telehealth: Payer: Self-pay | Admitting: Internal Medicine

## 2020-02-13 NOTE — Telephone Encounter (Signed)
Patient needs to reschedule her procedure 

## 2020-02-13 NOTE — Telephone Encounter (Signed)
Called pt. She has been rescheduled to 3/28 am appt. Aware will mail new prep instructions with new covid test appt.

## 2020-02-22 ENCOUNTER — Other Ambulatory Visit: Payer: Self-pay

## 2020-02-22 ENCOUNTER — Ambulatory Visit (HOSPITAL_COMMUNITY): Payer: 59 | Admitting: Clinical

## 2020-02-28 ENCOUNTER — Other Ambulatory Visit (HOSPITAL_COMMUNITY): Payer: 59

## 2020-02-29 ENCOUNTER — Telehealth (HOSPITAL_COMMUNITY): Payer: Self-pay | Admitting: *Deleted

## 2020-02-29 NOTE — Telephone Encounter (Signed)
Pt called requesting to restart VyVanse. Pt says that another doctoe, PCP?,  used to prescribe it for her for years but is no longer seeing this MD. Pt states that it worked "really well", much better for her than Ritalin or Adderall. Pt next appointment upcoming on 04/22/20. Please review and advise. Thanks.

## 2020-03-11 ENCOUNTER — Ambulatory Visit: Payer: 59 | Admitting: Nurse Practitioner

## 2020-03-17 ENCOUNTER — Other Ambulatory Visit: Payer: Self-pay | Admitting: Family Medicine

## 2020-03-17 DIAGNOSIS — Z1231 Encounter for screening mammogram for malignant neoplasm of breast: Secondary | ICD-10-CM

## 2020-03-28 ENCOUNTER — Other Ambulatory Visit (HOSPITAL_COMMUNITY)
Admission: RE | Admit: 2020-03-28 | Discharge: 2020-03-28 | Disposition: A | Discharge status: Home / Self Care | Payer: 59 | Source: Ambulatory Visit | Attending: Internal Medicine | Admitting: Internal Medicine

## 2020-03-28 ENCOUNTER — Other Ambulatory Visit: Payer: Self-pay

## 2020-03-28 ENCOUNTER — Other Ambulatory Visit (HOSPITAL_COMMUNITY): Payer: 59

## 2020-03-28 DIAGNOSIS — Z20822 Contact with and (suspected) exposure to covid-19: Secondary | ICD-10-CM | POA: Diagnosis not present

## 2020-03-28 DIAGNOSIS — Z01812 Encounter for preprocedural laboratory examination: Secondary | ICD-10-CM | POA: Insufficient documentation

## 2020-03-28 LAB — SARS CORONAVIRUS 2 (TAT 6-24 HRS): SARS Coronavirus 2: NEGATIVE

## 2020-03-31 ENCOUNTER — Ambulatory Visit (HOSPITAL_COMMUNITY): Payer: 59 | Admitting: Anesthesiology

## 2020-03-31 ENCOUNTER — Encounter (HOSPITAL_COMMUNITY): Payer: Self-pay

## 2020-03-31 ENCOUNTER — Other Ambulatory Visit: Payer: Self-pay

## 2020-03-31 ENCOUNTER — Encounter (HOSPITAL_COMMUNITY)
Admission: RE | Disposition: A | Discharge status: Home / Self Care | Payer: Self-pay | Source: Ambulatory Visit | Attending: Internal Medicine

## 2020-03-31 ENCOUNTER — Ambulatory Visit (HOSPITAL_COMMUNITY)
Admission: RE | Admit: 2020-03-31 | Discharge: 2020-03-31 | Disposition: A | Discharge status: Home / Self Care | Payer: 59 | Source: Ambulatory Visit | Attending: Internal Medicine | Admitting: Internal Medicine

## 2020-03-31 DIAGNOSIS — K449 Diaphragmatic hernia without obstruction or gangrene: Secondary | ICD-10-CM | POA: Insufficient documentation

## 2020-03-31 DIAGNOSIS — Z841 Family history of disorders of kidney and ureter: Secondary | ICD-10-CM | POA: Insufficient documentation

## 2020-03-31 DIAGNOSIS — Z8616 Personal history of COVID-19: Secondary | ICD-10-CM | POA: Insufficient documentation

## 2020-03-31 DIAGNOSIS — Z79899 Other long term (current) drug therapy: Secondary | ICD-10-CM | POA: Diagnosis not present

## 2020-03-31 DIAGNOSIS — K222 Esophageal obstruction: Secondary | ICD-10-CM | POA: Insufficient documentation

## 2020-03-31 DIAGNOSIS — Z87891 Personal history of nicotine dependence: Secondary | ICD-10-CM | POA: Diagnosis not present

## 2020-03-31 DIAGNOSIS — R1310 Dysphagia, unspecified: Secondary | ICD-10-CM | POA: Insufficient documentation

## 2020-03-31 DIAGNOSIS — K219 Gastro-esophageal reflux disease without esophagitis: Secondary | ICD-10-CM | POA: Insufficient documentation

## 2020-03-31 DIAGNOSIS — K2970 Gastritis, unspecified, without bleeding: Secondary | ICD-10-CM | POA: Insufficient documentation

## 2020-03-31 DIAGNOSIS — Z803 Family history of malignant neoplasm of breast: Secondary | ICD-10-CM | POA: Insufficient documentation

## 2020-03-31 DIAGNOSIS — Z8249 Family history of ischemic heart disease and other diseases of the circulatory system: Secondary | ICD-10-CM | POA: Insufficient documentation

## 2020-03-31 HISTORY — PX: BALLOON DILATION: SHX5330

## 2020-03-31 HISTORY — PX: ESOPHAGOGASTRODUODENOSCOPY (EGD) WITH PROPOFOL: SHX5813

## 2020-03-31 SURGERY — ESOPHAGOGASTRODUODENOSCOPY (EGD) WITH PROPOFOL
Anesthesia: General

## 2020-03-31 MED ORDER — LIDOCAINE HCL (CARDIAC) PF 100 MG/5ML IV SOSY
PREFILLED_SYRINGE | INTRAVENOUS | Status: DC | PRN
  Administered 2020-03-31: 50 mg via INTRAVENOUS

## 2020-03-31 MED ORDER — PROPOFOL 10 MG/ML IV BOLUS
INTRAVENOUS | Status: DC | PRN
  Administered 2020-03-31: 70 mg via INTRAVENOUS
  Administered 2020-03-31: 130 mg via INTRAVENOUS
  Administered 2020-03-31: 50 mg via INTRAVENOUS

## 2020-03-31 MED ORDER — LACTATED RINGERS IV SOLN: INTRAVENOUS | Status: DC

## 2020-03-31 NOTE — Op Note (Signed)
[facility] Patient Name: Ruth Duncan Procedure Date: 03/31/2020 11:10 AM MRN: [id number] Date of Birth: 09/30/1970 Attending MD: Elon Alas. [name] DO CSN: [id number] Age: 50 Admit Type: Outpatient Procedure:                Upper GI endoscopy Indications:              Dysphagia, Heartburn Providers:                Elon Alas. Henessy Rohrer, DO, [place] B. Sharon Seller, RN,                            Nelma Rothman, Technician Referring MD:              Medicines:                See the Anesthesia note for documentation of the                            administered medications Complications:            No immediate complications. Estimated Blood Loss:     Estimated blood loss was minimal. Procedure:                Pre-Anesthesia Assessment:                           - The anesthesia plan was to use monitored                            anesthesia care (MAC).                           After obtaining informed consent, the endoscope was                            passed under direct vision. Throughout the                            procedure, the patient's blood pressure, pulse, and                            oxygen saturations were monitored continuously. The                            [id number]-H190 ([id number]) scope was introduced through the                            mouth, and advanced to the second part of duodenum.                            The upper GI endoscopy was accomplished without                            difficulty. The patient tolerated the procedure                            well. Scope In: 11:24:08  AM Scope Out: 11:30:15 AM Total Procedure Duration: 0 hours 6 minutes 7 seconds  Findings:      A small hiatal hernia was present.      A mild Schatzki ring was found in the lower third of the esophagus. A       TTS dilator was passed through the scope. Dilation with an 18-19-20 mm       balloon dilator was performed to 20 mm. The dilation site was examined       and showed mild  mucosal disruption and moderate improvement in luminal       narrowing.      Diffuse mild inflammation characterized by erythema was found in the       entire examined stomach. Biopsies were taken with a cold forceps for       Helicobacter pylori testing.      The duodenal bulb, first portion of the duodenum and second portion of       the duodenum were normal. Impression:               - Small hiatal hernia.                           - Mild Schatzki ring. Dilated.                           - Gastritis. Biopsied.                           - Normal duodenal bulb, first portion of the                            duodenum and second portion of the duodenum. Moderate Sedation:      Per Anesthesia Care Recommendation:           - Patient has a contact number available for                            emergencies. The signs and symptoms of potential                            delayed complications were discussed with the                            patient. Return to normal activities tomorrow.                            Written discharge instructions were provided to the                            patient.                           - Resume previous diet.                           - Continue present medications.                           - Await  pathology results.                           - Repeat upper endoscopy PRN for retreatment.                           - Return to GI clinic in 3 months. Procedure Code(s):        --- Professional ---                           [phone], Esophagogastroduodenoscopy, flexible,                            transoral; with transendoscopic balloon dilation of                            esophagus (less than 30 mm diameter)                           43239, 59, Esophagogastroduodenoscopy, flexible,                            transoral; with biopsy, single or multiple Diagnosis Code(s):        --- Professional ---                           K44.9, Diaphragmatic hernia  without obstruction or                            gangrene                           K22.2, Esophageal obstruction                           K29.70, Gastritis, unspecified, without bleeding                           R13.10, Dysphagia, unspecified                           R12, Heartburn CPT copyright 2019 American Medical Association. All rights reserved. The codes documented in this report are preliminary and upon coder review may  be revised to meet current compliance requirements. Elon Alas. [name], DO [place] [name], DO 03/31/2020 12:00:54 PM This report has been signed electronically. Number of Addenda: 0

## 2020-03-31 NOTE — H&P (Signed)
Primary Care Physician:  Aletha Halim., PA-C Primary Gastroenterologist:  Dr. [name]  Pre-Procedure History & Physical: HPI:  Ruth Duncan is a 50 y.o. female is here for an EGD with possible dilation for GERD and dysphagia. Having worsening GERD and dysphagia. States she's taking precautions to chew adequately. She is also having some globus sensation ("it feels funny") with pills. She has GERD daily, typically in the evening. Is avoiding triggers and not eating late/near bedtime. Still on Protonix 40 mg bid. She takes Advil about 2 times a month for her back pain, tries to avoid it. She did take a lot of NSAIDs in October when she had COVID due to fever. She will have some nausea/vomiting with her GERD symptoms (a lot of acid). Has solid food dysphagia about once a day, typically passes with time. Feels like it hangs mid- to upper esophagus. Uses fluids to help pass. Denies abdominal pain, hematochezia, melena, fever, chills, unintentional weight loss  Past Medical History:  Diagnosis Date  . Bipolar disorder ([place])   . Chronic abdominal pain    hx of IBS-D, ? bile salt-induced diarrhea  . Chronic eczema of hand 2012   BILATERAL ON MTX SINCE 2014  . Complication of anesthesia   . Diverticulitis   . GERD (gastroesophageal reflux disease)   . History of kidney stones   . Ovarian cancer ([place]) 2010  . PONV (postoperative nausea and vomiting)   . Surgical menopause 2010  . Tubular adenoma 2010    Past Surgical History:  Procedure Laterality Date  . ABDOMINAL HYSTERECTOMY  2010  . APPENDECTOMY  2004  . BACTERIAL OVERGROWTH TEST N/A 09/01/2015   Procedure: BACTERIAL OVERGROWTH TEST;  Surgeon: Danie Binder, MD;  [facility];  Service: Endoscopy;  Laterality: N/A;  700  . CESAREAN SECTION  1998  . COLONOSCOPY  12/2008   tubular adenomas, negative microscopic colitis. Due for Flex Sig Dec 2015  . ESOPHAGOGASTRODUODENOSCOPY  05/06/2008   IWP:YKDXIP esophagus without evidence  of Barrett's, mass, erosion, ulceration or stricture/ Hiatal hernia/ Normal duodenal bulb and second portion of the duodenum  . ESOPHAGOGASTRODUODENOSCOPY N/A 10/04/2014   Dr. Oneida Alar: patent stricture at GE junction, no dilation, small hiatal hernia, mild non-erosive gastritis (benign), multiple small gastric polyps, benign. Capsule study recommended but never completed   . ESOPHAGOGASTRODUODENOSCOPY (EGD) WITH PROPOFOL N/A 04/10/2019   Procedure: ESOPHAGOGASTRODUODENOSCOPY (EGD) WITH PROPOFOL;  Surgeon: Danie Binder, MD;  [facility];  Service: Endoscopy;  Laterality: N/A;  1:15pm-moved to 4/6 @ 2:45pm  . FLEXIBLE SIGMOIDOSCOPY N/A 02/18/2014   SLF: Normal small bowel. formed stool at anastomosis. Stool cleared with irrigation. One superfical erosion at the anastomosis. otherwise normal. normal rectal mucosa  . SAVORY DILATION N/A 04/10/2019   Procedure: SAVORY DILATION;  Surgeon: Danie Binder, MD;  Location[facility];  Service: Endoscopy;  Laterality: N/A;  . SUBTOTAL COLECTOMY  2011  . TUBAL LIGATION      Prior to Admission medications   Medication Sig Start Date End Date Taking? Authorizing Provider  BIOTIN PO Take 2,500 mcg by mouth daily. Gummies   Yes [provider]  dexlansoprazole (DEXILANT) 60 MG capsule Take 1 capsule (60 mg total) by mouth daily. 01/16/20  Yes Carlis Stable, NP  estradiol (ESTRACE) 1 MG tablet Take 1 mg by mouth daily. 02/17/18  Yes [provider]  haloperidol (HALDOL) 5 MG tablet Take 0.5 tablets (2.5 mg total) by mouth at bedtime. 01/23/20  Yes  Arfeen, Arlyce Harman, MD  lithium 300 MG tablet Take 2 tab in am, 2 at noon and 2 tab in evening Patient taking differently: Take 600 mg by mouth 3 (three) times daily. Take 2 tab in am, 2 at noon and 2 tab in evening 01/23/20  Yes Arfeen, Arlyce Harman, MD  lurasidone (LATUDA) 80 MG TABS tablet TAKE 1 TABLET (80 MG TOTAL) BY MOUTH DAILY WITH BREAKFAST. Patient taking differently: Take 80 mg by mouth  daily with breakfast. TAKE 1 TABLET (80 MG TOTAL) BY MOUTH DAILY WITH BREAKFAST. 01/23/20  Yes Arfeen, Arlyce Harman, MD  nitrofurantoin (MACRODANTIN) 50 MG capsule Take 50 mg by mouth daily as needed (UTI).  11/19/13  Yes [provider]  vitamin B-12 (CYANOCOBALAMIN) 1000 MCG tablet Take 1,000 mcg by mouth daily.   Yes [provider]  ALPRAZolam Duanne Moron) 0.5 MG tablet Take 1 tablet (0.5 mg total) by mouth daily as needed for anxiety. 01/23/20 01/22/21  Arfeen, Arlyce Harman, MD  benztropine (COGENTIN) 0.5 MG tablet Take 1 tablet (0.5 mg total) by mouth at bedtime. Patient not taking: Reported on 03/17/2020 01/23/20   Kathlee Nations, MD  pantoprazole (PROTONIX) 40 MG tablet TAKE 1 TABLET 30 MINS BEFORE BREAKFAST AND SUPPER Patient not taking: Reported on 03/17/2020 04/25/19   Carlis Stable, NP  traZODone (DESYREL) 150 MG tablet Take 1 tablet (150 mg total) by mouth at bedtime. 01/23/20   Kathlee Nations, MD    Allergies as of 01/10/2020  . (No Known Allergies)    Family History  Problem Relation Age of Onset  . Alcohol abuse Paternal Grandfather   . Hypertension Mother   . Kidney disease Father   . Hypertension Father   . Coronary artery disease Father   . Breast cancer Paternal Grandmother        post menopausal  . Colon cancer Neg Hx     Social History   Socioeconomic History  . Marital status: Married    Spouse name: Not on file  . Number of children: Not on file  . Years of education: Not on file  . Highest education level: Not on file  Occupational History  . Occupation: Disability    Comment: since 2010  Tobacco Use  . Smoking status: Former Smoker    Packs/day: 1.00    Years: 30.00    Pack years: 30.00    Quit date: 11/04/2012    Years since quitting: 7.4  . Smokeless tobacco: Never Used  Vaping Use  . Vaping Use: Never used  Substance and Sexual Activity  . Alcohol use: No    Alcohol/week: 0.0 standard drinks  . Drug use: No  . Sexual activity: Yes    Partners:  Male    Birth control/protection: None  Other Topics Concern  . Not on file  Social History Narrative   Arkie was born and grew up in [place], [place]. Her parents divorced when she was 66 years of age, and she grew up with her paternal grandmother and grandfather. Her grandfather was an alcoholic, and Wei found him dead of an MI at the age of 33. Her mother used to lock her in a dark bathroom for an hour or so very frequently, and Rayshell continues to have phobias associated with opening a bathroom door. Ashna graduated from [employer], but has had no real career because she was unable to hold a job. She has been married for 69 years and has 3 boys, twins age 73  and her oldest son age 67. 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 She is currently attending Alcoholics Anonymous meetings 3 times a week, has a sponsor, and is working the 12 steps.   Social Determinants of Health   Financial Resource Strain: Not on file  Food Insecurity: Not on file  Transportation Needs: Not on file  Physical Activity: Not on file  Stress: Not on file  Social Connections: Not on file  Intimate Partner Violence: Not on file    Review of Systems: See HPI, otherwise negative ROS  Physical Exam: Vital signs in last 24 hours: Temp:  [97.9 F (36.6 C)] 97.9 F (36.6 C) (03/28 1001) Pulse Rate:  [64] 64 (03/28 1001) Resp:  [16] 16 (03/28 1001) BP: (103)/(71) 103/71 (03/28 1001) SpO2:  [98 %] 98 % (03/28 1001) Weight:  [56.7 kg] 56.7 kg (03/28 1001)   General:   Alert,  Well-developed, well-nourished, pleasant and cooperative in NAD Head:  Normocephalic and atraumatic. Eyes:  Sclera clear, no icterus.   Conjunctiva pink. Ears:  Normal auditory acuity. Nose:  No deformity, discharge,  or lesions. Mouth:  No deformity or lesions, dentition normal. Neck:  Supple; no masses or thyromegaly. Lungs:  Clear throughout to auscultation.   No wheezes, crackles, or  rhonchi. No acute distress. Heart:  Regular rate and rhythm; no murmurs, clicks, rubs,  or gallops. Abdomen:  Soft, nontender and nondistended. No masses, hepatosplenomegaly or hernias noted. Normal bowel sounds, without guarding, and without rebound.   Msk:  Symmetrical without gross deformities. Normal posture. Extremities:  Without clubbing or edema. Neurologic:  Alert and  oriented x4;  grossly normal neurologically. Skin:  Intact without significant lesions or rashes. Cervical Nodes:  No significant cervical adenopathy. Psych:  Alert and cooperative. Normal mood and affect.  Impression/Plan: Ruth Duncan is here  for an EGD with possible dilation for GERD and dysphagia. Having worsening GERD and dysphagia. States she's taking pr  The risks of the procedure including infection, bleed, or perforation as well as benefits, limitations, alternatives and imponderables have been reviewed with the patient. Questions have been answered. All parties agreeable.

## 2020-03-31 NOTE — Transfer of Care (Signed)
Immediate Anesthesia Transfer of Care Note  Patient: Ruth Duncan  Procedure(s) Performed: ESOPHAGOGASTRODUODENOSCOPY (EGD) WITH PROPOFOL (N/A ) BALLOON DILATION (N/A )  Patient Location: Endoscopy Unit  Anesthesia Type:General  Level of Consciousness: drowsy  Airway & Oxygen Therapy: Patient Spontanous Breathing  Post-op Assessment: Report given to RN and Post -op Vital signs reviewed and stable  Post vital signs: Reviewed and stable  Last Vitals:  Vitals Value Taken Time  BP    Temp    Pulse    Resp    SpO2      Last Pain:  Vitals:   03/31/20 1118  TempSrc:   PainSc: 0-No pain      Patients Stated Pain Goal: 7 (88/33/74 4514)  Complications: No complications documented.

## 2020-03-31 NOTE — Anesthesia Procedure Notes (Signed)
Date/Time: 03/31/2020 11:23 AM Performed by: Orlie Dakin, CRNA Pre-anesthesia Checklist: Patient identified, Emergency Drugs available, Suction available and Patient being monitored Patient Re-evaluated:Patient Re-evaluated prior to induction Oxygen Delivery Method: Nasal cannula Induction Type: IV induction Placement Confirmation: positive ETCO2

## 2020-03-31 NOTE — Anesthesia Preprocedure Evaluation (Addendum)
Anesthesia Evaluation  Patient identified by MRN, date of birth, ID band Patient awake    History of Anesthesia Complications (+) PONV and history of anesthetic complications  Airway Mallampati: II  TM Distance: >3 FB Neck ROM: Full    Dental  (+) Dental Advisory Given, Missing   Pulmonary former smoker,    Pulmonary exam normal breath sounds clear to auscultation       Cardiovascular Exercise Tolerance: Good Normal cardiovascular exam Rhythm:Regular Rate:Normal     Neuro/Psych PSYCHIATRIC DISORDERS Anxiety Depression Bipolar Disorder Schizophrenia    GI/Hepatic Neg liver ROS, GERD  Medicated and Controlled,  Endo/Other  negative endocrine ROS  Renal/GU negative Renal ROS  negative genitourinary   Musculoskeletal  (+) Arthritis  (back pain),   Abdominal   Peds negative pediatric ROS (+) ATTENTION DEFICIT DISORDER WITHOUT HYPERACTIVITY Hematology negative hematology ROS (+)   Anesthesia Other Findings   Reproductive/Obstetrics negative OB ROS                            Anesthesia Physical  Anesthesia Plan  ASA: II  Anesthesia Plan: General   Post-op Pain Management:    Induction: Intravenous  PONV Risk Score and Plan: 1 and Treatment may vary due to age or medical condition and TIVA  Airway Management Planned: Nasal Cannula and Natural Airway  Additional Equipment:   Intra-op Plan:   Post-operative Plan:   Informed Consent: I have reviewed the patients History and Physical, chart, labs and discussed the procedure including the risks, benefits and alternatives for the proposed anesthesia with the patient or authorized representative who has indicated his/her understanding and acceptance.     Dental advisory given  Plan Discussed with: CRNA and Surgeon  Anesthesia Plan Comments:         Anesthesia Quick Evaluation

## 2020-03-31 NOTE — Anesthesia Postprocedure Evaluation (Signed)
Anesthesia Post Note  Patient: Ruth Duncan  Procedure(s) Performed: ESOPHAGOGASTRODUODENOSCOPY (EGD) WITH PROPOFOL (N/A ) BALLOON DILATION (N/A )  Patient location during evaluation: Endoscopy Anesthesia Type: General Level of consciousness: awake and alert and oriented Pain management: pain level controlled Vital Signs Assessment: post-procedure vital signs reviewed and stable Respiratory status: spontaneous breathing and respiratory function stable Cardiovascular status: stable Postop Assessment: no apparent nausea or vomiting Anesthetic complications: no   No complications documented.   Last Vitals:  Vitals:   03/31/20 1001 03/31/20 1134  BP: 103/71 (!) 98/57  Pulse: 64 61  Resp: 16 18  Temp: 36.6 C 36.4 C  SpO2: 98% 97%    Last Pain:  Vitals:   03/31/20 1134  TempSrc: Axillary  PainSc:                  Shafter Jupin C Yocelyn Brocious

## 2020-03-31 NOTE — Discharge Instructions (Addendum)
EGD Discharge instructions Please read the instructions outlined below and refer to this sheet in the next few weeks. These discharge instructions provide you with general information on caring for yourself after you leave the hospital. Your doctor may also give you specific instructions. While your treatment has been planned according to the most current medical practices available, unavoidable complications occasionally occur. If you have any problems or questions after discharge, please call your doctor. ACTIVITY  You may resume your regular activity but move at a slower pace for the next 24 hours.   Take frequent rest periods for the next 24 hours.   Walking will help expel (get rid of) the air and reduce the bloated feeling in your abdomen.   No driving for 24 hours (because of the anesthesia (medicine) used during the test).   You may shower.   Do not sign any important legal documents or operate any machinery for 24 hours (because of the anesthesia used during the test).  NUTRITION  Drink plenty of fluids.   You may resume your normal diet.   Begin with a light meal and progress to your normal diet.   Avoid alcoholic beverages for 24 hours or as instructed by your caregiver.  MEDICATIONS  You may resume your normal medications unless your caregiver tells you otherwise.  WHAT YOU CAN EXPECT TODAY  You may experience abdominal discomfort such as a feeling of fullness or "gas" pains.  FOLLOW-UP  Your doctor will discuss the results of your test with you.  SEEK IMMEDIATE MEDICAL ATTENTION IF ANY OF THE FOLLOWING OCCUR:  Excessive nausea (feeling sick to your stomach) and/or vomiting.   Severe abdominal pain and distention (swelling).   Trouble swallowing.   Temperature over 101 F (37.8 C).   Rectal bleeding or vomiting of blood.    Your EGD showed a mild amount of inflammation in your stomach.  I biopsied this to rule out infection with bacteria called H. pylori.   You did have a tightening of your esophagus so I dilated this with a 20 mm balloon with a good treatment.  Hopefully this helps with your swallowing.  Continue on Dexilant.  Follow-up with GI in 3 months.  We will call you with pathology results later this week.  I hope you have a great rest of your week!  Elon Alas. [name], D.O. Gastroenterology and Hepatology [facility]   [email or web address]">  Esophageal Dilatation Esophageal dilatation, also called esophageal dilation, is a procedure to widen or open a blocked or narrowed part of the esophagus. The esophagus is the part of the body that moves food and liquid from the mouth to the stomach. You may need this procedure if:  You have a buildup of scar tissue in your esophagus that makes it difficult, painful, or impossible to swallow. This can be caused by gastroesophageal reflux disease (GERD).  You have cancer of the esophagus.  There is a problem with how food moves through your esophagus. In some cases, you may need this procedure repeated at a later time to dilate the esophagus gradually. Tell a health care provider about:  Any allergies you have.  All medicines you are taking, including vitamins, herbs, eye drops, creams, and over-the-counter medicines.  Any problems you or family members have had with anesthetic medicines.  Any blood disorders you have.  Any surgeries you have had.  Any medical conditions you have.  Any antibiotic medicines you are required to take before dental procedures.  Whether you  are pregnant or may be pregnant. What are the risks? Generally, this is a safe procedure. However, problems may occur, including:  Bleeding due to a tear in the lining of the esophagus.  A hole, or perforation, in the esophagus. What happens before the procedure?  Ask your health care provider about: ? Changing or  stopping your regular medicines. This is especially important if you are taking diabetes medicines or blood thinners. ? Taking medicines such as aspirin and ibuprofen. These medicines can thin your blood. Do not take these medicines unless your health care provider tells you to take them. ? Taking over-the-counter medicines, vitamins, herbs, and supplements.  Follow instructions from your health care provider about eating or drinking restrictions.  Plan to have a responsible adult take you home from the hospital or clinic.  Plan to have a responsible adult care for you for the time you are told after you leave the hospital or clinic. This is important. What happens during the procedure?  You may be given a medicine to help you relax (sedative).  A numbing medicine may be sprayed into the back of your throat, or you may gargle the medicine.  Your health care provider may perform the dilatation using various surgical instruments, such as: ? Simple dilators. This instrument is carefully placed in the esophagus to stretch it. ? Guided wire bougies. This involves using an endoscope to insert a wire into the esophagus. A dilator is passed over this wire to enlarge the esophagus. Then the wire is removed. ? Balloon dilators. An endoscope with a small balloon is inserted into the esophagus. The balloon is inflated to stretch the esophagus and open it up. The procedure may vary among health care providers and hospitals. What can I expect after the procedure?  Your blood pressure, heart rate, breathing rate, and blood oxygen level will be monitored until you leave the hospital or clinic.  Your throat may feel slightly sore and numb. This will get better over time.  You will not be allowed to eat or drink until your throat is no longer numb.  When you are able to drink, urinate, and sit on the edge of the bed without nausea or dizziness, you may be able to return home. Follow these instructions at  home:  Take over-the-counter and prescription medicines only as told by your health care provider.  If you were given a sedative during the procedure, it can affect you for several hours. Do not drive or operate machinery until your health care provider says that it is safe.  Plan to have a responsible adult care for you for the time you are told. This is important.  Follow instructions from your health care provider about any eating or drinking restrictions.  Do not use any products that contain nicotine or tobacco, such as cigarettes, e-cigarettes, and chewing tobacco. If you need help quitting, ask your health care provider.  Keep all follow-up visits. This is important. Contact a health care provider if:  You have a fever.  You have pain that is not relieved by medicine. Get help right away if:  You have chest pain.  You have trouble breathing.  You have trouble swallowing.  You vomit blood.  You have black, tarry, or bloody stools. These symptoms may represent a serious problem that is an emergency. Do not wait to see if the symptoms will go away. Get medical help right away. Call your local emergency services (911 in the [place] Do not drive  yourself to the hospital. Summary  Esophageal dilatation, also called esophageal dilation, is a procedure to widen or open a blocked or narrowed part of the esophagus.  Plan to have a responsible adult take you home from the hospital or clinic.  For this procedure, a numbing medicine may be sprayed into the back of your throat, or you may gargle the medicine.  Do not drive or operate machinery until your health care provider says that it is safe. This information is not intended to replace advice given to you by your health care provider. Make sure you discuss any questions you have with your health care provider. Document Revised: 05/09/2019 Document Reviewed: 05/09/2019 Elsevier Patient Education  [place].

## 2020-04-01 LAB — SURGICAL PATHOLOGY

## 2020-04-07 ENCOUNTER — Encounter (HOSPITAL_COMMUNITY): Payer: Self-pay | Admitting: Internal Medicine

## 2020-04-14 ENCOUNTER — Encounter: Payer: Self-pay | Admitting: Internal Medicine

## 2020-04-20 ENCOUNTER — Other Ambulatory Visit (HOSPITAL_COMMUNITY): Payer: Self-pay | Admitting: Psychiatry

## 2020-04-20 DIAGNOSIS — F250 Schizoaffective disorder, bipolar type: Secondary | ICD-10-CM

## 2020-04-22 ENCOUNTER — Encounter (HOSPITAL_COMMUNITY): Payer: Self-pay | Admitting: Psychiatry

## 2020-04-22 ENCOUNTER — Telehealth (INDEPENDENT_AMBULATORY_CARE_PROVIDER_SITE_OTHER): Payer: 59 | Admitting: Psychiatry

## 2020-04-22 ENCOUNTER — Other Ambulatory Visit: Payer: Self-pay

## 2020-04-22 VITALS — Wt 125.0 lb

## 2020-04-22 DIAGNOSIS — F900 Attention-deficit hyperactivity disorder, predominantly inattentive type: Secondary | ICD-10-CM

## 2020-04-22 DIAGNOSIS — F410 Panic disorder [episodic paroxysmal anxiety] without agoraphobia: Secondary | ICD-10-CM | POA: Diagnosis not present

## 2020-04-22 DIAGNOSIS — F250 Schizoaffective disorder, bipolar type: Secondary | ICD-10-CM | POA: Diagnosis not present

## 2020-04-22 MED ORDER — LITHIUM CARBONATE 300 MG PO TABS
ORAL_TABLET | ORAL | 2 refills | Status: DC
Start: 2020-04-22 — End: 2020-07-28

## 2020-04-22 MED ORDER — LURASIDONE HCL 80 MG PO TABS
80.0000 mg | ORAL_TABLET | Freq: Every day | ORAL | 2 refills | Status: DC
Start: 2020-04-22 — End: 2020-07-28

## 2020-04-22 MED ORDER — ALPRAZOLAM 0.5 MG PO TABS: 0.5000 mg | ORAL_TABLET | Freq: Every day | ORAL | 0 refills | Status: DC | PRN

## 2020-04-22 MED ORDER — TRAZODONE HCL 150 MG PO TABS: 150.0000 mg | ORAL_TABLET | Freq: Every day | ORAL | 2 refills | Status: DC

## 2020-04-22 MED ORDER — ATOMOXETINE HCL 18 MG PO CAPS: 18.0000 mg | ORAL_CAPSULE | Freq: Every day | ORAL | 0 refills | Status: DC

## 2020-04-22 NOTE — Progress Notes (Signed)
Virtual Visit via Telephone Note  I connected with [place] on 04/22/20 at 10:00 AM EDT by telephone and verified that I am speaking with the correct person using two identifiers.  Location: Patient: Home Provider: Home Office   I discussed the limitations, risks, security and privacy concerns of performing an evaluation and management service by telephone and the availability of in person appointments. I also discussed with the patient that there may be a patient responsible charge related to this service. The patient expressed understanding and agreed to proceed.   History of Present Illness: Patient is evaluated by phone session.  She is doing much better and denies any recent paranoia, hallucination, panic attacks or anxiety attacks.  She does complain of hot flashes and sometimes her clothes are wet because of sweating which she believes due to menopause.  She has not contacted her OB/GYN but plan to get appointment soon.  Overall her stress level is reduced from the past.  She reported that her husband is more civil now and does not call or contact as frequent.  She had a good support from her friends.  She enjoys the company of the grandkids.  Lately she has noticed struggle with her ADD symptoms.  Patient used to take Ritalin but that may have caused increased anxiety in the past and now she is wondering if she can try a nonstimulant medicine to help her ADD.  She struggles with attention, concentration and sometimes she has the right things multiple times to remember.  She also endorsed losing track of the discussion.  In the past she recalled taking the Strattera which was nonstimulant.  She denies any mania, psychosis or any suicidal thoughts.  Her energy level is good.  Recently she had a procedure to stretch her esophagus which went well.  She is cautious about her weight and her appetite is stable.    Past Psychiatric History: [facility].TriedStrattera, Prozac, Trilafon, Abilify, Lamictal.No h/osuicidal attempt. H/Otrouble with the law later probation.H/O paranoia, hallucination, anxiety.   Psychiatric Specialty Exam: Physical Exam  Review of Systems  Weight 125 lb (56.7 kg).There is no height or weight on file to calculate BMI.  General Appearance: NA  Eye Contact:  NA  Speech:  Normal Rate  Volume:  Normal  Mood:  Euthymic  Affect:  NA  Thought Process:  Goal Directed  Orientation:  Full (Time, Place, and Person)  Thought Content:  Logical  Suicidal Thoughts:  No  Homicidal Thoughts:  No  Memory:  Immediate;   Good Recent;   Fair Remote;   Fair  Judgement:  Intact  Insight:  Present  Psychomotor Activity:  NA  Concentration:  Concentration: Fair and Attention Span: Fair  Recall:  [employer] of Knowledge:  Good  Language:  Good  Akathisia:  No  Handed:  Right  AIMS (if indicated):     Assets:  Communication Skills Desire for Improvement Housing Resilience Social Support Transportation  ADL's:  Intact  Cognition:  WNL  Sleep:   better      Assessment and Plan: Schizoaffective disorder, bipolar type.  Panic attacks.  ADD, inattentive type.  We discussed about her symptoms of ADD which is lately worsening.  In the past she had tried Strattera which is a nonstimulant and she would like to reconsider it.  I discussed her medication and recommend since her paranoia, voices and mood is stable we can try discontinuing Haldol but remain on  Latuda and lithium.  It may help her attention concentration however we will also provide a low-dose Strattera in case stopping the Haldol does not improve her attention or concentration.  I also recommend if she felt that her paranoia and hallucinations are coming back then she needs to call us back immediately.  Overall her living situation is much better and she is more relaxed.  Also encouraged to contact her OB/GYN to address her  hot flashes.  We will do a lithium level on her next appointment.  For now continue lithium 600 mg 3 times a day, Latuda 80 mg daily, trazodone 150 mg at bedtime.  She is taking Xanax as needed for anxiety.  We will provide a new prescription of Xanax to take as needed #20 for panic attacks.  Start Strattera 18 mg daily to help with attention and concentration.  She is no longer taking Cogentin.  Recommended to call us back if she has any question, concern if she feels worsening of the symptoms.  Follow up in 3 months.  Patient will call if she feels Strattera helping to get more refills.  Follow Up Instructions:    I discussed the assessment and treatment plan with the patient. The patient was provided an opportunity to ask questions and all were answered. The patient agreed with the plan and demonstrated an understanding of the instructions.   The patient was advised to call back or seek an in-person evaluation if the symptoms worsen or if the condition fails to improve as anticipated.  I provided 25 minutes of non-face-to-face time during this encounter.   Kathlee Nations, MD

## 2020-04-28 ENCOUNTER — Other Ambulatory Visit (HOSPITAL_COMMUNITY): Payer: Self-pay | Admitting: Psychiatry

## 2020-04-28 DIAGNOSIS — F250 Schizoaffective disorder, bipolar type: Secondary | ICD-10-CM

## 2020-05-08 ENCOUNTER — Ambulatory Visit: Payer: 59

## 2020-05-13 ENCOUNTER — Ambulatory Visit: Payer: 59 | Admitting: Nurse Practitioner

## 2020-05-14 ENCOUNTER — Other Ambulatory Visit (HOSPITAL_COMMUNITY): Payer: Self-pay | Admitting: Psychiatry

## 2020-05-14 DIAGNOSIS — F900 Attention-deficit hyperactivity disorder, predominantly inattentive type: Secondary | ICD-10-CM

## 2020-05-26 ENCOUNTER — Telehealth (HOSPITAL_COMMUNITY): Payer: Self-pay | Admitting: *Deleted

## 2020-05-26 DIAGNOSIS — F410 Panic disorder [episodic paroxysmal anxiety] without agoraphobia: Secondary | ICD-10-CM

## 2020-05-26 MED ORDER — ALPRAZOLAM 0.5 MG PO TABS: 0.5000 mg | ORAL_TABLET | Freq: Every day | ORAL | 0 refills | Status: DC | PRN

## 2020-05-26 NOTE — Telephone Encounter (Signed)
Erroneous encounter

## 2020-06-03 ENCOUNTER — Ambulatory Visit: Payer: 59

## 2020-06-11 ENCOUNTER — Ambulatory Visit: Payer: 59 | Admitting: Nurse Practitioner

## 2020-06-18 ENCOUNTER — Ambulatory Visit: Payer: 59 | Admitting: Gastroenterology

## 2020-06-23 ENCOUNTER — Other Ambulatory Visit (HOSPITAL_COMMUNITY): Payer: Self-pay | Admitting: Psychiatry

## 2020-07-17 ENCOUNTER — Other Ambulatory Visit (HOSPITAL_COMMUNITY): Payer: Self-pay | Admitting: *Deleted

## 2020-07-17 DIAGNOSIS — F410 Panic disorder [episodic paroxysmal anxiety] without agoraphobia: Secondary | ICD-10-CM

## 2020-07-17 MED ORDER — ALPRAZOLAM 0.5 MG PO TABS: 0.5000 mg | ORAL_TABLET | Freq: Every day | ORAL | 0 refills | Status: DC | PRN

## 2020-07-28 ENCOUNTER — Telehealth (INDEPENDENT_AMBULATORY_CARE_PROVIDER_SITE_OTHER): Payer: 59 | Admitting: Psychiatry

## 2020-07-28 ENCOUNTER — Encounter (HOSPITAL_COMMUNITY): Payer: Self-pay | Admitting: Psychiatry

## 2020-07-28 ENCOUNTER — Other Ambulatory Visit: Payer: Self-pay

## 2020-07-28 DIAGNOSIS — F410 Panic disorder [episodic paroxysmal anxiety] without agoraphobia: Secondary | ICD-10-CM | POA: Diagnosis not present

## 2020-07-28 DIAGNOSIS — F900 Attention-deficit hyperactivity disorder, predominantly inattentive type: Secondary | ICD-10-CM

## 2020-07-28 DIAGNOSIS — F250 Schizoaffective disorder, bipolar type: Secondary | ICD-10-CM

## 2020-07-28 MED ORDER — LITHIUM CARBONATE 300 MG PO TABS
ORAL_TABLET | ORAL | 2 refills | Status: DC
Start: 2020-07-28 — End: 2020-10-28

## 2020-07-28 MED ORDER — ALPRAZOLAM 0.5 MG PO TABS: 0.5000 mg | ORAL_TABLET | Freq: Every day | ORAL | 0 refills | Status: DC | PRN

## 2020-07-28 MED ORDER — LURASIDONE HCL 80 MG PO TABS
80.0000 mg | ORAL_TABLET | Freq: Every day | ORAL | 2 refills | Status: DC
Start: 2020-07-28 — End: 2020-10-28

## 2020-07-28 MED ORDER — ATOMOXETINE HCL 18 MG PO CAPS: 18.0000 mg | ORAL_CAPSULE | Freq: Every day | ORAL | 0 refills | Status: DC

## 2020-07-28 NOTE — Progress Notes (Addendum)
Virtual Visit via Telephone Note  I connected with [name] on 07/28/20 at 10:40 AM EDT by telephone and verified that I am speaking with the correct person using two identifiers.  Location: Patient: [place] for vacation Provider: Home Office   I discussed the limitations, risks, security and privacy concerns of performing an evaluation and management service by telephone and the availability of in person appointments. I also discussed with the patient that there may be a patient responsible charge related to this service. The patient expressed understanding and agreed to proceed.   History of Present Illness: Patient is evaluated by phone session.  Currently she is in [place] enjoying the trip with her friends.  She requires Xanax because she was very anxious and could not sleep after having issues from her husband who is now separated.  Patient not sure to take the divorce but need to make a decision around November after 1 year of separation.  She is no longer taking Haldol and she feels that she can take the Xanax at nighttime and does not need the trazodone.  Her hallucination, paranoia is stable.  She still enjoyed the company of her grandkids.  She denies any anger, crying spells or any feeling of hopelessness.  She liked the Strattera which is helping her attention, focus and multitasking.  Her appetite is okay.  She apologized not having blood work but promised to have the blood work very soon when she comes back to home after few days of medication.  She has no tremors, shakes or any EPS.    Psychiatric Specialty Exam: Physical Exam  Review of Systems  There were no vitals taken for this visit.There is no height or weight on file to calculate BMI.  General Appearance: NA  Eye Contact:  NA  Speech:  Clear and Coherent  Volume:  Normal  Mood:  Euthymic  Affect:  NA  Thought Process:  Goal Directed  Orientation:  Full (Time, Place, and Person)  Thought Content:  WDL  Suicidal  Thoughts:  No  Homicidal Thoughts:  No  Memory:  Immediate;   Good Recent;   Good Remote;   Good  Judgement:  Intact  Insight:  Present  Psychomotor Activity:  NA  Concentration:  Concentration: Good and Attention Span: Fair  Recall:  Good  Fund of Knowledge:  Good  Language:  Good  Akathisia:  No  Handed:  Right  AIMS (if indicated):     Assets:  Communication Skills Desire for Improvement Housing Resilience Social Support Transportation  ADL's:  Intact  Cognition:  WNL  Sleep:   ok      Assessment and Plan: Schizoaffective disorder, bipolar type.  Panic attacks.  ADD, inattentive type.  Patient is stable on medication.  She is no longer taking Haldol and she like to come off from trazodone because she feels the Xanax helping her sleep better than trazodone.  She does not take Xanax every night but like to have a refill.  She also feels Strattera helping her attention and focus.  We will continue Latuda 80 mg daily, lithium 600 mg 3 times a day, Strattera 80 mg daily and Xanax 0.5 mg to take as needed.  Reminded blood work.  Recommended to call us back if she has any question or any concern.  Follow-up in 3 months.  Follow Up Instructions:    I discussed the assessment and treatment plan with the patient. The patient was provided an opportunity to ask questions and  all were answered. The patient agreed with the plan and demonstrated an understanding of the instructions.   The patient was advised to call back or seek an in-person evaluation if the symptoms worsen or if the condition fails to improve as anticipated.  I provided 17 minutes of non-face-to-face time during this encounter.   Kathlee Nations, MD

## 2020-07-29 ENCOUNTER — Ambulatory Visit: Payer: 59

## 2020-08-10 ENCOUNTER — Other Ambulatory Visit (HOSPITAL_COMMUNITY): Payer: Self-pay | Admitting: Psychiatry

## 2020-08-10 DIAGNOSIS — F250 Schizoaffective disorder, bipolar type: Secondary | ICD-10-CM

## 2020-08-13 ENCOUNTER — Other Ambulatory Visit (HOSPITAL_COMMUNITY): Payer: Self-pay | Admitting: Psychiatry

## 2020-09-15 ENCOUNTER — Ambulatory Visit
Admission: RE | Admit: 2020-09-15 | Discharge: 2020-09-15 | Disposition: A | Discharge status: Home / Self Care | Payer: 59 | Source: Ambulatory Visit | Attending: Family Medicine | Admitting: Family Medicine

## 2020-09-15 ENCOUNTER — Other Ambulatory Visit: Payer: Self-pay

## 2020-09-15 DIAGNOSIS — Z1231 Encounter for screening mammogram for malignant neoplasm of breast: Secondary | ICD-10-CM

## 2020-10-12 ENCOUNTER — Other Ambulatory Visit (HOSPITAL_COMMUNITY): Payer: Self-pay | Admitting: Psychiatry

## 2020-10-12 DIAGNOSIS — F410 Panic disorder [episodic paroxysmal anxiety] without agoraphobia: Secondary | ICD-10-CM

## 2020-10-22 ENCOUNTER — Ambulatory Visit: Payer: 59 | Admitting: Gastroenterology

## 2020-10-27 ENCOUNTER — Other Ambulatory Visit (HOSPITAL_COMMUNITY): Payer: Self-pay | Admitting: Psychiatry

## 2020-10-27 DIAGNOSIS — F900 Attention-deficit hyperactivity disorder, predominantly inattentive type: Secondary | ICD-10-CM

## 2020-10-28 ENCOUNTER — Telehealth (HOSPITAL_BASED_OUTPATIENT_CLINIC_OR_DEPARTMENT_OTHER): Payer: 59 | Admitting: Psychiatry

## 2020-10-28 ENCOUNTER — Other Ambulatory Visit: Payer: Self-pay

## 2020-10-28 ENCOUNTER — Other Ambulatory Visit (HOSPITAL_COMMUNITY): Payer: Self-pay | Admitting: *Deleted

## 2020-10-28 ENCOUNTER — Encounter (HOSPITAL_COMMUNITY): Payer: Self-pay | Admitting: Psychiatry

## 2020-10-28 VITALS — Wt 125.0 lb

## 2020-10-28 DIAGNOSIS — F250 Schizoaffective disorder, bipolar type: Secondary | ICD-10-CM

## 2020-10-28 DIAGNOSIS — Z79899 Other long term (current) drug therapy: Secondary | ICD-10-CM

## 2020-10-28 DIAGNOSIS — F900 Attention-deficit hyperactivity disorder, predominantly inattentive type: Secondary | ICD-10-CM

## 2020-10-28 DIAGNOSIS — F410 Panic disorder [episodic paroxysmal anxiety] without agoraphobia: Secondary | ICD-10-CM

## 2020-10-28 MED ORDER — ALPRAZOLAM 0.25 MG PO TABS: 0.2500 mg | ORAL_TABLET | Freq: Every day | ORAL | 1 refills | Status: DC | PRN

## 2020-10-28 MED ORDER — LURASIDONE HCL 80 MG PO TABS: 80.0000 mg | ORAL_TABLET | Freq: Every day | ORAL | 2 refills | Status: DC

## 2020-10-28 MED ORDER — LITHIUM CARBONATE 300 MG PO TABS: ORAL_TABLET | ORAL | 2 refills | Status: DC

## 2020-10-28 MED ORDER — ATOMOXETINE HCL 25 MG PO CAPS: 25.0000 mg | ORAL_CAPSULE | Freq: Every day | ORAL | 2 refills | Status: DC

## 2020-10-28 NOTE — Progress Notes (Addendum)
Patient ID: [place], female   DOB: June 08, 1970, 50 y.o.   MRN: [id number]  Virtual Visit via Telephone Note  I connected with [place] on 10/28/20 at 10:20 AM EDT by telephone and verified that I am speaking with the correct person using two identifiers.  Location: Patient: In Car Provider: Home Office   I discussed the limitations, risks, security and privacy concerns of performing an evaluation and management service by telephone and the availability of in person appointments. I also discussed with the patient that there may be a patient responsible charge related to this service. The patient expressed understanding and agreed to proceed.   History of Present Illness: Patient is evaluated by phone session.  She is in the car and taking her mother to the doctor's appointment.  Patient reported things are almost the same but lately she noted struggle with attention focus and multitasking.  She had sometimes anxious when things are not done on time.  She has few panic attacks which she feels helped by taking Xanax.  She is prescribed the low-dose Xanax 0.25 mg and she feels that helped better than higher dose of the Xanax.  She is out of the Xanax and would like to have a refill.  She is sleeping 4 to 5 hours and sometimes restless.  She is not taking trazodone but recently her PCP prescribed gabapentin 100 mg to take 3 times a day for her wrist pain but after taking for 2 doses she stopped when someone told about side effects of the gabapentin.  However patient do not recall any side effects from the gabapentin.  She has a lot of wrist pain and sometimes difficulty driving and lifting.  She has done physical therapy but has not seen huge improvement.  She is taking meloxicam.  She feels her mood swings are much better with Latuda and lithium.  She denies any mania, hallucination or any paranoia.  She is no longer taking Haldol for a while and feels Latuda helping the paranoia and voices.  She  again apologized not having the blood work but promised to have it done soon.  She admitted sometime forgetful.  She is still legally separated from her husband and has not decided yet to serve the divorce paper but this November will be 1 year of separation.  Patient has no tremors or shakes or any EPS.  She enjoyed the company of her grandkids.  She denies drinking or using any illegal substances.  Her appetite is okay and her weight is stable.   Past Psychiatric History:  H/O alcohol, cocaine and inpatient at [facility] in 2007 and rehabilitation at [facility]. Tried Strattera, Prozac, Trilafon, Abilify, Lamictal. No h/o suicidal attempt.  H/O trouble with the law later probation. H/O paranoia, hallucination, anxiety.  Psychiatric Specialty Exam: Physical Exam  Review of Systems  Musculoskeletal:        Wrist pain   Weight 125 lb (56.7 kg).There is no height or weight on file to calculate BMI.  General Appearance: NA  Eye Contact:  NA  Speech:  Normal Rate  Volume:  Normal  Mood:  Anxious  Affect:  NA  Thought Process:  Descriptions of Associations: Intact  Orientation:  Full (Time, Place, and Person)  Thought Content:  WDL  Suicidal Thoughts:  No  Homicidal Thoughts:  No  Memory:  Immediate;   Good Recent;   Good Remote;   Fair  Judgement:  Intact  Insight:  Present  Psychomotor Activity:  NA  Concentration:  Concentration: Fair and Attention Span: Fair  Recall:  [employer] of Knowledge:  Fair  Language:  Good  Akathisia:  No  Handed:  Right  AIMS (if indicated):     Assets:  Communication Skills Desire for Improvement Housing Social Support Transportation  ADL's:  Intact  Cognition:  WNL  Sleep:   fair      Assessment and Plan: Schizoaffective disorder, bipolar type.  Panic attacks.  ADD, inattentive type.  Patient is still struggling with attention and focus and we discussed try a higher dose of Strattera and she agreed.  Currently she is taking 18 mg and we will  increase to 25 mg a day.  She also need a refill of Xanax and last time we provided 0.25 mg which helps the panic attack.  she liked the lower dose.  Will refill Xanax 0.25 mg #20 for panic attacks and we will add 1 more refill.  Continue lithium 600 mg 3 times a day, Latuda 80 mg daily.  Patient has no rash, itching tremors or shakes.  We will order CBC, CMP, hemoglobin A1c and lithium level.  I also encouraged she should try gabapentin 100-200 mg prescribed by PCP to help the wrist pain but has additional benefit for anxiety and insomnia.  She agreed to give a trial.  She like to have the gabapentin filled from our office if she is taking for anxiety and sleep.  I agree with the plan.  Recommended to call us back if she has any question or any concern.  Patient like to have a next appointment in person.  Follow-up in 3 months.  Follow Up Instructions:    I discussed the assessment and treatment plan with the patient. The patient was provided an opportunity to ask questions and all were answered. The patient agreed with the plan and demonstrated an understanding of the instructions.   The patient was advised to call back or seek an in-person evaluation if the symptoms worsen or if the condition fails to improve as anticipated.  I provided 27 minutes of non-face-to-face time during this encounter.   Kathlee Nations, MD

## 2020-11-05 LAB — CMP14+EGFR
ALT: 14 IU/L (ref 0–32)
AST: 17 IU/L (ref 0–40)
Albumin/Globulin Ratio: 2 (ref 1.2–2.2)
Albumin: 5.1 g/dL — ABNORMAL HIGH (ref 3.8–4.9)
Alkaline Phosphatase: 89 IU/L (ref 44–121)
BUN/Creatinine Ratio: 18 (ref 9–23)
BUN: 10 mg/dL (ref 6–24)
Bilirubin Total: 0.4 mg/dL (ref 0.0–1.2)
CO2: 26 mmol/L (ref 20–29)
Calcium: 10 mg/dL (ref 8.7–10.2)
Chloride: 100 mmol/L (ref 96–106)
Creatinine, Ser: 0.56 mg/dL — ABNORMAL LOW (ref 0.57–1.00)
Globulin, Total: 2.5 g/dL (ref 1.5–4.5)
Glucose: 83 mg/dL (ref 70–99)
Potassium: 4.3 mmol/L (ref 3.5–5.2)
Sodium: 143 mmol/L (ref 134–144)
Total Protein: 7.6 g/dL (ref 6.0–8.5)
eGFR: 110 mL/min/{1.73_m2} (ref >59)

## 2020-11-05 LAB — CBC WITH DIFFERENTIAL/PLATELET
Basophils Absolute: 0 10*3/uL (ref 0.0–0.2)
Basos: 1 %
EOS (ABSOLUTE): 0.3 10*3/uL (ref 0.0–0.4)
Eos: 5 %
Hematocrit: 40.6 % (ref 34.0–46.6)
Hemoglobin: 14.2 g/dL (ref 11.1–15.9)
Immature Grans (Abs): 0 10*3/uL (ref 0.0–0.1)
Immature Granulocytes: 0 %
Lymphocytes Absolute: 2.2 10*3/uL (ref 0.7–3.1)
Lymphs: 33 %
MCH: 31.2 pg (ref 26.6–33.0)
MCHC: 35 g/dL (ref 31.5–35.7)
MCV: 89 fL (ref 79–97)
Monocytes Absolute: 0.6 10*3/uL (ref 0.1–0.9)
Monocytes: 9 %
Neutrophils Absolute: 3.6 10*3/uL (ref 1.4–7.0)
Neutrophils: 52 %
Platelets: 234 10*3/uL (ref 150–450)
RBC: 4.55 x10E6/uL (ref 3.77–5.28)
RDW: 12.7 % (ref 11.7–15.4)
WBC: 6.9 10*3/uL (ref 3.4–10.8)

## 2020-11-05 LAB — LITHIUM LEVEL: Lithium Lvl: <0.1 mmol/L — ABNORMAL LOW (ref 0.5–1.2)

## 2020-11-20 ENCOUNTER — Other Ambulatory Visit (HOSPITAL_COMMUNITY): Payer: Self-pay | Admitting: Psychiatry

## 2020-11-20 DIAGNOSIS — F900 Attention-deficit hyperactivity disorder, predominantly inattentive type: Secondary | ICD-10-CM

## 2021-01-06 ENCOUNTER — Other Ambulatory Visit (HOSPITAL_COMMUNITY): Payer: Self-pay | Admitting: Psychiatry

## 2021-01-06 DIAGNOSIS — F410 Panic disorder [episodic paroxysmal anxiety] without agoraphobia: Secondary | ICD-10-CM

## 2021-01-29 ENCOUNTER — Telehealth (HOSPITAL_COMMUNITY): Payer: Self-pay | Admitting: Psychiatry

## 2021-01-29 ENCOUNTER — Encounter (HOSPITAL_COMMUNITY): Payer: Self-pay | Admitting: Psychiatry

## 2021-01-29 ENCOUNTER — Other Ambulatory Visit: Payer: Self-pay

## 2021-01-29 ENCOUNTER — Ambulatory Visit (HOSPITAL_COMMUNITY): Payer: Self-pay | Admitting: Psychiatry

## 2021-01-29 ENCOUNTER — Ambulatory Visit (HOSPITAL_BASED_OUTPATIENT_CLINIC_OR_DEPARTMENT_OTHER): Payer: 59 | Admitting: Psychiatry

## 2021-01-29 ENCOUNTER — Other Ambulatory Visit (HOSPITAL_COMMUNITY): Payer: Self-pay | Admitting: Psychiatry

## 2021-01-29 VITALS — BP 115/71 | HR 66 | Temp 97.9°F | Ht 63.0 in | Wt 127.8 lb

## 2021-01-29 DIAGNOSIS — F250 Schizoaffective disorder, bipolar type: Secondary | ICD-10-CM | POA: Diagnosis not present

## 2021-01-29 DIAGNOSIS — F410 Panic disorder [episodic paroxysmal anxiety] without agoraphobia: Secondary | ICD-10-CM | POA: Diagnosis not present

## 2021-01-29 DIAGNOSIS — F900 Attention-deficit hyperactivity disorder, predominantly inattentive type: Secondary | ICD-10-CM

## 2021-01-29 MED ORDER — LURASIDONE HCL 80 MG PO TABS: 80.0000 mg | ORAL_TABLET | Freq: Every day | ORAL | 2 refills | Status: DC

## 2021-01-29 MED ORDER — ATOMOXETINE HCL 25 MG PO CAPS: ORAL_CAPSULE | ORAL | 2 refills | Status: DC

## 2021-01-29 MED ORDER — ALPRAZOLAM 0.25 MG PO TABS: 0.2500 mg | ORAL_TABLET | Freq: Every day | ORAL | 0 refills | Status: DC | PRN

## 2021-01-29 MED ORDER — DOXEPIN HCL 10 MG PO CAPS
10.0000 mg | ORAL_CAPSULE | Freq: Every day | ORAL | 1 refills | Status: DC
Start: 2021-01-29 — End: 2021-03-12

## 2021-01-29 NOTE — Progress Notes (Signed)
Patient ID: [place], female   DOB: [birth year], 51 y.o.   MRN: [id number]   Location: Patient: In person Office Provider: Office   History of Present Illness: Patient is evaluated in person in the office.  She came for her appointment.  Patient endorsed increased anxiety and nervousness in past few weeks.  Her older son was admitted to behavioral health for bipolar disorder and now he is taking the medication and feeling better.  Patient reported his wife is leaving him.  Patient also reported mother not doing well and getting treatment.  His ex-husband still causing issues and in the beginning she was hoping things will get better but now he relapsed into drinking and leaving empty bottles on her driveway and she has to call the police.  Please recommended to take 50 B but patient declined and hoping that he can stop drinking and harassing her on his own.  She endorses racing thoughts, poor sleep and there are times when she gets very emotional tearful.  She feels sometimes exhausted because she is not sleeping and having a lot of racing thoughts.  Her energy level is low.  She admitted a lot of family issues and this time Christmas was not good even though she has lites and tree but she did not feel happy inside.  Only thing that she enjoys having grandkids company.  She admitted taking few times Xanax to calm her down.  She is only taking 0.25 as higher dose make her very sleepy and groggy.  She still struggle with attention, focus but increase Strattera to help her focus.  She is no longer taking any pain medication including gabapentin and tramadol and diclofenac sodium.  She denies any mania or psychosis.  She endorsed occasionally paranoia but denies any hallucination or any suicidal thoughts.  Her appetite is okay.  Her weight is stable.  Admitted taking lithium regularly but her lithium level is very low.  In the past we had stopped lithium due to persistent low level but patient decompensated  and she like to keep the lithium dose even though level is low.  Past Psychiatric History:  H/O alcohol, cocaine and inpatient at [facility] in 2007 and rehabilitation at [facility]. Tried Strattera, Prozac, Trilafon, Abilify, Lamictal. No h/o suicidal attempt.  H/O trouble with the law later probation. H/O paranoia, hallucination, anxiety.  Recent Results (from the past 2160 hour(s))  CBC with Differential     Status: None   Collection Time: 11/04/20  3:09 PM  Result Value Ref Range   WBC 6.9 3.4 - 10.8 x10E3/uL   RBC 4.55 3.77 - 5.28 x10E6/uL   Hemoglobin 14.2 11.1 - 15.9 g/dL   Hematocrit 40.6 34.0 - 46.6 %   MCV 89 79 - 97 fL   MCH 31.2 26.6 - 33.0 pg   MCHC 35.0 31.5 - 35.7 g/dL   RDW 12.7 11.7 - 15.4 %   Platelets 234 150 - 450 x10E3/uL   Neutrophils 52 Not Estab. %   Lymphs 33 Not Estab. %   Monocytes 9 Not Estab. %   Eos 5 Not Estab. %   Basos 1 Not Estab. %   Neutrophils Absolute 3.6 1.4 - 7.0 x10E3/uL   Lymphocytes Absolute 2.2 0.7 - 3.1 x10E3/uL   Monocytes Absolute 0.6 0.1 - 0.9 x10E3/uL   EOS (ABSOLUTE) 0.3 0.0 - 0.4 x10E3/uL   Basophils Absolute 0.0 0.0 - 0.2 x10E3/uL   Immature Granulocytes 0 Not Estab. %   Immature Grans (Abs)  0.0 0.0 - 0.1 x10E3/uL  CMP14+EGFR     Status: Abnormal   Collection Time: 11/04/20  3:09 PM  Result Value Ref Range   Glucose 83 70 - 99 mg/dL   BUN 10 6 - 24 mg/dL   Creatinine, Ser 0.56 (L) 0.57 - 1.00 mg/dL   eGFR 110 >59 mL/min/1.73   BUN/Creatinine Ratio 18 9 - 23   Sodium 143 134 - 144 mmol/L   Potassium 4.3 3.5 - 5.2 mmol/L   Chloride 100 96 - 106 mmol/L   CO2 26 20 - 29 mmol/L   Calcium 10.0 8.7 - 10.2 mg/dL   Total Protein 7.6 6.0 - 8.5 g/dL   Albumin 5.1 (H) 3.8 - 4.9 g/dL   Globulin, Total 2.5 1.5 - 4.5 g/dL   Albumin/Globulin Ratio 2.0 1.2 - 2.2   Bilirubin Total 0.4 0.0 - 1.2 mg/dL   Alkaline Phosphatase 89 44 - 121 IU/L   AST 17 0 - 40 IU/L   ALT 14 0 - 32 IU/L  Lithium level     Status: Abnormal   Collection  Time: 11/04/20  3:09 PM  Result Value Ref Range   Lithium Lvl <0.1 (L) 0.5 - 1.2 mmol/L    Comment: A concentration of 0.5-0.8 mmol/L is advised for long-term use; concentrations of up to 1.2 mmol/L may be necessary during acute treatment. **Verified by repeat analysis**                                  Detection Limit = 0.1                           <0.1 indicates None Detected      Psychiatric Specialty Exam: Physical Exam  Review of Systems  Blood pressure 115/71, pulse 66, temperature 97.9 F (36.6 C), temperature source Oral, height _0  (1.6 m), weight 127 lb 12.8 oz (58 kg), SpO2 97 %.Body mass index is 22.64 kg/m.  General Appearance: Casual  Eye Contact:  Good  Speech:   fast  Volume:  Normal  Mood:  Anxious and Dysphoric  Affect:  Depressed  Thought Process:  Goal Directed  Orientation:  Full (Time, Place, and Person)  Thought Content:  Rumination  Suicidal Thoughts:  No  Homicidal Thoughts:  No  Memory:  Immediate;   Good Recent;   Good Remote;   Fair  Judgement:  Intact  Insight:  Present  Psychomotor Activity:  Increased  Concentration:  Concentration: Fair and Attention Span: Fair  Recall:  [employer] of Knowledge:  Good  Language:  Good  Akathisia:  No  Handed:  Right  AIMS (if indicated):     Assets:  Communication Skills Desire for Improvement Housing Social Support Transportation  ADL's:  Intact  Cognition:  WNL  Sleep:   2-3 hrs     Assessment and Plan: Schizoaffective disorder, bipolar type.  Panic attacks.  ADD, inattentive type.  I reviewed blood work results and current medication.  She noticed improvement with increase Strattera but is still have a lot of family issues.  She like to try something to help her sleep.  In the past she had tried melatonin, Vistaril with poor outcome.  Higher dose of Xanax make her very sleepy and she like to keep the Xanax when she has panic attack.  She has taken few times recently.  I recommend she can  try low-dose doxepin 10 mg to help her sleep which is also antidepressant to help her anxiety.  Patient agreed to give a try.  We will continue lithium 600 mg 3 times a day, Latuda 80 mg daily and we will provide Xanax 0.25 mg #20 to help her panic attacks.  She is not interested in therapy.  She has a good support from her friends.  I recommend to call us back if she is any question or any concern.  Follow-up in 4 to 6 weeks.  Follow Up Instructions:    I discussed the assessment and treatment plan with the patient. The patient was provided an opportunity to ask questions and all were answered. The patient agreed with the plan and demonstrated an understanding of the instructions.    Kathlee Nations, MD

## 2021-02-21 ENCOUNTER — Other Ambulatory Visit (HOSPITAL_COMMUNITY): Payer: Self-pay | Admitting: Psychiatry

## 2021-02-21 DIAGNOSIS — F250 Schizoaffective disorder, bipolar type: Secondary | ICD-10-CM

## 2021-02-21 DIAGNOSIS — F900 Attention-deficit hyperactivity disorder, predominantly inattentive type: Secondary | ICD-10-CM

## 2021-02-21 DIAGNOSIS — F410 Panic disorder [episodic paroxysmal anxiety] without agoraphobia: Secondary | ICD-10-CM

## 2021-03-11 NOTE — Progress Notes (Signed)
Referring Provider:  Primary Care Physician:  Aletha Halim., PA-C  Primary GI: Dr. [name]  Patient Location: Home   Provider Location: [facility] office   Reason for Visit: Follow-up GERD and dysphagia.    Persons present on the virtual encounter, with roles: Aliene Altes, PA-C (Provider), Ruth Duncan (patient)  Total time (minutes) spent on medical discussion: 13 minutes  Virtual Visit via video note Due to COVID-19, visit is conducted virtually and was requested by patient.   I connected with Ruth Duncan on 03/12/21 at  8:30 AM EST by video and verified that I am speaking with the correct person using two identifiers.   I discussed the limitations, risks, security and privacy concerns of performing an evaluation and management service by video and the availability of in person appointments. I also discussed with the patient that there may be a patient responsible charge related to this service. The patient expressed understanding and agreed to proceed.  Chief Complaint  Patient presents with   Follow-up     History of Present Illness: 51 year old female presenting today for follow-up of GERD and dysphagia.    She was last seen in our office for the same in January 2022.  She reported worsening GERD and dysphagia.  Also with globus sensation.  GERD symptoms daily, typically in the evening.  Taking Protonix 40 mg twice daily.  Had taken quite a bit of NSAIDs in October when she had COVID.  Suspected this may be contributing to her worsening upper GI symptoms.  Nausea/vomiting with GERD symptoms.  Solid food dysphagia daily.  Recommended stopping Protonix and starting Dexilant 60 mg daily, EGD for further evaluation.  EGD 03/31/2020: Small hiatal hernia, Schatzki's ring dilated, gastritis biopsied, normal examined duodenum.  Biopsies negative for H. pylori, metaplasia, dysplasia.  Today:   GERD:  Well controlled on Dexilant daily. Only with breakthrough if eating late or spicy.  No nausea or vomiting.  Dysphagia: Dysphagia returning. Dilation helped for 6-7 months. Symptoms are daily. Can occur with anything. Solid foods will get stuck.  Occasionally feels liquids will go down the wrong way or that she has to really make an effort to swallow.  Left lower abdominal soreness/tightness. Comes and goes. This is chronic for years. No aggravating or relieving factors. Not after eating. Bowels moving very well. No constipation or diarrhea. No blood in the stools or black stools. Not very bothered by this.   Past Medical History:  Diagnosis Date   Bipolar disorder ([place])    Chronic abdominal pain    hx of IBS-D, ? bile salt-induced diarrhea   Chronic eczema of hand 2012   BILATERAL ON MTX SINCE 2130   Complication of anesthesia    Diverticulitis    GERD (gastroesophageal reflux disease)    History of kidney stones    Ovarian cancer ([place]) 2010   PONV (postoperative nausea and vomiting)    Surgical menopause 2010   Tubular adenoma 2010     Past Surgical History:  Procedure Laterality Date   ABDOMINAL HYSTERECTOMY  2010   APPENDECTOMY  2004   BACTERIAL OVERGROWTH TEST N/A 09/01/2015   Procedure: BACTERIAL OVERGROWTH TEST;  Surgeon: Danie Binder, MD;  [facility];  Service: Endoscopy;  Laterality: N/A;  [place] N/A 03/31/2020   Procedure: BALLOON DILATION;  Surgeon: Eloise Harman, DO;  Location: [facility];  Service: Endoscopy;  Laterality: N/A;   [place]   COLONOSCOPY  12/2008  tubular adenomas, negative microscopic colitis. Due for Flex Sig Dec 2015   ESOPHAGOGASTRODUODENOSCOPY  05/06/2008   KVQ:QVZDGL esophagus without evidence of Barrett's, mass, erosion, ulceration or stricture/ Hiatal hernia/ Normal duodenal bulb and second portion of the duodenum   ESOPHAGOGASTRODUODENOSCOPY N/A 10/04/2014   Dr. Oneida Alar: patent stricture at GE junction, no dilation, small hiatal hernia, mild non-erosive gastritis (benign),  multiple small gastric polyps, benign. Capsule study recommended but never completed    ESOPHAGOGASTRODUODENOSCOPY (EGD) WITH PROPOFOL N/A 04/10/2019   Procedure: ESOPHAGOGASTRODUODENOSCOPY (EGD) WITH PROPOFOL;  Surgeon: Danie Binder, MD;  [facility];  Service: Endoscopy;  Laterality: N/A;  1:15pm-moved to 4/6 @ 2:45pm   ESOPHAGOGASTRODUODENOSCOPY (EGD) WITH PROPOFOL N/A 03/31/2020   Procedure: ESOPHAGOGASTRODUODENOSCOPY (EGD) WITH PROPOFOL;  Surgeon: Eloise Harman, DO;  [facility];  Service: Endoscopy;  Laterality: N/A;  am   FLEXIBLE SIGMOIDOSCOPY N/A 02/18/2014   SLF: Normal small bowel. formed stool at anastomosis. Stool cleared with irrigation. One superfical erosion at the anastomosis. otherwise normal. normal rectal mucosa   SAVORY DILATION N/A 04/10/2019   Procedure: SAVORY DILATION;  Surgeon: Danie Binder, MD;  [facility];  Service: Endoscopy;  Laterality: N/A;   SUBTOTAL COLECTOMY  2011   TUBAL LIGATION       Current Meds  Medication Sig   ALPRAZolam (XANAX) 0.25 MG tablet Take 1 tablet (0.25 mg total) by mouth daily as needed. for anxiety   atomoxetine (STRATTERA) 25 MG capsule TAKE 1 CAPSULE BY MOUTH EVERY DAY   BIOTIN PO Take 2,500 mcg by mouth daily. Gummies   dexlansoprazole (DEXILANT) 60 MG capsule Take 1 capsule (60 mg total) by mouth daily.   estradiol (ESTRACE) 1 MG tablet Take 1 mg by mouth daily.   lithium 300 MG tablet Take 2 tab in am, 2 at noon and 2 tab in evening   lurasidone (LATUDA) 80 MG TABS tablet Take 1 tablet (80 mg total) by mouth at bedtime.   nitrofurantoin (MACRODANTIN) 50 MG capsule Take 50 mg by mouth daily as needed (UTI).    traZODone (DESYREL) 150 MG tablet Take 100 mg by mouth at bedtime.   valACYclovir (VALTREX) 500 MG tablet valacyclovir 500 mg tablet  TAKE 1 TABLET (500 MG TOTAL) BY MOUTH THREE (3) TIMES A DAY.   vitamin B-12 (CYANOCOBALAMIN) 1000 MCG tablet Take 1,000 mcg by mouth daily.     Family  History  Problem Relation Age of Onset   Alcohol abuse Paternal Grandfather    Hypertension Mother    Kidney disease Father    Hypertension Father    Coronary artery disease Father    Breast cancer Paternal Grandmother        post menopausal   Colon cancer Neg Hx     Social History   Socioeconomic History   Marital status: Married    Spouse name: Not on file   Number of children: Not on file   Years of education: Not on file   Highest education level: Not on file  Occupational History   Occupation: Disability    Comment: since 2010  Tobacco Use   Smoking status: Former    Packs/day: 1.00    Years: 30.00    Pack years: 30.00    Types: Cigarettes    Quit date: 11/04/2012    Years since quitting: 8.3   Smokeless tobacco: Never  Vaping Use   Vaping Use: Never used  Substance and Sexual Activity   Alcohol use: No  Alcohol/week: 0.0 standard drinks   Drug use: No   Sexual activity: Yes    Partners: Male    Birth control/protection: None  Other Topics Concern   Not on file  Social History Narrative   Abagayle was born and grew up in [place], [place]. Her parents divorced when she was 69 years of age, and she grew up with her paternal grandmother and grandfather. Her grandfather was an alcoholic, and Brinlyn found him dead of an MI at the age of 35. Her mother used to lock her in a dark bathroom for an hour or so very frequently, and Nyiah continues to have phobias associated with opening a bathroom door. Kaedyn graduated from [employer], but has had no real career because she was unable to hold a job. She has been married for 71 years and has 3 boys, twins age 31 and her oldest son age 33. 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 She is currently attending Alcoholics Anonymous meetings 3 times a week, has a sponsor, and is working the 12 steps.   Social Determinants of Health   Financial Resource Strain: Not on file  Food Insecurity:  Not on file  Transportation Needs: Not on file  Physical Activity: Not on file  Stress: Not on file  Social Connections: Not on file       Review of Systems: Gen: Denies fever, chills, cold or flulike symptoms, presyncope, syncope. CV: Denies chest pain, palpitations Resp: Denies dyspnea or cough. GI: see HPI Heme: See HPI  Observations/Objective: No distress. Alert and oriented. Pleasant. Well nourished. Normal mood and affect. Unable to perform complete physical exam due to video encounter.    Assessment:  51 year old female presenting today for follow-up of GERD and dysphagia.  GERD:  Chronic.  Well-controlled on Dexilant 60 mg daily unless eating known dietary trigger or eating late at night, but this is rare.   Dysphagia:  Recurrent.  Underwent EGD in March 2022 revealing small hiatal hernia, Schatzki's ring s/p dilation, gastritis biopsied, normal examined duodenum.  Biopsies were negative for H. pylori, metaplasia, or dysplasia.  She reports esophageal dilation was helpful for 6-7 months, but then symptoms are returning and are now occurring daily.  Solid foods will get hung in her esophagus.  Occasionally she feels liquids will go down the wrong way or that she has to make an effort to swallow.  Dysphagia may be multifactorial.  Query recurrent Schatzki's ring and possible oropharyngeal component/dysmotility as well.  We will plan for repeat EGD with possible esophageal dilation.  If persistent symptoms thereafter, consider BPE.  LLQ pain:  Chronic intermittent soreness/tightness in the left lower quadrant for years without known aggravating or relieving factors.  No association with meals or bowel movements.  Bowels are moving well without BRBPR or melena.  Overall, reports this is not very bothersome.  Query adhesions in the setting of multiple abdominal surgeries including subtotal colectomy, appendectomy, hysterectomy. We will have her follow-up in the office in a few months  for further evaluation and abdominal exam.  She will call if any worsening symptoms.  Flex sig is up-to-date in 2016 with recommendations to repeat in 10 years.   Plan: EGD with possible esophageal dilation with propofol with Dr. [name] in the near future. The risks, benefits, and alternatives have been discussed with the patient in detail. The patient states understanding and desires to proceed. ASA 2 Continue Dexilant 60 mg daily. Reinforced GERD  diet/lifestyle.  Separate written instructions provided. Follow-up in 3 months or sooner if needed.    I discussed the assessment and treatment plan with the patient. The patient was provided an opportunity to ask questions and all were answered. The patient agreed with the plan and demonstrated an understanding of the instructions.   The patient was advised to call back or seek an in-person evaluation if the symptoms worsen or if the condition fails to improve as anticipated.  I provided 13 minutes of video-face-to-face time during this encounter.  Aliene Altes, PA-C [facility]

## 2021-03-12 ENCOUNTER — Telehealth: Payer: Self-pay | Admitting: *Deleted

## 2021-03-12 ENCOUNTER — Telehealth (INDEPENDENT_AMBULATORY_CARE_PROVIDER_SITE_OTHER): Payer: 59 | Admitting: Gastroenterology

## 2021-03-12 ENCOUNTER — Telehealth (HOSPITAL_BASED_OUTPATIENT_CLINIC_OR_DEPARTMENT_OTHER): Payer: 59 | Admitting: Psychiatry

## 2021-03-12 ENCOUNTER — Encounter (HOSPITAL_COMMUNITY): Payer: Self-pay | Admitting: Psychiatry

## 2021-03-12 ENCOUNTER — Encounter: Payer: Self-pay | Admitting: Gastroenterology

## 2021-03-12 ENCOUNTER — Other Ambulatory Visit: Payer: Self-pay

## 2021-03-12 VITALS — Ht 63.0 in | Wt 125.0 lb

## 2021-03-12 VITALS — Wt 127.0 lb

## 2021-03-12 DIAGNOSIS — R1319 Other dysphagia: Secondary | ICD-10-CM

## 2021-03-12 DIAGNOSIS — F250 Schizoaffective disorder, bipolar type: Secondary | ICD-10-CM

## 2021-03-12 DIAGNOSIS — K219 Gastro-esophageal reflux disease without esophagitis: Secondary | ICD-10-CM | POA: Diagnosis not present

## 2021-03-12 DIAGNOSIS — F410 Panic disorder [episodic paroxysmal anxiety] without agoraphobia: Secondary | ICD-10-CM | POA: Diagnosis not present

## 2021-03-12 DIAGNOSIS — F900 Attention-deficit hyperactivity disorder, predominantly inattentive type: Secondary | ICD-10-CM | POA: Diagnosis not present

## 2021-03-12 DIAGNOSIS — R1032 Left lower quadrant pain: Secondary | ICD-10-CM

## 2021-03-12 MED ORDER — LITHIUM CARBONATE 300 MG PO TABS: ORAL_TABLET | ORAL | 2 refills | Status: DC

## 2021-03-12 MED ORDER — LURASIDONE HCL 80 MG PO TABS: 80.0000 mg | ORAL_TABLET | Freq: Every day | ORAL | 2 refills | Status: DC

## 2021-03-12 MED ORDER — DOXEPIN HCL 10 MG PO CAPS: 10.0000 mg | ORAL_CAPSULE | Freq: Every day | ORAL | 2 refills | Status: DC

## 2021-03-12 MED ORDER — ATOMOXETINE HCL 25 MG PO CAPS: ORAL_CAPSULE | ORAL | 2 refills | Status: DC

## 2021-03-12 NOTE — Patient Instructions (Signed)
We will arrange for you to have an upper endoscopy with possible dilation of your esophagus in the near future with Dr. [name]. ? ?Continue Dexilant 60 mg daily. ? ?Follow a GERD diet:  ?Avoid fried, fatty, greasy, spicy, citrus foods. ?Avoid caffeine and carbonated beverages. ?Avoid chocolate. ?Try eating 4-6 small meals a day rather than 3 large meals. ?Do not eat within 3 hours of laying down. ?Prop head of bed up on wood or bricks to create a 6 inch incline. ? ?It was good to meet you today! We will follow-up with you in 3 months.  ? ?Aliene Altes, PA-C ?[facility] ? ? ?

## 2021-03-12 NOTE — Progress Notes (Signed)
Virtual Visit via Telephone Note ? ?I connected with Ruth Duncan on 03/12/21 at  3:00 PM EST by telephone and verified that I am speaking with the correct person using two identifiers. ? ?Location: ?Patient: In Restaurant ?Provider: Office ?  ?I discussed the limitations, risks, security and privacy concerns of performing an evaluation and management service by telephone and the availability of in person appointments. I also discussed with the patient that there may be a patient responsible charge related to this service. The patient expressed understanding and agreed to proceed. ? ? ?History of Present Illness: ?Patient is evaluated by telephone.  She is in a restaurant eating lunch with her friends.  She reported new medicine helped her a lot and she is sleeping at least 6 hours.  She reported her family situation is better and she has not had any issues from her ex husband.  She has not filed 50B because she feels things are much calmer.  She also reported her son is doing better and taking the medication.  She is trying to come off from Xanax and had cut down significantly and last week she only took 2 pills.  She denies any paranoia, hallucination, anger, mood swings or any suicidal thoughts.  Her appetite is okay.  Her weight is stable.  She is compliant with Strattera which is helping her ADD symptoms.  She also taking Latuda and lithium.  She denies any highs or lows or any impulsive behavior.  She has no tremor or shakes or any EPS.  She denies any major panic attack ? ?Past Psychiatric History:  ?H/O alcohol, cocaine and inpatient at [facility] in 2007 and rehabilitation at [facility]. Tried Strattera, Prozac, Trilafon, Abilify, Lamictal and Trazadone. No h/o suicidal attempt.  H/O trouble with the law later probation. H/O paranoia, hallucination, anxiety. ? ?Psychiatric Specialty Exam: ?Physical Exam  ?Review of Systems  ?There were no vitals taken for this visit.There is no height or weight on file to  calculate BMI.  ?General Appearance: NA  ?Eye Contact:  NA  ?Speech:  Clear and Coherent and Normal Rate  ?Volume:  Normal  ?Mood:  Euthymic  ?Affect:  NA  ?Thought Process:  Goal Directed  ?Orientation:  Full (Time, Place, and Person)  ?Thought Content:  WDL  ?Suicidal Thoughts:  No  ?Homicidal Thoughts:  No  ?Memory:  Immediate;   Good ?Recent;   Good ?Remote;   Good  ?Judgement:  Intact  ?Insight:  Present  ?Psychomotor Activity:  NA  ?Concentration:  Concentration: Good and Attention Span: Fair  ?Recall:  Good  ?Fund of Knowledge:  Good  ?Language:  Good  ?Akathisia:  No  ?Handed:  Right  ?AIMS (if indicated):     ?Assets:  Communication Skills ?Desire for Improvement ?Housing ?Resilience ?Social Support ?Transportation  ?ADL's:  Intact  ?Cognition:  WNL  ?Sleep:   6 hrs  ? ? ? ? ?Assessment and Plan: ?Schizoaffective disorder, bipolar type.  ADD, inattentive type.  Panic attacks. ? ?Patient doing better with doxepin as she is sleeping better.  Her family stress is not as bad and she is handling it much better than she anticipated.  We will continue lithium 600 mg 3 times a day, Latuda 80 mg daily, doxepin 10 mg daily and Strattera 25 mg daily.  Recommended to call us back if there is any question of any concern.  Follow-up in 3 months. ? ?Follow Up Instructions: ? ?  ?I discussed the assessment and treatment plan  with the patient. The patient was provided an opportunity to ask questions and all were answered. The patient agreed with the plan and demonstrated an understanding of the instructions. ?  ?The patient was advised to call back or seek an in-person evaluation if the symptoms worsen or if the condition fails to improve as anticipated. ? ?I provided 23 minutes of non-face-to-face time during this encounter. ? ? ?Kathlee Nations, MD  ?

## 2021-03-12 NOTE — Telephone Encounter (Signed)
Ruth Duncan, you are scheduled for a virtual visit with your provider today.  Just as we do with appointments in the office, we must obtain your consent to participate.  Your consent will be active for this visit and any virtual visit you may have with one of our providers in the next 365 days.  If you have a MyChart account, I can also send a copy of this consent to you electronically.  All virtual visits are billed to your insurance company just like a traditional visit in the office.  As this is a virtual visit, video technology does not allow for your provider to perform a traditional examination.  This may limit your provider's ability to fully assess your condition.  If your provider identifies any concerns that need to be evaluated in person or the need to arrange testing such as labs, EKG, etc, we will make arrangements to do so.  Although advances in technology are sophisticated, we cannot ensure that it will always work on either your end or our end.  If the connection with a video visit is poor, we may have to switch to a telephone visit.  With either a video or telephone visit, we are not always able to ensure that we have a secure connection.   I need to obtain your verbal consent now.   Are you willing to proceed with your visit today?  ?

## 2021-03-12 NOTE — Telephone Encounter (Signed)
Called pt. She has been scheduled for EGD +/-dil w/ propofol asa 2 on 3/21 at 2:30pm. Aware will send instructions.  ? ? ?PA approved via [employer]. Auth# [id number], DOS: Mar 24, 2021 - Jun 22, 2021 ?

## 2021-03-12 NOTE — Telephone Encounter (Signed)
Pt consented to a virtual visit. 

## 2021-03-23 ENCOUNTER — Telehealth: Payer: Self-pay | Admitting: Internal Medicine

## 2021-03-23 NOTE — Telephone Encounter (Signed)
Pt wants to reschedule her EGD for tomorrow with Dr [name]. She said her son has covid and she is having symptoms and wants to cancel. [phone] ?

## 2021-03-23 NOTE — Telephone Encounter (Signed)
Called pt. On schedule for tomorrow. She has been rescheduled to 4/25 at [place] will mail new instructions. Message sent to endo. ?

## 2021-04-01 ENCOUNTER — Telehealth (HOSPITAL_COMMUNITY): Payer: Self-pay | Admitting: *Deleted

## 2021-04-01 NOTE — Telephone Encounter (Signed)
Latuda and Wellbutrin are 2 different medication.  How much she had weight gain from the [place].  If this is the only reason that she like to stop then she may need to watch her calorie intake.  If she still wanted to switch to [place] and she had tried Haldol in the past and we can try 2 mg Haldol.   ?

## 2021-04-01 NOTE — Telephone Encounter (Signed)
Writer spoke with pt who called requesting to restart Wellbutrin and d/c Latuda due to weight gain. Pt does not want to take the Latuda however knows that she can't just stop it she says and states that Wellbutrin worked well for her in the past.  I do not see Wellbutrin in her medication history in EMR as prescribed by you or at all. Pt is curently on Latuda, Lithium, and Doxepin, and Strattera. Pt next appointment is scheduled for 06/11/21. Please review and advise. ?

## 2021-04-06 ENCOUNTER — Other Ambulatory Visit (HOSPITAL_COMMUNITY): Payer: Self-pay | Admitting: *Deleted

## 2021-04-06 MED ORDER — BUPROPION HCL ER (XL) 150 MG PO TB24: 150.0000 mg | ORAL_TABLET | ORAL | 2 refills | Status: DC

## 2021-04-08 ENCOUNTER — Other Ambulatory Visit (HOSPITAL_COMMUNITY): Payer: Self-pay | Admitting: Psychiatry

## 2021-04-08 DIAGNOSIS — F410 Panic disorder [episodic paroxysmal anxiety] without agoraphobia: Secondary | ICD-10-CM

## 2021-04-27 ENCOUNTER — Telehealth: Payer: Self-pay | Admitting: Internal Medicine

## 2021-04-27 NOTE — Telephone Encounter (Signed)
Patient is sick and needs to reschedule her procedure  ?

## 2021-04-27 NOTE — Telephone Encounter (Signed)
PA previously done. PA# [id number], valid 03/24/21-06/22/21. ?

## 2021-04-27 NOTE — Telephone Encounter (Signed)
Called pt, EGD/-/+DIL rescheduled to 05/26/21 at 12:30pm. New instructions mailed. Endo scheduler informed. ?

## 2021-04-29 ENCOUNTER — Other Ambulatory Visit (HOSPITAL_COMMUNITY): Payer: Self-pay | Admitting: Psychiatry

## 2021-05-18 ENCOUNTER — Encounter: Payer: Self-pay | Admitting: Internal Medicine

## 2021-05-25 ENCOUNTER — Telehealth: Payer: Self-pay | Admitting: Internal Medicine

## 2021-05-25 NOTE — Telephone Encounter (Signed)
Spoke to pt, she will be seeing cardiology. Advised her she will need OV to reschedule procedure. Pt ok with OV. Endo scheduler informed to cancel procedure for tomorrow.  Manuela Schwartz, please schedule OV few months out to allow her time to see cardiology.

## 2021-05-25 NOTE — Telephone Encounter (Signed)
Reviewed. Agree, patient needs OV in a few months to re-group.

## 2021-05-25 NOTE — Telephone Encounter (Signed)
Pt is scheduled with Dr [name] tomorrow and needs to cancel due to heart problems. [phone]

## 2021-05-26 ENCOUNTER — Encounter (HOSPITAL_COMMUNITY): Admission: RE | Payer: Self-pay | Source: Home / Self Care

## 2021-05-26 ENCOUNTER — Ambulatory Visit (HOSPITAL_COMMUNITY): Admission: RE | Admit: 2021-05-26 | Payer: 59 | Source: Home / Self Care

## 2021-05-26 SURGERY — ESOPHAGOGASTRODUODENOSCOPY (EGD) WITH PROPOFOL
Anesthesia: Monitor Anesthesia Care

## 2021-05-26 NOTE — Telephone Encounter (Signed)
Pt added to recall to follow up in July

## 2021-05-27 ENCOUNTER — Other Ambulatory Visit (HOSPITAL_COMMUNITY): Payer: Self-pay | Admitting: Psychiatry

## 2021-06-02 ENCOUNTER — Encounter: Payer: Self-pay | Admitting: Internal Medicine

## 2021-06-10 ENCOUNTER — Telehealth (HOSPITAL_COMMUNITY): Payer: Self-pay | Admitting: *Deleted

## 2021-06-10 DIAGNOSIS — F410 Panic disorder [episodic paroxysmal anxiety] without agoraphobia: Secondary | ICD-10-CM

## 2021-06-10 MED ORDER — ALPRAZOLAM 0.25 MG PO TABS: 0.2500 mg | ORAL_TABLET | Freq: Every day | ORAL | 0 refills | Status: DC | PRN

## 2021-06-10 NOTE — Telephone Encounter (Signed)
Pt called requesting to start taking Xanax again due "major stress" and a lot going on in her life she says. Pt describes having frequent panic attacks and recently went to ED because she thought she was having an MI. Pt actually has an appointment tomorrow @ 1500 but does not feel that she can wait until then. Please review and advise.

## 2021-06-10 NOTE — Telephone Encounter (Signed)
I will send 10 tablets to her pharmacy [facility] [place].  We will discuss more on her appointment tomorrow.

## 2021-06-11 ENCOUNTER — Ambulatory Visit (HOSPITAL_BASED_OUTPATIENT_CLINIC_OR_DEPARTMENT_OTHER): Payer: 59 | Admitting: Psychiatry

## 2021-06-11 ENCOUNTER — Other Ambulatory Visit (HOSPITAL_COMMUNITY): Payer: Self-pay | Admitting: *Deleted

## 2021-06-11 ENCOUNTER — Encounter (HOSPITAL_COMMUNITY): Payer: Self-pay | Admitting: Psychiatry

## 2021-06-11 VITALS — BP 124/85 | HR 67 | Resp 8 | Ht 63.0 in | Wt 124.4 lb

## 2021-06-11 DIAGNOSIS — F900 Attention-deficit hyperactivity disorder, predominantly inattentive type: Secondary | ICD-10-CM

## 2021-06-11 DIAGNOSIS — Z5181 Encounter for therapeutic drug level monitoring: Secondary | ICD-10-CM

## 2021-06-11 DIAGNOSIS — F410 Panic disorder [episodic paroxysmal anxiety] without agoraphobia: Secondary | ICD-10-CM | POA: Diagnosis not present

## 2021-06-11 DIAGNOSIS — F250 Schizoaffective disorder, bipolar type: Secondary | ICD-10-CM

## 2021-06-11 DIAGNOSIS — Z79899 Other long term (current) drug therapy: Secondary | ICD-10-CM

## 2021-06-11 MED ORDER — BUPROPION HCL ER (XL) 150 MG PO TB24: 150.0000 mg | ORAL_TABLET | ORAL | 1 refills | Status: DC

## 2021-06-11 MED ORDER — ALPRAZOLAM 0.25 MG PO TABS: 0.2500 mg | ORAL_TABLET | Freq: Every day | ORAL | 0 refills | Status: DC | PRN

## 2021-06-11 MED ORDER — ATOMOXETINE HCL 25 MG PO CAPS: ORAL_CAPSULE | ORAL | 2 refills | Status: DC

## 2021-06-11 MED ORDER — DOXEPIN HCL 10 MG PO CAPS: 10.0000 mg | ORAL_CAPSULE | Freq: Every day | ORAL | 1 refills | Status: DC

## 2021-06-11 MED ORDER — LITHIUM CARBONATE 300 MG PO TABS: ORAL_TABLET | ORAL | 1 refills | Status: DC

## 2021-06-11 NOTE — Progress Notes (Signed)
[place] MD/PA/NP OP Progress Note  06/11/2021 3:20 PM SHRILEY JOFFE  MRN:  [id number]    HPI: Patient came for her follow-up appointment.  This is a in person visit.  She is having a lot of stress and anxiety because of family situation.  One of her son has mental breakdown and now his wife left him and patient is taking care of his 2 kids who are 68 and 52-year-old.  Patient is feeling overwhelmed and feeling very anxious.  She does not sleep very well.  She is taking care of her 3 son and her grandkids.  She admitted having panic attacks, irritability.  We have started her on Latuda but she stopped it due to after not feeling well and back on Wellbutrin.  She has chronic hallucination but they are stable.  Her biggest concern is anxiety and having panic attacks.  We have given Xanax which she usually takes when she has severe panic attack.  Patient is going through a custody battle because her son's wife left him and living in [place].  She has to spend money to lawyer to get custody battle.  Her other stressor is her ex started drinking again and she is trying to put a hold on divorce until she resolve her other issues in the family.  Her attention concentration is okay.  She is taking the Strattera which is keeping her focus and multitasking.  She is taking Sinequan 10 mg.  She is taking lithium which is helping her mood swings and anger and agitation.  Her appetite is okay.  Her weight is stable.   Visit Diagnosis:    ICD-10-CM   1. Schizoaffective disorder, bipolar type ([place])  F25.0 lithium 300 MG tablet    doxepin (SINEQUAN) 10 MG capsule    buPROPion (WELLBUTRIN XL) 150 MG 24 hr tablet    2. Panic attack  F41.0 doxepin (SINEQUAN) 10 MG capsule    ALPRAZolam (XANAX) 0.25 MG tablet    3. Attention deficit hyperactivity disorder (ADHD), predominantly inattentive type  F90.0 atomoxetine (STRATTERA) 25 MG capsule      Past Psychiatric History:  H/O alcohol, cocaine and inpatient at [facility] in 2007 and  rehabilitation at [facility]. Tried Strattera, Prozac, Trilafon, Abilify, Lamictal, Latuda and Trazadone. No h/o suicidal attempt.  H/O trouble with the law later probation. H/O paranoia, hallucination, anxiety.    Past Medical History:  Past Medical History:  Diagnosis Date   Bipolar disorder ([place])    Chronic abdominal pain    hx of IBS-D, ? bile salt-induced diarrhea   Chronic eczema of hand 2012   BILATERAL ON MTX SINCE 5003   Complication of anesthesia    Diverticulitis    GERD (gastroesophageal reflux disease)    History of kidney stones    Ovarian cancer ([place]) 2010   PONV (postoperative nausea and vomiting)    Surgical menopause 2010   Tubular adenoma 2010    Past Surgical History:  Procedure Laterality Date   ABDOMINAL HYSTERECTOMY  2010   APPENDECTOMY  2004   BACTERIAL OVERGROWTH TEST N/A 09/01/2015   Procedure: BACTERIAL OVERGROWTH TEST;  Surgeon: Danie Binder, MD;  [facility];  Service: Endoscopy;  Laterality: N/A;  [place] N/A 03/31/2020   Procedure: BALLOON DILATION;  Surgeon: Eloise Harman, DO;  [facility];  Service: Endoscopy;  Laterality: N/A;   CESAREAN SECTION  1998   COLONOSCOPY  12/2008   tubular adenomas, negative microscopic colitis.  Due for Flex Sig Dec 2015   ESOPHAGOGASTRODUODENOSCOPY  05/06/2008   ASN:[id number] esophagus without evidence of Barrett's, mass, erosion, ulceration or stricture/ Hiatal hernia/ Normal duodenal bulb and second portion of the duodenum   ESOPHAGOGASTRODUODENOSCOPY N/A 10/04/2014   Dr. Oneida Alar: patent stricture at GE junction, no dilation, small hiatal hernia, mild non-erosive gastritis (benign), multiple small gastric polyps, benign. Capsule study recommended but never completed    ESOPHAGOGASTRODUODENOSCOPY (EGD) WITH PROPOFOL N/A 04/10/2019   Procedure: ESOPHAGOGASTRODUODENOSCOPY (EGD) WITH PROPOFOL;  Surgeon: Danie Binder, MD;  [facility];  Service: Endoscopy;   Laterality: N/A;  1:15pm-moved to 4/6 @ 2:45pm   ESOPHAGOGASTRODUODENOSCOPY (EGD) WITH PROPOFOL N/A 03/31/2020   Procedure: ESOPHAGOGASTRODUODENOSCOPY (EGD) WITH PROPOFOL;  Surgeon: Eloise Harman, DO;  [facility];  Service: Endoscopy;  Laterality: N/A;  am   FLEXIBLE SIGMOIDOSCOPY N/A 02/18/2014   SLF: Normal small bowel. formed stool at anastomosis. Stool cleared with irrigation. One superfical erosion at the anastomosis. otherwise normal. normal rectal mucosa   SAVORY DILATION N/A 04/10/2019   Procedure: SAVORY DILATION;  Surgeon: Danie Binder, MD;  [facility];  Service: Endoscopy;  Laterality: N/A;   SUBTOTAL COLECTOMY  2011   TUBAL LIGATION      Family Psychiatric History: Reviewed  Family History:  Family History  Problem Relation Age of Onset   Alcohol abuse Paternal Grandfather    Hypertension Mother    Kidney disease Father    Hypertension Father    Coronary artery disease Father    Breast cancer Paternal Grandmother        post menopausal   Colon cancer Neg Hx     Social History:  Social History   Socioeconomic History   Marital status: Married    Spouse name: Not on file   Number of children: Not on file   Years of education: Not on file   Highest education level: Not on file  Occupational History   Occupation: Disability    Comment: since 2010  Tobacco Use   Smoking status: Former    Packs/day: 1.00    Years: 30.00    Total pack years: 30.00    Types: Cigarettes    Quit date: 11/04/2012    Years since quitting: 8.6   Smokeless tobacco: Never  Vaping Use   Vaping Use: Never used  Substance and Sexual Activity   Alcohol use: No    Alcohol/week: 0.0 standard drinks of alcohol   Drug use: No   Sexual activity: Yes    Partners: Male    Birth control/protection: None  Other Topics Concern   Not on file  Social History Narrative   Carli was born and grew up in [place], [place]. Her parents divorced when she was 32  years of age, and she grew up with her paternal grandmother and grandfather. Her grandfather was an alcoholic, and Klee found him dead of an MI at the age of 18. Her mother used to lock her in a dark bathroom for an hour or so very frequently, and Vielka continues to have phobias associated with opening a bathroom door. Desirre graduated from [employer], but has had no real career because she was unable to hold a job. She has been married for 62 years and has 3 boys, twins age 57 and her oldest son age 71. 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 She is currently attending Alcoholics Anonymous meetings 3 times  a week, has a sponsor, and is working the 12 steps.   Social Determinants of Health   Financial Resource Strain: Not on file  Food Insecurity: Not on file  Transportation Needs: Not on file  Physical Activity: Not on file  Stress: Not on file  Social Connections: Not on file    Allergies: No Known Allergies  Metabolic Disorder Labs: Lab Results  Component Value Date   HGBA1C 5.4 10/23/2019   MPG 108 10/23/2019   MPG 105 10/02/2018   No results found for: "PROLACTIN" No results found for: "CHOL", "TRIG", "HDL", "CHOLHDL", "VLDL", "LDLCALC" Lab Results  Component Value Date   TSH 1.52 10/23/2019   TSH 1.755 04/17/2013    Therapeutic Level Labs: Lab Results  Component Value Date   LITHIUM <0.1 (L) 11/04/2020   LITHIUM <0.3 (L) 10/23/2019   No results found for: "VALPROATE" No results found for: "CBMZ"  Current Medications: Current Outpatient Medications  Medication Sig Dispense Refill   ALPRAZolam (XANAX) 0.25 MG tablet Take 1 tablet (0.25 mg total) by mouth daily as needed. for anxiety 10 tablet 0   atomoxetine (STRATTERA) 25 MG capsule TAKE 1 CAPSULE BY MOUTH EVERY DAY 30 capsule 2   BIOTIN PO Take 2,500 mcg by mouth daily. Gummies     buPROPion (WELLBUTRIN XL) 150 MG 24 hr tablet Take 1 tablet (150 mg total) by mouth every  morning. 30 tablet 2   dexlansoprazole (DEXILANT) 60 MG capsule Take 1 capsule (60 mg total) by mouth daily. 90 capsule 3   doxepin (SINEQUAN) 10 MG capsule Take 1 capsule (10 mg total) by mouth at bedtime. 30 capsule 2   estradiol (ESTRACE) 0.1 MG/GM vaginal cream Place 1 g vaginally at bedtime.     estradiol (ESTRACE) 1 MG tablet Take 1 mg by mouth daily.     lithium 300 MG tablet Take 2 tab in am, 2 at noon and 2 tab in evening 180 tablet 2   nitrofurantoin (MACRODANTIN) 50 MG capsule Take 50 mg by mouth daily as needed (UTI).   5   valACYclovir (VALTREX) 500 MG tablet Take 500 mg by mouth daily as needed (outbreaks).     vitamin B-12 (CYANOCOBALAMIN) 1000 MCG tablet Take 1,000 mcg by mouth daily.     No current facility-administered medications for this visit.     Musculoskeletal: Strength & Muscle Tone: within normal limits Gait & Station: normal Patient leans: N/A  Psychiatric Specialty Exam: Review of Systems  Blood pressure 124/85, pulse 67, resp. rate (!) 8, height '5\' 3"'$  (1.6 m), weight 124 lb 6.4 oz (56.4 kg), SpO2 98 %.There is no height or weight on file to calculate BMI.  General Appearance: Casual  Eye Contact:  Good  Speech:   fast  Volume:  Normal  Mood:  Anxious, Depressed, and Dysphoric  Affect:  Constricted and Depressed  Thought Process:  Goal Directed  Orientation:  Full (Time, Place, and Person)  Thought Content: Rumination   Suicidal Thoughts:  No  Homicidal Thoughts:  No  Memory:  Immediate;   Good Recent;   Good Remote;   Good  Judgement:  Intact  Insight:  Present  Psychomotor Activity:  Increased  Concentration:  Concentration: Good and Attention Span: Good  Recall:  Good  Fund of Knowledge: Good  Language: Good  Akathisia:  No  Handed:  Right  AIMS (if indicated): not done  Assets:  Communication Skills Desire for Improvement Intimacy Resilience Talents/Skills Transportation  ADL's:  Intact  Cognition: WNL  Sleep:  Poor    Screenings: Flowsheet Row Video Visit from 04/22/2020 in [facility] Admission (Discharged) from 03/31/2020 in [facility] No Risk Error: Question 6 not populated        Assessment and Plan: Schizoaffective disorder, bipolar type.  ADD, inattentive type.  Panic attacks.  I review psychosocial stressors.  Patient having a lot of family stress and now taking care of her son who recently discharged from mental health and his 56-year-old and 43-year-old kids.  She also taking care of her other son's.  She feeling overwhelmed.  We have provided Xanax to help her panic attack.  I recommend to try doxepin 20 mg at bedtime to help her sleep.  Continue lithium 6 mg 3 times a day, Wellbutrin XL 150 mg daily, Strattera 25 mg daily and we will provide Xanax to take as needed for severe panic attack.  Encourage think about therapy.  Patient has upcoming cruise trip with her friend and she is looking forward for that.  We will order lithium level and comprehensive metabolic panel.  Recommended to call us back if there is any question or any concern.  Follow-up in 6 to 8 weeks.    Collaboration of Care: Collaboration of Care: Primary Care Provider AEB notes are available in epic to review.  Patient/Guardian was advised Release of Information must be obtained prior to any record release in order to collaborate their care with an outside provider. Patient/Guardian was advised if they have not already done so to contact the registration department to sign all necessary forms in order for [place] to release information regarding their care.   Consent: Patient/Guardian gives verbal consent for treatment and assignment of benefits for services provided during this visit. Patient/Guardian expressed understanding and agreed to proceed.    Kathlee Nations, MD 06/11/2021, 3:20 PM

## 2021-06-15 ENCOUNTER — Telehealth (HOSPITAL_COMMUNITY): Payer: Self-pay | Admitting: *Deleted

## 2021-06-15 NOTE — Telephone Encounter (Signed)
Yes.  Thank you.

## 2021-06-15 NOTE — Telephone Encounter (Signed)
Pt called requesting a patch for motion sickness as she is going on a cruise soon. Writer advised that she is able to get OTC patches or advised to call PCP. Pt agrees.
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
## 2021-07-01 ENCOUNTER — Telehealth (INDEPENDENT_AMBULATORY_CARE_PROVIDER_SITE_OTHER): Payer: 59 | Admitting: Gastroenterology

## 2021-07-01 ENCOUNTER — Encounter: Payer: Self-pay | Admitting: Gastroenterology

## 2021-07-01 VITALS — Ht 63.0 in | Wt 126.0 lb

## 2021-07-01 DIAGNOSIS — R1310 Dysphagia, unspecified: Secondary | ICD-10-CM | POA: Diagnosis not present

## 2021-07-01 DIAGNOSIS — R079 Chest pain, unspecified: Secondary | ICD-10-CM | POA: Diagnosis not present

## 2021-07-01 DIAGNOSIS — R195 Other fecal abnormalities: Secondary | ICD-10-CM

## 2021-07-01 DIAGNOSIS — K219 Gastro-esophageal reflux disease without esophagitis: Secondary | ICD-10-CM | POA: Diagnosis not present

## 2021-07-01 MED ORDER — DICYCLOMINE HCL 10 MG PO CAPS: 10.0000 mg | ORAL_CAPSULE | Freq: Three times a day (TID) | ORAL | 3 refills | Status: DC

## 2021-07-01 NOTE — Patient Instructions (Signed)
Please have blood work completed at [facility]  Start Bentyl 10 mg up to 3 times daily before meals as needed.  I recommend starting with once daily and increasing as needed.  If you develop constipation, hold the medication.  Continue Dexilant 60 mg daily.  Swallowing precautions:  Eat slowly, take small bites, chew thoroughly, drink plenty of liquids throughout meals.  Avoid trough textures All meats should be chopped finely.  If something gets hung in your esophagus and will not come up or go down, proceed to the emergency room.    We are referring you to cardiology to further evaluate your chest pain.  We will plan to see you back in about 3 months.  Hopefully, you have been evaluated by cardiology by that time and we can proceed with scheduling an upper endoscopy.   It was great to see you again today!   Aliene Altes, PA-C [facility] Gastroenterology

## 2021-07-03 ENCOUNTER — Other Ambulatory Visit (HOSPITAL_COMMUNITY): Payer: Self-pay

## 2021-07-03 ENCOUNTER — Ambulatory Visit (INDEPENDENT_AMBULATORY_CARE_PROVIDER_SITE_OTHER): Payer: 59 | Admitting: Internal Medicine

## 2021-07-03 ENCOUNTER — Encounter: Payer: Self-pay | Admitting: Internal Medicine

## 2021-07-03 ENCOUNTER — Other Ambulatory Visit (HOSPITAL_COMMUNITY)
Admission: RE | Admit: 2021-07-03 | Discharge: 2021-07-03 | Disposition: A | Discharge status: Home / Self Care | Payer: 59 | Attending: Internal Medicine | Admitting: Internal Medicine

## 2021-07-03 VITALS — BP 104/64 | HR 84 | Ht 63.0 in | Wt 123.8 lb

## 2021-07-03 DIAGNOSIS — R0602 Shortness of breath: Secondary | ICD-10-CM

## 2021-07-03 DIAGNOSIS — Z01818 Encounter for other preprocedural examination: Secondary | ICD-10-CM

## 2021-07-03 DIAGNOSIS — R0609 Other forms of dyspnea: Secondary | ICD-10-CM | POA: Diagnosis not present

## 2021-07-03 DIAGNOSIS — R079 Chest pain, unspecified: Secondary | ICD-10-CM

## 2021-07-03 DIAGNOSIS — Z8249 Family history of ischemic heart disease and other diseases of the circulatory system: Secondary | ICD-10-CM | POA: Diagnosis not present

## 2021-07-03 LAB — BASIC METABOLIC PANEL
Anion gap: 7 (ref 5–15)
BUN: 12 mg/dL (ref 6–20)
CO2: 28 mmol/L (ref 22–32)
Calcium: 9 mg/dL (ref 8.9–10.3)
Chloride: 102 mmol/L (ref 98–111)
Creatinine, Ser: 0.55 mg/dL (ref 0.44–1.00)
GFR, Estimated: >60 mL/min (ref >60)
Glucose, Bld: 129 mg/dL — ABNORMAL HIGH (ref 70–99)
Potassium: 3.5 mmol/L (ref 3.5–5.1)
Sodium: 137 mmol/L (ref 135–145)

## 2021-07-03 MED ORDER — IVABRADINE HCL 7.5 MG PO TABS
15.0000 mg | ORAL_TABLET | Freq: Two times a day (BID) | ORAL | 0 refills | Status: DC
  Filled 2021-07-03 – 2021-07-15 (×2): qty 2, 1d supply, fill #0

## 2021-07-03 NOTE — Patient Instructions (Addendum)
Medication Instructions:  Your physician recommends that you continue on your current medications as directed. Please refer to the Current Medication list given to you today.  Take Ivabradine 15 mg 2 hours before cardiac ct  Labwork: BMET today  Testing/Procedures: Your physician has requested that you have cardiac CT. Cardiac computed tomography (CT) is a painless test that uses an x-ray machine to take clear, detailed pictures of your heart. For further information please visit [facility]. Please follow instruction sheet as given.     Your physician has requested that you have an echocardiogram. Echocardiography is a painless test that uses sound waves to create images of your heart. It provides your doctor with information about the size and shape of your heart and how well your heart's chambers and valves are working. This procedure takes approximately one hour. There are no restrictions for this procedure.   Follow-Up:3-5 months   Any Other Special Instructions Will Be Listed Below (If Applicable).  If you need a refill on your cardiac medications before your next appointment, please call your pharmacy        Your cardiac CT will be scheduled at one of the below locations:   [facility] [address] [place], [place] [address] [phone]  [facility] [address] [place], [place] [address] [phone]  If scheduled at [facility], please arrive at the [facility] and Children's Entrance (Entrance C2) of [facility] 30 minutes prior to test start time. You can use the FREE valet parking offered at entrance C (encouraged to control the heart rate for the test)  Proceed to the [facility] Radiology Department (first floor) to check-in and test prep.  All radiology patients and guests should use entrance C2 at [facility], accessed from [facility], even though  the hospital's physical address listed is [address].    If scheduled at [facility], please arrive 15 mins early for check-in and test prep.  Please follow these instructions carefully (unless otherwise directed):  BMET lab work today 07/03/21  On the Night Before the Test: Be sure to Drink plenty of water. Do not consume any caffeinated/decaffeinated beverages or chocolate 12 hours prior to your test. Do not take any antihistamines 12 hours prior to your test. On the Day of the Test: Drink plenty of water until 1 hour prior to the test. Do not eat any food 4 hours prior to the test. You may take your regular medications prior to the test.  Take Ivabradine 15 mg  two hours prior to test.  FEMALES- please wear underwire-free bra if available, avoid dresses & tight clothing   *     After the Test: Drink plenty of water. After receiving IV contrast, you may experience a mild flushed feeling. This is normal. On occasion, you may experience a mild rash up to 24 hours after the test. This is not dangerous. If this occurs, you can take Benadryl 25 mg and increase your fluid intake. If you experience trouble breathing, this can be serious. If it is severe call 911 IMMEDIATELY. If it is mild, please call our office. If you take any of these medications: Glipizide/Metformin, Avandament, Glucavance, please do not take 48 hours after completing test unless otherwise instructed.  We will call to schedule your test 2-4 weeks out understanding that some insurance companies will need an authorization prior to the service being performed.  For non-scheduling related questions, please contact the cardiac imaging nurse navigator should you have any questions/concerns: Marchia Bond, Cardiac Imaging Nurse Navigator Gordy Clement, Cardiac Imaging Nurse Navigator [facility] Direct Office Dial: [phone]   For scheduling needs,  including cancellations and rescheduling, please call [place], [phone].

## 2021-07-03 NOTE — Addendum Note (Signed)
Addended by: Barbarann Ehlers A on: 07/03/2021 02:00 PM   Modules accepted: Orders

## 2021-07-03 NOTE — Progress Notes (Signed)
Cardiology Office Note:    Date:  07/03/2021   ID:  Ruth Duncan, DOB January 20, 1970, MRN [id number]  PCP:  Ruth Halim PA-C   [facility] Providers Cardiologist:  None     Referring MD: Erenest Rasher, PA-C   CC: Chest pressure Consulted for the evaluation of chest pain at the behest of Ms. Ruth Duncan  History of Present Illness:    Ruth Duncan is a 51 y.o. female with a hx of Bipolar and chest and abdominal pain. Is getting a dysphagia work up and has had limited success.  She works as a Physiological scientist. Had one episode chest pressure and episodes of DOE.  She is unclear how much is dysphagia related or stress related: she is going through a divorce, her three kids are living with her one who is also going through a divorce.  Patient notes that she is feeling attacks of chest pressure that bring her to the ground..   Was last feeling well 4 years ago. Able to run races and had no limitations  Last episode was chest pressure where she was advised to call 911.  Has had four episodes one with near syncope.  Improves with breathing exercise and stress techniques from her anxiety and bipolar training- this feels different.  Patient exertion notable for walking with clients; no chest pressure but chest tightness and DOE.  Decreased endurance with with lifting weights or helping clients.  No palpitations, or syncope.  Has been seen by GI for swallowing issues but as she has had chest pressure that comes and goes, triggered by stress and exercise, brought to cardiology.  No history of pre-eclampsia, gestation HTN or gestational DM.  Has history of hyperemesis primigravida.  No Fen-Phen, distant cocaine use (clean since 2012, no further tobacco).   Past Medical History:  Diagnosis Date   Bipolar disorder ([place])    Chronic abdominal pain    hx of IBS-D, ? bile salt-induced diarrhea   Chronic eczema of hand 2012   BILATERAL ON MTX SINCE 5638   Complication of anesthesia     Diverticulitis    GERD (gastroesophageal reflux disease)    History of kidney stones    Ovarian cancer ([place]) 2010   PONV (postoperative nausea and vomiting)    Surgical menopause 2010   Tubular adenoma 2010    Past Surgical History:  Procedure Laterality Date   ABDOMINAL HYSTERECTOMY  01/05/2008   APPENDECTOMY  01/04/2002   BACTERIAL OVERGROWTH TEST N/A 09/01/2015   Procedure: BACTERIAL OVERGROWTH TEST;  Surgeon: Danie Binder, MD;  [facility];  Service: Endoscopy;  Laterality: N/A;  [place] N/A 03/31/2020   Procedure: BALLOON DILATION;  Surgeon: Eloise Harman, DO;  [facility];  Service: Endoscopy;  Laterality: N/A;   CESAREAN SECTION  01/05/1996   COLONOSCOPY  12/04/2008   tubular adenomas, negative microscopic colitis. Due for Flex Sig Dec 2015   ESOPHAGOGASTRODUODENOSCOPY  05/06/2008   VFI:EPPIRJ esophagus without evidence of Barrett's, mass, erosion, ulceration or stricture/ Hiatal hernia/ Normal duodenal bulb and second portion of the duodenum   ESOPHAGOGASTRODUODENOSCOPY N/A 10/04/2014   Dr. Oneida Alar: patent stricture at GE junction, no dilation, small hiatal hernia, mild non-erosive gastritis (benign), multiple small gastric polyps, benign. Capsule study recommended but never completed    ESOPHAGOGASTRODUODENOSCOPY (EGD) WITH PROPOFOL N/A 04/10/2019   Procedure: ESOPHAGOGASTRODUODENOSCOPY (EGD) WITH PROPOFOL;  Surgeon: Danie Binder, MD;  [facility];  Service: Endoscopy;  Laterality: N/A;  1:15pm-moved to 4/6 @ 2:45pm   ESOPHAGOGASTRODUODENOSCOPY (EGD) WITH PROPOFOL N/A 03/31/2020   Surgeon: Eloise Harman, DO; small hiatal hernia, Schatzki's ring dilated, gastritis biopsied, normal examined duodenum.  Biopsies negative for H. pylori.   FLEXIBLE SIGMOIDOSCOPY N/A 02/18/2014   SLF: Normal small bowel. formed stool at anastomosis. Stool cleared with irrigation. One superfical erosion at the anastomosis. otherwise  normal. normal rectal mucosa   SAVORY DILATION N/A 04/10/2019   Procedure: SAVORY DILATION;  Surgeon: Danie Binder, MD;  [facility];  Service: Endoscopy;  Laterality: N/A;   SUBTOTAL COLECTOMY  01/04/2009   TUBAL LIGATION      Current Medications: Current Meds  Medication Sig   ALPRAZolam (XANAX) 0.25 MG tablet Take 1 tablet (0.25 mg total) by mouth daily as needed. for anxiety   atomoxetine (STRATTERA) 25 MG capsule TAKE 1 CAPSULE BY MOUTH EVERY DAY   buPROPion (WELLBUTRIN XL) 150 MG 24 hr tablet Take 1 tablet (150 mg total) by mouth every morning.   dexlansoprazole (DEXILANT) 60 MG capsule Take 1 capsule (60 mg total) by mouth daily.   dicyclomine (BENTYL) 10 MG capsule Take 1 capsule (10 mg total) by mouth 3 (three) times daily before meals.   doxepin (SINEQUAN) 10 MG capsule Take 1-2 capsules (10-20 mg total) by mouth at bedtime.   estradiol (ESTRACE) 0.1 MG/GM vaginal cream Place 1 g vaginally at bedtime.   estradiol (ESTRACE) 1 MG tablet Take 1 mg by mouth daily.   lithium 300 MG tablet Take 2 tab in am, 2 at noon and 2 tab in evening   nitrofurantoin (MACRODANTIN) 50 MG capsule Take 50 mg by mouth daily as needed (UTI).   valACYclovir (VALTREX) 500 MG tablet Take 500 mg by mouth daily as needed (outbreaks).   vitamin B-12 (CYANOCOBALAMIN) 1000 MCG tablet Take 1,000 mcg by mouth daily.     Allergies:   Patient has no known allergies.   Social History   Socioeconomic History   Marital status: Married    Spouse name: Not on file   Number of children: Not on file   Years of education: Not on file   Highest education level: Not on file  Occupational History   Occupation: Disability    Comment: since 2010  Tobacco Use   Smoking status: Former    Packs/day: 1.00    Years: 30.00    Total pack years: 30.00    Types: Cigarettes    Quit date: 11/04/2012    Years since quitting: 8.6   Smokeless tobacco: Never  Vaping Use   Vaping Use: Never used  Substance  and Sexual Activity   Alcohol use: No    Alcohol/week: 0.0 standard drinks of alcohol   Drug use: No   Sexual activity: Yes    Partners: Male    Birth control/protection: None  Other Topics Concern   Not on file  Social History Narrative   Kiarra was born and grew up in [place], [place]. Her parents divorced when she was 28 years of age, and she grew up with her paternal grandmother and grandfather. Her grandfather was an alcoholic, and Malayjah found him dead of an MI at the age of 22. Her mother used to lock her in a dark bathroom for an hour or so very frequently, and Cecille continues to have phobias associated with opening a bathroom door. Lillyanna graduated from [employer], but has had no real career because she was unable to hold a  job. She has been married for 79 years and has 3 boys, twins age 5 and her oldest son age 75. 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 She is currently attending Alcoholics Anonymous meetings 3 times a week, has a sponsor, and is working the 12 steps.   Social Determinants of Health   Financial Resource Strain: Not on file  Food Insecurity: Not on file  Transportation Needs: Not on file  Physical Activity: Not on file  Stress: Not on file  Social Connections: Not on file     Family History: The patient's family history includes Alcohol abuse in her paternal grandfather; Breast cancer in her paternal grandmother; Coronary artery disease in her father; Hypertension in her father and mother; Kidney disease in her father. There is no history of Colon cancer. Father had MI in his 1s, Mother had two valve surgeries in her 48s (unclear etiology).  She is an only child.    ROS:   Please see the history of present illness.    All other systems reviewed and are negative.  EKGs/Labs/Other Studies Reviewed:    The following studies were reviewed today:  EKG:  EKG is  ordered today.  The ekg ordered today demonstrates   07/03/21: SR rate 84  Recent Labs: 11/04/2020: ALT 14; BUN 10; Creatinine, Ser 0.56; Hemoglobin 14.2; Platelets 234; Potassium 4.3; Sodium 143  Recent Lipid Panel No results found for: "CHOL", "TRIG", "HDL", "CHOLHDL", "VLDL", "LDLCALC", "LDLDIRECT"      Physical Exam:    VS:  BP 104/64   Pulse 84   Ht '5\' 3"'$  (1.6 m)   Wt 123 lb 12.8 oz (56.2 kg)   SpO2 97%   BMI 21.93 kg/m     Wt Readings from Last 3 Encounters:  07/03/21 123 lb 12.8 oz (56.2 kg)  07/01/21 126 lb (57.2 kg)  03/12/21 125 lb (56.7 kg)     GEN:  Well nourished, well developed in no acute distress HEENT: Normal NECK: No JVD; No carotid bruits LYMPHATICS: No lymphadenopathy CARDIAC: RRR, no murmurs, rubs, gallops RESPIRATORY:  Clear to auscultation without rales, wheezing or rhonchi  ABDOMEN: Soft, non-tender, non-distended MUSCULOSKELETAL:  No edema; No deformity  SKIN: Warm and dry NEUROLOGIC:  Alert and oriented x 3 PSYCHIATRIC:  Normal affect   ASSESSMENT:    1. Chest pain, unspecified type   2. DOE (dyspnea on exertion)   3. Family history of mitral valve disorder    PLAN:    Precordial Pain DOE Family history of valve disease and early CAD Distant cocaine use - The patient presents with possible cardiac pain - Would recommend CCTA with possible FFR as needed to exclude obstructive CAD and to assess for non-obstructive CAD requiring secondary prevention - will need ivabradine likely related to her low BP and elevated HR -- will get echo  - this may be related to anxiety, dysphagia and deconditioning but with cardiac risk factors will rule out cardiac etiology  Fall/winter f/u       Medication Adjustments/Labs and Tests Ordered: Current medicines are reviewed at length with the patient today.  Concerns regarding medicines are outlined above.  Orders Placed This Encounter  Procedures   EKG 12-Lead   No orders of the defined types were placed in this encounter.   Patient Instructions   Medication Instructions:  Your physician recommends that you continue on your current medications as directed. Please refer to the Current Medication list given to  you today.  Take Ivabradine 15 mg 2 hours before cardiac ct  Labwork: BMET today  Testing/Procedures: Your physician has requested that you have cardiac CT. Cardiac computed tomography (CT) is a painless test that uses an x-ray machine to take clear, detailed pictures of your heart. For further information please visit [email or web address]. Please follow instruction sheet as given.     Your physician has requested that you have an echocardiogram. Echocardiography is a painless test that uses sound waves to create images of your heart. It provides your doctor with information about the size and shape of your heart and how well your heart's chambers and valves are working. This procedure takes approximately one hour. There are no restrictions for this procedure.   Follow-Up:3-5 months   Any Other Special Instructions Will Be Listed Below (If Applicable).  If you need a refill on your cardiac medications before your next appointment, please call your pharmacy        Your cardiac CT will be scheduled at one of the below locations:   [facility] [address] [place], [place] [address] [phone]  [facility] [address] [place], [place] [address] [phone]  If scheduled at [facility], please arrive at the [facility] and Children's Entrance (Entrance C2) of [facility] 30 minutes prior to test start time. You can use the FREE valet parking offered at entrance C (encouraged to control the heart rate for the test)  Proceed to the [facility] Department (first floor) to check-in and test prep.  All radiology patients and guests should use entrance C2 at [facility], accessed from [facility], even though  the hospital's physical address listed is [address]    If scheduled at [facility], please arrive 15 mins early for check-in and test prep.  Please follow these instructions carefully (unless otherwise directed):  BMET lab work today 07/03/21  On the Night Before the Test: Be sure to Drink plenty of water. Do not consume any caffeinated/decaffeinated beverages or chocolate 12 hours prior to your test. Do not take any antihistamines 12 hours prior to your test. On the Day of the Test: Drink plenty of water until 1 hour prior to the test. Do not eat any food 4 hours prior to the test. You may take your regular medications prior to the test.  Take Ivabradine 15 mg  two hours prior to test.  FEMALES- please wear underwire-free bra if available, avoid dresses & tight clothing   *     After the Test: Drink plenty of water. After receiving IV contrast, you may experience a mild flushed feeling. This is normal. On occasion, you may experience a mild rash up to 24 hours after the test. This is not dangerous. If this occurs, you can take Benadryl 25 mg and increase your fluid intake. If you experience trouble breathing, this can be serious. If it is severe call 911 IMMEDIATELY. If it is mild, please call our office. If you take any of these medications: Glipizide/Metformin, Avandament, Glucavance, please do not take 48 hours after completing test unless otherwise instructed.  We will call to schedule your test 2-4 weeks out understanding that some insurance companies will need an authorization prior to the service being performed.   For non-scheduling related questions, please contact the cardiac imaging nurse navigator should you have any questions/concerns: Marchia Bond, Cardiac Imaging Nurse Navigator Gordy Clement,  Cardiac Imaging Nurse [place] [facility] Direct Office Dial: [phone]   For scheduling needs,  including cancellations and rescheduling, please call [place], [phone].    Signed, Werner Lean, MD  07/03/2021 1:57 PM    [facility]

## 2021-07-07 ENCOUNTER — Other Ambulatory Visit (HOSPITAL_COMMUNITY): Payer: Self-pay | Admitting: Psychiatry

## 2021-07-07 DIAGNOSIS — F250 Schizoaffective disorder, bipolar type: Secondary | ICD-10-CM

## 2021-07-07 DIAGNOSIS — F410 Panic disorder [episodic paroxysmal anxiety] without agoraphobia: Secondary | ICD-10-CM

## 2021-07-11 ENCOUNTER — Other Ambulatory Visit (HOSPITAL_COMMUNITY): Payer: Self-pay

## 2021-07-15 ENCOUNTER — Other Ambulatory Visit (HOSPITAL_COMMUNITY): Payer: Self-pay

## 2021-07-17 ENCOUNTER — Ambulatory Visit (HOSPITAL_COMMUNITY)
Admission: RE | Admit: 2021-07-17 | Discharge: 2021-07-17 | Disposition: A | Discharge status: Home / Self Care | Payer: 59 | Source: Ambulatory Visit | Attending: Internal Medicine | Admitting: Internal Medicine

## 2021-07-17 DIAGNOSIS — R0602 Shortness of breath: Secondary | ICD-10-CM | POA: Diagnosis present

## 2021-07-17 LAB — ECHOCARDIOGRAM COMPLETE
Area-P 1/2: 2.54 cm2
S' Lateral: 2.8 cm

## 2021-07-17 NOTE — Progress Notes (Signed)
*  PRELIMINARY RESULTS* Echocardiogram 2D Echocardiogram has been performed.  Ruth Duncan 07/17/2021, 9:02 AM

## 2021-07-18 LAB — CBC WITH DIFFERENTIAL/PLATELET
Absolute Monocytes: 487 cells/uL (ref 200–950)
Basophils Absolute: 50 cells/uL (ref 0–200)
Basophils Relative: 0.9 %
Eosinophils Absolute: 269 cells/uL (ref 15–500)
Eosinophils Relative: 4.8 %
HCT: 40.5 % (ref 35.0–45.0)
Hemoglobin: 13.4 g/dL (ref 11.7–15.5)
Lymphs Abs: 1585 cells/uL (ref 850–3900)
MCH: 30.2 pg (ref 27.0–33.0)
MCHC: 33.1 g/dL (ref 32.0–36.0)
MCV: 91.2 fL (ref 80.0–100.0)
MPV: 10.3 fL (ref 7.5–12.5)
Monocytes Relative: 8.7 %
Neutro Abs: 3209 cells/uL (ref 1500–7800)
Neutrophils Relative %: 57.3 %
Platelets: 242 10*3/uL (ref 140–400)
RBC: 4.44 10*6/uL (ref 3.80–5.10)
RDW: 12.3 % (ref 11.0–15.0)
Total Lymphocyte: 28.3 %
WBC: 5.6 10*3/uL (ref 3.8–10.8)

## 2021-07-18 LAB — BASIC METABOLIC PANEL
BUN: 17 mg/dL (ref 7–25)
CO2: 30 mmol/L (ref 20–32)
Calcium: 9.4 mg/dL (ref 8.6–10.4)
Chloride: 102 mmol/L (ref 98–110)
Creat: 0.65 mg/dL (ref 0.50–1.03)
Glucose, Bld: 89 mg/dL (ref 65–99)
Potassium: 4.4 mmol/L (ref 3.5–5.3)
Sodium: 139 mmol/L (ref 135–146)

## 2021-07-18 LAB — TISSUE TRANSGLUTAMINASE, IGA: (tTG) Ab, IgA: <1 U/mL

## 2021-07-18 LAB — IGA: Immunoglobulin A: 291 mg/dL (ref 47–310)

## 2021-07-18 LAB — SEDIMENTATION RATE: Sed Rate: 6 mm/h (ref 0–30)

## 2021-07-18 LAB — TSH: TSH: 1.52 mIU/L

## 2021-07-18 LAB — C-REACTIVE PROTEIN: CRP: 0.9 mg/L (ref <8.0)

## 2021-07-21 ENCOUNTER — Other Ambulatory Visit (HOSPITAL_COMMUNITY): Payer: Self-pay | Admitting: Psychiatry

## 2021-07-21 DIAGNOSIS — F250 Schizoaffective disorder, bipolar type: Secondary | ICD-10-CM

## 2021-07-22 ENCOUNTER — Ambulatory Visit: Payer: 59 | Admitting: Gastroenterology

## 2021-07-23 ENCOUNTER — Telehealth (HOSPITAL_BASED_OUTPATIENT_CLINIC_OR_DEPARTMENT_OTHER): Payer: 59 | Admitting: Psychiatry

## 2021-07-23 ENCOUNTER — Encounter (HOSPITAL_COMMUNITY): Payer: Self-pay | Admitting: Psychiatry

## 2021-07-23 DIAGNOSIS — F410 Panic disorder [episodic paroxysmal anxiety] without agoraphobia: Secondary | ICD-10-CM | POA: Diagnosis not present

## 2021-07-23 DIAGNOSIS — F900 Attention-deficit hyperactivity disorder, predominantly inattentive type: Secondary | ICD-10-CM | POA: Diagnosis not present

## 2021-07-23 DIAGNOSIS — F250 Schizoaffective disorder, bipolar type: Secondary | ICD-10-CM

## 2021-07-23 MED ORDER — LITHIUM CARBONATE 300 MG PO TABS: ORAL_TABLET | ORAL | 1 refills | Status: DC

## 2021-07-23 MED ORDER — BUPROPION HCL ER (XL) 150 MG PO TB24: 150.0000 mg | ORAL_TABLET | ORAL | 1 refills | Status: DC

## 2021-07-23 MED ORDER — DOXEPIN HCL 10 MG PO CAPS: 10.0000 mg | ORAL_CAPSULE | Freq: Every day | ORAL | 1 refills | Status: DC

## 2021-07-23 MED ORDER — ATOMOXETINE HCL 25 MG PO CAPS: ORAL_CAPSULE | ORAL | 2 refills | Status: DC

## 2021-07-23 NOTE — Progress Notes (Signed)
Virtual Visit via Telephone Note  I connected with Ruth Duncan on 07/23/21 at  4:00 PM EDT by telephone and verified that I am speaking with the correct person using two identifiers.  Location: Patient: Home Provider: Office   I discussed the limitations, risks, security and privacy concerns of performing an evaluation and management service by telephone and the availability of in person appointments. I also discussed with the patient that there may be a patient responsible charge related to this service. The patient expressed understanding and agreed to proceed.   History of Present Illness: Patient is evaluated by phone session.  She is doing better since we started doxepin.  She is sleeping better.  She is relieved that her son is now moving into her own place.  She is trying to help him and he will continue to see his 2 kids on the weekends.  Patient told since his wife left him and moved to [place] it has been a lot of stress but slowly and gradually things are getting better.  She also had a very good cruise trip with a friend.  She like to go back again.  She is taking all her medication as prescribed.  She has taken few times Xanax because of the family situation.  She forgot to do the lithium level but promised to do it soon.  Recently had blood work and her BUN is 17 creatinine 0.55.  Her glucoses 89.  She had a visit to cardiology but they did not do a lithium level.  She denies any tremor or shakes or any EPS.  Her hallucinations are much better and she is able to focus and attention with the help of Strattera.  Patient has lawyer to get the custody battle for the grandkids.  Her other stressor is her ex-husband who continues to drink but patient does not want to do anything until current issues with their grandkids custody settled.  Her appetite is okay.  Her weight is stable.    Past Psychiatric History:  H/O alcohol, cocaine and inpatient at [facility] in 2007 and rehabilitation at  [facility] however things are now slowly and gradually tried Strattera, Prozac, Trilafon, Abilify, Lamictal, Latuda and Trazadone. No h/o suicidal attempt.  H/O trouble with the law later probation. H/O paranoia, hallucination, anxiety.  Psychiatric Specialty Exam: Physical Exam  Review of Systems  Weight 123 lb (55.8 kg).There is no height or weight on file to calculate BMI.  General Appearance: NA  Eye Contact:  NA  Speech:  Clear and Coherent and Normal Rate  Volume:  Normal  Mood:  Anxious  Affect:  NA  Thought Process:  Goal Directed  Orientation:  Full (Time, Place, and Person)  Thought Content:  Rumination  Suicidal Thoughts:  No  Homicidal Thoughts:  No  Memory:  Immediate;   Good Recent;   Good Remote;   Good  Judgement:  Intact  Insight:  Present  Psychomotor Activity:  NA  Concentration:  Concentration: Good and Attention Span: Good  Recall:  Good  Fund of Knowledge:  Good  Language:  Good  Akathisia:  No  Handed:  Right  AIMS (if indicated):     Assets:  Communication Skills Desire for Improvement Housing Social Support Transportation  ADL's:  Intact  Cognition:  WNL  Sleep:   better      Assessment and Plan: Schizoaffective disorder, bipolar type.  ADD, inattentive type.  Panic attacks.  I reviewed blood work results.  Her BUN/creatinine is  good.  They did not have lithium level but patient promised to have it done soon.  Patient does not want to change the medication since it is working well.  She really liked the new medicine that helps her depression and sleep.  Continue doxepin up to 20 mg at bedtime, lithium 600 mg 3 times a day, Wellbutrin XL 150 mg daily, Strattera 25 mg daily and Xanax 0.25 mg to take as needed for severe anxiety and panic attack.  Patient does not need a new refill at this time but promised to call us back if needed in the future.  Recommended to call us back if he has any question or any concern.  Follow-up in 3 months.  Reinforce  lithium level.  Follow Up Instructions:    I discussed the assessment and treatment plan with the patient. The patient was provided an opportunity to ask questions and all were answered. The patient agreed with the plan and demonstrated an understanding of the instructions.  Collaboration of Care: Other provider involved in patient's care AEB notes are available in epic to review.  Patient/Guardian was advised Release of Information must be obtained prior to any record release in order to collaborate their care with an outside provider. Patient/Guardian was advised if they have not already done so to contact the registration department to sign all necessary forms in order for [place] to release information regarding their care.   Consent: Patient/Guardian gives verbal consent for treatment and assignment of benefits for services provided during this visit. Patient/Guardian expressed understanding and agreed to proceed.     The patient was advised to call back or seek an in-person evaluation if the symptoms worsen or if the condition fails to improve as anticipated.  I provided 25 minutes of non-face-to-face time during this encounter.   Kathlee Nations, MD

## 2021-07-30 ENCOUNTER — Telehealth (HOSPITAL_COMMUNITY): Payer: Self-pay | Admitting: *Deleted

## 2021-07-30 NOTE — Telephone Encounter (Signed)
Reaching out to patient to offer assistance regarding upcoming cardiac imaging study; pt verbalizes understanding of appt date/time, parking situation and where to check in, pre-test NPO status and medications ordered, and verified current allergies; name and call back number provided for further questions should they arise  Gordy Clement RN Navigator Cardiac Imaging [facility] [phone] office [phone] cell  Patient to take '15mg'$  ivabradine two hours prior to her cardiac CT scan.  She is aware to arrive at 10:30am.

## 2021-07-31 ENCOUNTER — Ambulatory Visit (HOSPITAL_COMMUNITY)
Admission: RE | Admit: 2021-07-31 | Discharge: 2021-07-31 | Disposition: A | Discharge status: Home / Self Care | Payer: 59 | Source: Ambulatory Visit | Attending: Internal Medicine | Admitting: Internal Medicine

## 2021-07-31 DIAGNOSIS — I2510 Atherosclerotic heart disease of native coronary artery without angina pectoris: Secondary | ICD-10-CM | POA: Insufficient documentation

## 2021-07-31 DIAGNOSIS — R079 Chest pain, unspecified: Secondary | ICD-10-CM | POA: Insufficient documentation

## 2021-07-31 MED ORDER — IOHEXOL 350 MG/ML SOLN
100.0000 mL | Freq: Once | INTRAVENOUS | Status: AC | PRN
  Administered 2021-07-31: 100 mL via INTRAVENOUS

## 2021-07-31 MED ORDER — NITROGLYCERIN 0.4 MG SL SUBL
SUBLINGUAL_TABLET | SUBLINGUAL | Status: AC
  Filled 2021-07-31: qty 2

## 2021-07-31 MED ORDER — NITROGLYCERIN 0.4 MG SL SUBL
0.8000 mg | SUBLINGUAL_TABLET | Freq: Once | SUBLINGUAL | Status: AC
  Administered 2021-07-31: 0.8 mg via SUBLINGUAL

## 2021-08-18 ENCOUNTER — Encounter: Payer: Self-pay | Admitting: *Deleted

## 2021-09-28 ENCOUNTER — Other Ambulatory Visit: Payer: Self-pay | Admitting: Gastroenterology

## 2021-09-28 DIAGNOSIS — R195 Other fecal abnormalities: Secondary | ICD-10-CM

## 2021-09-30 IMAGING — DX DG CHEST 1V PORT
1 series · 1 of 1 positions shown · non-contrast
Comparison: 05/01/2018 chest radiograph and prior. 05/01/2018 CTA
chest.

CLINICAL DATA: Chest pressure

EXAM:
PORTABLE CHEST 1 VIEW

[chest ap]
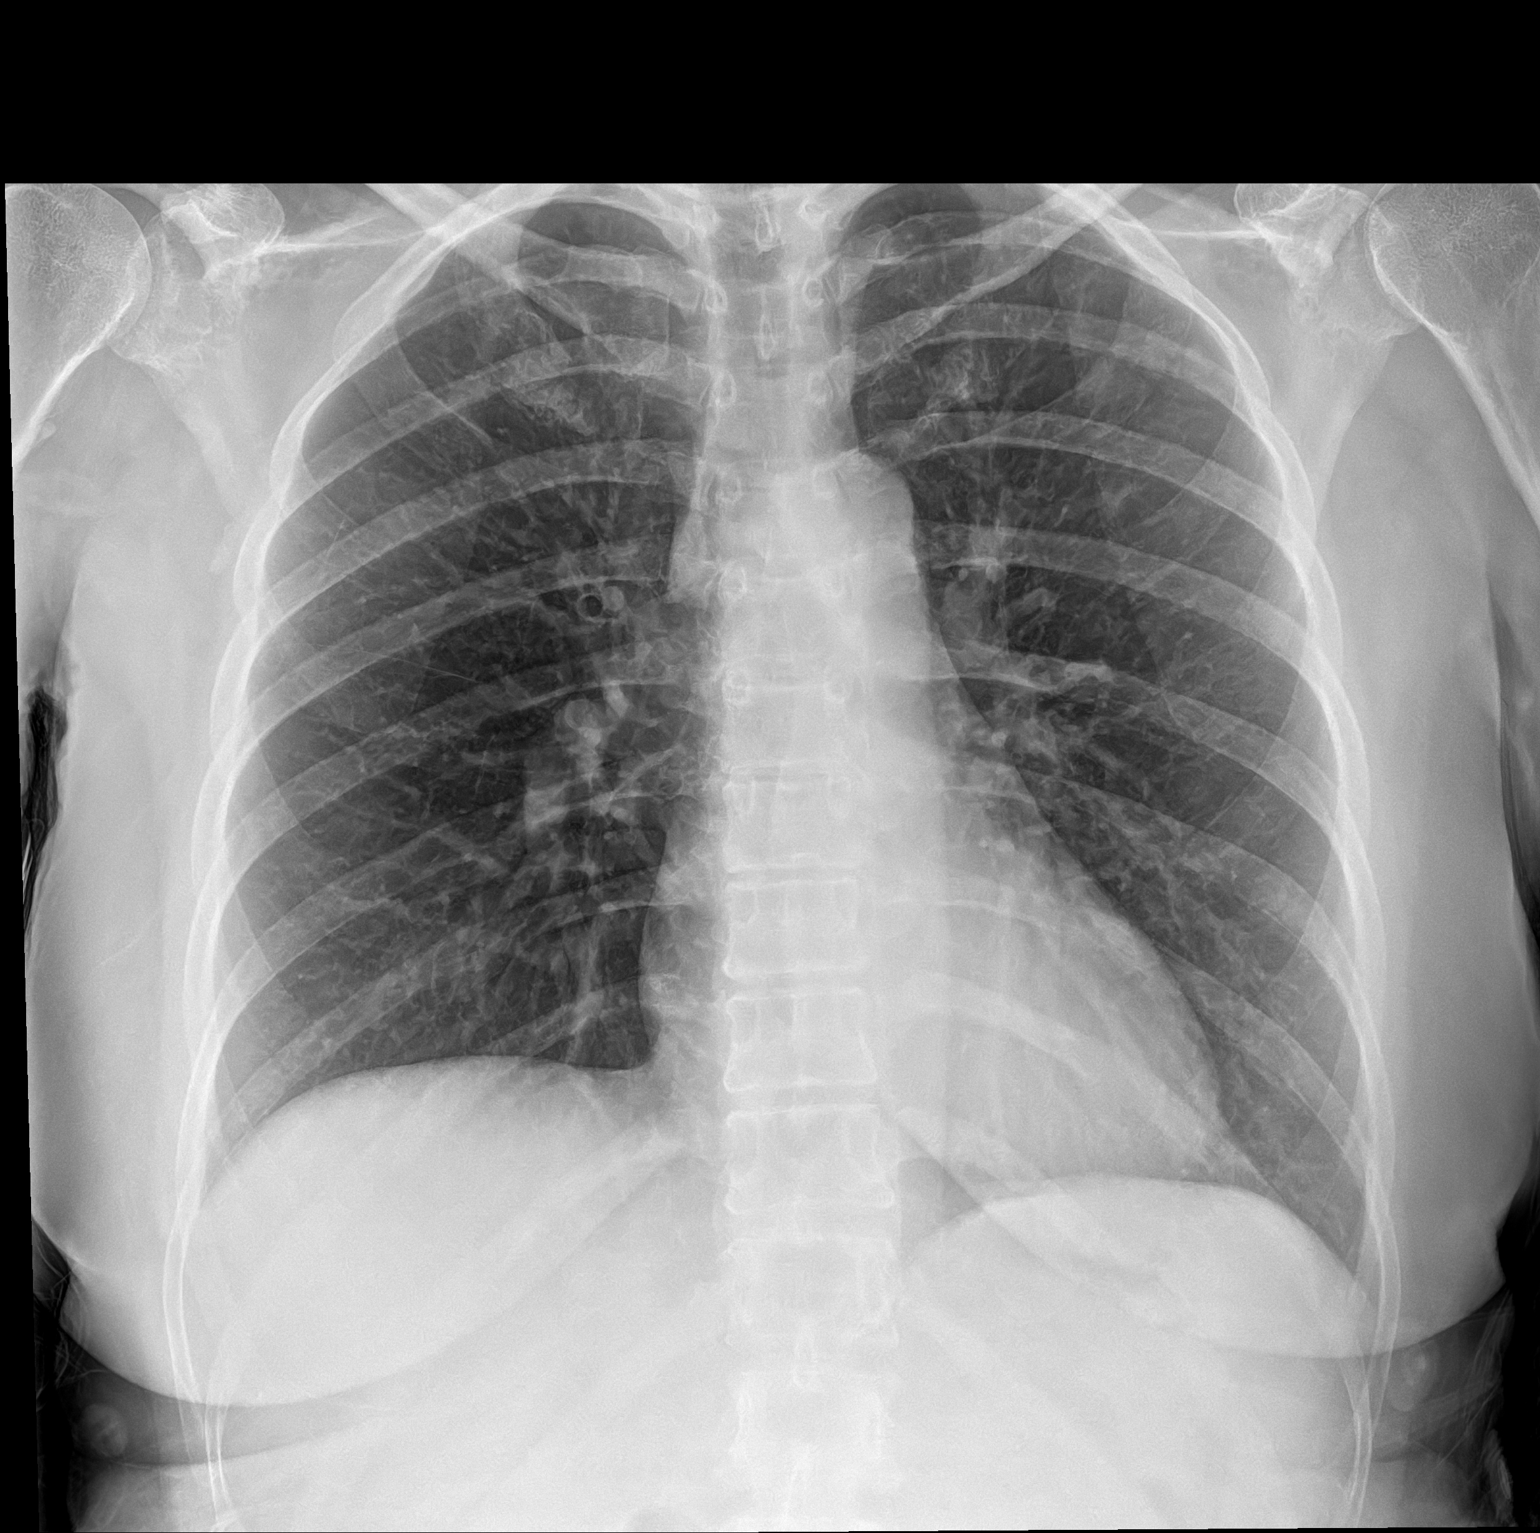

[1 of 1 positions shown; findings below may reference images not displayed]

FINDINGS: The heart size and mediastinal contours are within normal limits.
Both lungs are clear. No pneumothorax or pleural effusion. No acute
osseous abnormality.
IMPRESSION: No focal airspace disease.
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
## 2021-10-07 ENCOUNTER — Encounter: Payer: Self-pay | Admitting: Internal Medicine

## 2021-10-07 ENCOUNTER — Ambulatory Visit: Payer: 59 | Attending: Internal Medicine | Admitting: Internal Medicine

## 2021-10-07 VITALS — BP 102/68 | HR 67 | Ht 63.0 in | Wt 124.2 lb

## 2021-10-07 DIAGNOSIS — I25110 Atherosclerotic heart disease of native coronary artery with unstable angina pectoris: Secondary | ICD-10-CM

## 2021-10-07 DIAGNOSIS — R072 Precordial pain: Secondary | ICD-10-CM | POA: Diagnosis not present

## 2021-10-07 DIAGNOSIS — R0609 Other forms of dyspnea: Secondary | ICD-10-CM | POA: Diagnosis not present

## 2021-10-07 LAB — LIPID PANEL
Chol/HDL Ratio: 3.3 ratio (ref 0.0–4.4)
Cholesterol, Total: 190 mg/dL (ref 100–199)
HDL: 58 mg/dL (ref >39)
LDL Chol Calc (NIH): 110 mg/dL — ABNORMAL HIGH (ref 0–99)
Triglycerides: 126 mg/dL (ref 0–149)
VLDL Cholesterol Cal: 22 mg/dL (ref 5–40)

## 2021-10-07 MED ORDER — NITROGLYCERIN 0.4 MG SL SUBL: 0.4000 mg | SUBLINGUAL_TABLET | SUBLINGUAL | 3 refills | Status: DC | PRN

## 2021-10-07 NOTE — Patient Instructions (Signed)
Medication Instructions:  Your physician has recommended you make the following change in your medication:  START: Nitroglycerin 0.4 mg under your tongue as needed for chest pain  If you have chest pain place 1 tablet under your tongue, wait 5 min If chest pain continues place a 2nd tablet under your tongue, wait 5 min If chest pain continues place a 3rd tablet under your tongue, wait 5 min If chest pain continues call 911  *If you need a refill on your cardiac medications before your next appointment, please call your pharmacy*   Lab Work: TODAY: FLP If you have labs (blood work) drawn today and your tests are completely normal, you will receive your results only by: [place] (if you have MyChart) OR A paper copy in the mail If you have any lab test that is abnormal or we need to change your treatment, we will call you to review the results.   Testing/Procedures: Your physician has requested that you have a Cardiac PET Scan.  Your physician has recommended that you have a pulmonary function test. Pulmonary Function Tests are a group of tests that measure how well air moves in and out of your lungs.  Patient Instructions for Pulmonary Function Test  Do not smoke within at least 1 hour before the test. Do not consume Caffeine 4 hours prior to the test. Do not consume Alcohol within 4 hours prior to testing. Do not perform any Vigorous Exercise within 30 minutes before the test. Do not wear clothes that restrict the chest area or abdomen. Do not use albuterol or Xopenex 3 hours before the test or any other nebulizer medications or inhalers.    Follow-Up: At [facility], you and your health needs are our priority.  As part of our continuing mission to provide you with exceptional heart care, we have created designated Provider Care Teams.  These Care Teams include your primary Cardiologist (physician) and Advanced Practice Providers (APPs -  Physician Assistants and  Nurse Practitioners) who all work together to provide you with the care you need, when you need it.   Your next appointment:   6 month(s)  The format for your next appointment:   In Person  Provider:   Werner Lean, MD     Other Instructions How to Prepare for Your Cardiac PET/CT Stress Test:  1. Please do not take these medications before your test:   Medications that may interfere with the cardiac pharmacological stress agent (ex. nitrates - including erectile dysfunction medications or beta-blockers) the day of the exam. (Erectile dysfunction medication should be held for at least 72 hrs prior to test) Theophylline containing medications for 12 hours. Dipyridamole 48 hours prior to the test. Your remaining medications may be taken with water.  2. Nothing to eat or drink, except water, 3 hours prior to arrival time.   NO caffeine/decaffeinated products, or chocolate 12 hours prior to arrival.  3. NO perfume, cologne or lotion  4. Total time is 1 to 2 hours; you may want to bring reading material for the waiting time.  5. Please report to Admitting at the [facility] Main Entrance 60 minutes early for your test.  [place], [place] [address]  Diabetic Preparation:  Hold oral medications. You may take NPH and Lantus insulin. Do not take Humalog or Humulin R (Regular Insulin) the day of your test. Check blood sugars prior to leaving the house. If able to eat breakfast prior to  3 hour fasting, you may take all medications, including your insulin, Do not worry if you miss your breakfast dose of insulin - start at your next meal.  IF YOU THINK YOU MAY BE PREGNANT, OR ARE NURSING PLEASE INFORM THE TECHNOLOGIST.  In preparation for your appointment, medication and supplies will be purchased.  Appointment availability is limited, so if you need to cancel or reschedule, please call the Radiology Department at [phone]  24 hours in advance to  avoid a cancellation fee of $100.00  What to Expect After you Arrive:  Once you arrive and check in for your appointment, you will be taken to a preparation room within the Radiology Department.  A technologist or Nurse will obtain your medical history, verify that you are correctly prepped for the exam, and explain the procedure.  Afterwards,  an IV will be started in your arm and electrodes will be placed on your skin for EKG monitoring during the stress portion of the exam. Then you will be escorted to the PET/CT scanner.  There, staff will get you positioned on the scanner and obtain a blood pressure and EKG.  During the exam, you will continue to be connected to the EKG and blood pressure machines.  A small, safe amount of a radioactive tracer will be injected in your IV to obtain a series of pictures of your heart along with an injection of a stress agent.    After your Exam:  It is recommended that you eat a meal and drink a caffeinated beverage to counter act any effects of the stress agent.  Drink plenty of fluids for the remainder of the day and urinate frequently for the first couple of hours after the exam.  Your doctor will inform you of your test results within 7-10 business days.  For questions about your test or how to prepare for your test, please call: Marchia Bond, Cardiac Imaging Nurse Navigator  Gordy Clement, Cardiac Imaging Nurse Navigator Office: [phone]   Important Information About Sugar

## 2021-10-08 ENCOUNTER — Telehealth: Payer: Self-pay

## 2021-10-08 DIAGNOSIS — I25110 Atherosclerotic heart disease of native coronary artery with unstable angina pectoris: Secondary | ICD-10-CM

## 2021-10-08 MED ORDER — ROSUVASTATIN CALCIUM 5 MG PO TABS: 5.0000 mg | ORAL_TABLET | Freq: Every day | ORAL | 3 refills | Status: DC

## 2021-10-08 NOTE — Telephone Encounter (Signed)
The patient has been notified of the result and verbalized understanding.  All questions (if any) were answered. [place], RN 10/08/2021 9:57 AM  Pt will have f/u FLP, ALT at [facility] on 01/12/21.

## 2021-10-08 NOTE — Telephone Encounter (Signed)
-----   Message from Werner Lean, MD sent at 10/08/2021  7:56 AM EDT ----- Results/PLan: LDL above goal; Rosuvastatin 5 mg and labs in three months ([place])   Werner Lean, MD

## 2021-10-15 ENCOUNTER — Other Ambulatory Visit (HOSPITAL_COMMUNITY): Payer: Self-pay | Admitting: Psychiatry

## 2021-10-15 DIAGNOSIS — F410 Panic disorder [episodic paroxysmal anxiety] without agoraphobia: Secondary | ICD-10-CM

## 2021-10-15 DIAGNOSIS — F250 Schizoaffective disorder, bipolar type: Secondary | ICD-10-CM

## 2021-10-17 ENCOUNTER — Other Ambulatory Visit (HOSPITAL_COMMUNITY): Payer: Self-pay | Admitting: Psychiatry

## 2021-10-17 DIAGNOSIS — F250 Schizoaffective disorder, bipolar type: Secondary | ICD-10-CM

## 2021-10-21 ENCOUNTER — Telehealth (HOSPITAL_BASED_OUTPATIENT_CLINIC_OR_DEPARTMENT_OTHER): Payer: 59 | Admitting: Psychiatry

## 2021-10-21 ENCOUNTER — Encounter (HOSPITAL_COMMUNITY): Payer: Self-pay | Admitting: Psychiatry

## 2021-10-21 DIAGNOSIS — F900 Attention-deficit hyperactivity disorder, predominantly inattentive type: Secondary | ICD-10-CM

## 2021-10-21 DIAGNOSIS — F250 Schizoaffective disorder, bipolar type: Secondary | ICD-10-CM | POA: Diagnosis not present

## 2021-10-21 DIAGNOSIS — F410 Panic disorder [episodic paroxysmal anxiety] without agoraphobia: Secondary | ICD-10-CM

## 2021-10-21 MED ORDER — DOXEPIN HCL 10 MG PO CAPS: 10.0000 mg | ORAL_CAPSULE | Freq: Every day | ORAL | 1 refills | Status: DC

## 2021-10-21 MED ORDER — ATOMOXETINE HCL 25 MG PO CAPS: ORAL_CAPSULE | ORAL | 2 refills | Status: DC

## 2021-10-21 MED ORDER — LITHIUM CARBONATE 300 MG PO TABS: ORAL_TABLET | ORAL | 1 refills | Status: DC

## 2021-10-21 MED ORDER — BUPROPION HCL ER (XL) 150 MG PO TB24: 150.0000 mg | ORAL_TABLET | ORAL | 1 refills | Status: DC

## 2021-10-21 NOTE — Progress Notes (Signed)
Virtual Visit via Telephone Note  I connected with Ruth Duncan on 10/21/21 at  3:40 PM EDT by telephone and verified that I am speaking with the correct person using two identifiers.  Location: Patient: In Car Provider: Home Office   I discussed the limitations, risks, security and privacy concerns of performing an evaluation and management service by telephone and the availability of in person appointments. I also discussed with the patient that there may be a patient responsible charge related to this service. The patient expressed understanding and agreed to proceed.   History of Present Illness: Patient is evaluated by phone session.  She is in the car coming from [employer] store located near [place].  She is doing okay on her current medication.  Recently she had a trip to [place], [place] to see [employer].  Patient told it was a birthday gift to her son and she really enjoyed but also very tired.  She reported mood has been stable.  She denies any crying spells or any feeling of hopelessness or worthlessness.  She denies any paranoia, hallucination or any mood swings.  She forgot lithium level but promised to do it tomorrow.  She has not taken Xanax in a while.  Her family situation is somewhat stable.  She sleeps good.  Her attention, concentration is good with the help of Strattera.  Recently she had a visit with a cardiology because of high LDL.  Her appetite is okay.  Her weight is stable.  She reported her grandkids custody is continued to have an issue but things are manageable at this time.  She denies drinking or using any illegal substances.  Past Psychiatric History:  H/O alcohol, cocaine and inpatient at [facility] in 2007 and rehabilitation at [facility] however things are now slowly and gradually tried Strattera, Prozac, Trilafon, Abilify, Lamictal, Latuda and Trazadone. No h/o suicidal attempt.  H/O trouble with the law later probation. H/O paranoia, hallucination,  anxiety.  Psychiatric Specialty Exam: Physical Exam  Review of Systems  Weight 124 lb (56.2 kg).There is no height or weight on file to calculate BMI.  General Appearance: NA  Eye Contact:  NA  Speech:  Clear and Coherent  Volume:  Normal  Mood:  Euthymic  Affect:  NA  Thought Process:  Goal Directed  Orientation:  Full (Time, Place, and Person)  Thought Content:  WDL  Suicidal Thoughts:  No  Homicidal Thoughts:  No  Memory:  Immediate;   Good Recent;   Good Remote;   Good  Judgement:  Intact  Insight:  Present  Psychomotor Activity:  NA  Concentration:  Concentration: Good and Attention Span: Good  Recall:  Good  Fund of Knowledge:  Good  Language:  Good  Akathisia:  No  Handed:  Right  AIMS (if indicated):     Assets:  Communication Skills Desire for Improvement Housing Social Support  ADL's:  Intact  Cognition:  WNL  Sleep:   ok      Assessment and Plan: Schizoaffective disorder, bipolar type.  ADD, inattentive type.  Panic attacks.  Patient is stable on her current medication.  Reinforce to have lithium level to be done soon.  She does not want to change the medication since things are going okay.  Continue doxepin 20 mg at bedtime, lithium 600 mg 3 times a day, Wellbutrin XL 150 mg daily and Strattera 25 mg daily.  She does not need a new prescription of Xanax since she is not using as much.  Recommend to call us back if she has any question or any concern.  Follow-up in 3 months.  Follow Up Instructions:    I discussed the assessment and treatment plan with the patient. The patient was provided an opportunity to ask questions and all were answered. The patient agreed with the plan and demonstrated an understanding of the instructions.   The patient was advised to call back or seek an in-person evaluation if the symptoms worsen or if the condition fails to improve as anticipated.  Collaboration of Care: Primary Care Provider AEB notes are available in epic to  review.  Patient/Guardian was advised Release of Information must be obtained prior to any record release in order to collaborate their care with an outside provider. Patient/Guardian was advised if they have not already done so to contact the registration department to sign all necessary forms in order for [place] to release information regarding their care.   Consent: Patient/Guardian gives verbal consent for treatment and assignment of benefits for services provided during this visit. Patient/Guardian expressed understanding and agreed to proceed.    I provided 20 minutes of non-face-to-face time during this encounter.   Kathlee Nations, MD

## 2021-10-27 ENCOUNTER — Other Ambulatory Visit: Payer: Self-pay | Admitting: Physician Assistant

## 2021-10-27 DIAGNOSIS — Z1231 Encounter for screening mammogram for malignant neoplasm of breast: Secondary | ICD-10-CM

## 2021-11-05 ENCOUNTER — Telehealth: Payer: Self-pay

## 2021-11-05 NOTE — Telephone Encounter (Signed)
Pt calling stating that she has an appt for a CT scan on 11/24/21 and pt is wanting medication sent to her pharmacy to help her remain calm. Pt would like a call back concerning this matter. Please address

## 2021-11-09 ENCOUNTER — Ambulatory Visit (INDEPENDENT_AMBULATORY_CARE_PROVIDER_SITE_OTHER): Payer: 59 | Admitting: Internal Medicine

## 2021-11-09 ENCOUNTER — Encounter: Payer: Self-pay | Admitting: Internal Medicine

## 2021-11-09 DIAGNOSIS — R0609 Other forms of dyspnea: Secondary | ICD-10-CM

## 2021-11-09 DIAGNOSIS — I25110 Atherosclerotic heart disease of native coronary artery with unstable angina pectoris: Secondary | ICD-10-CM

## 2021-11-09 DIAGNOSIS — R072 Precordial pain: Secondary | ICD-10-CM

## 2021-11-09 LAB — PULMONARY FUNCTION TEST
DL/VA % pred: 105 %
DL/VA: 4.57 ml/min/mmHg/L
DLCO cor % pred: 115 %
DLCO cor: 23.24 ml/min/mmHg
DLCO unc % pred: 115 %
DLCO unc: 23.24 ml/min/mmHg
FEF 25-75 Post: 4.23 L/sec
FEF 25-75 Pre: 3.27 L/sec
FEF2575-%Change-Post: 29 %
FEF2575-%Pred-Post: 161 %
FEF2575-%Pred-Pre: 124 %
FEV1-%Change-Post: 8 %
FEV1-%Pred-Post: 112 %
FEV1-%Pred-Pre: 103 %
FEV1-Post: 2.99 L
FEV1-Pre: 2.75 L
FEV1FVC-%Change-Post: 1 %
FEV1FVC-%Pred-Pre: 104 %
FEV6-%Change-Post: 5 %
FEV6-%Pred-Post: 106 %
FEV6-%Pred-Pre: 100 %
FEV6-Post: 3.51 L
FEV6-Pre: 3.31 L
FEV6FVC-%Change-Post: 0 %
FEV6FVC-%Pred-Post: 102 %
FEV6FVC-%Pred-Pre: 102 %
FVC-%Change-Post: 6 %
FVC-%Pred-Post: 104 %
FVC-%Pred-Pre: 98 %
FVC-Post: 3.53 L
FVC-Pre: 3.31 L
Post FEV1/FVC ratio: 85 %
Post FEV6/FVC ratio: 99 %
Pre FEV1/FVC ratio: 83 %
Pre FEV6/FVC Ratio: 100 %
RV % pred: 97 %
RV: 1.74 L
TLC % pred: 106 %
TLC: 5.21 L

## 2021-11-09 NOTE — Progress Notes (Signed)
Full PFT Performed Today  

## 2021-11-09 NOTE — Patient Instructions (Signed)
Full PFT Performed Today  

## 2021-11-11 NOTE — Telephone Encounter (Signed)
Sent pt a my chart message informing her that MD expresses PET SCAN machine is open and pt will be able to see out.  No need for calming message.

## 2021-11-13 ENCOUNTER — Encounter (HOSPITAL_COMMUNITY): Payer: Self-pay | Admitting: Psychiatry

## 2021-11-19 ENCOUNTER — Other Ambulatory Visit: Payer: Self-pay

## 2021-11-19 DIAGNOSIS — R0609 Other forms of dyspnea: Secondary | ICD-10-CM

## 2021-11-19 DIAGNOSIS — R079 Chest pain, unspecified: Secondary | ICD-10-CM

## 2021-11-19 NOTE — Progress Notes (Signed)
Order pended for attestation pt d/t have Cardiac PET scan on 11/24/21.

## 2021-11-20 ENCOUNTER — Telehealth (HOSPITAL_COMMUNITY): Payer: Self-pay | Admitting: *Deleted

## 2021-11-20 NOTE — Telephone Encounter (Signed)
Reaching out to patient to offer assistance regarding upcoming cardiac imaging study; pt verbalizes understanding of appt date/time, parking situation and where to check in, pre-test NPO status  and verified current allergies; name and call back number provided for further questions should they arise  Jariel Drost RN Navigator Cardiac Imaging [facility] [phone] office [phone] cell  Patient aware to avoid caffeine 12 hours prior to her cardiac PET scan. 

## 2021-11-24 ENCOUNTER — Ambulatory Visit (HOSPITAL_COMMUNITY)
Admission: RE | Admit: 2021-11-24 | Discharge: 2021-11-24 | Disposition: A | Discharge status: Home / Self Care | Payer: 59 | Source: Ambulatory Visit | Attending: Internal Medicine | Admitting: Internal Medicine

## 2021-11-24 DIAGNOSIS — R072 Precordial pain: Secondary | ICD-10-CM | POA: Insufficient documentation

## 2021-11-24 LAB — NM PET CT CARDIAC PERFUSION MULTI W/ABSOLUTE BLOODFLOW
LV dias vol: 64 mL (ref 46–106)
LV sys vol: 20 mL
MBFR: 4.64
Nuc Rest EF: 69 %
Nuc Stress EF: 78 %
Rest MBF: 0.97 ml/g/min
Rest Nuclear Isotope Dose: 14.6 mCi
ST Depression (mm): 0 mm
Stress MBF: 4.5 ml/g/min
Stress Nuclear Isotope Dose: 14.6 mCi

## 2021-11-24 MED ORDER — RUBIDIUM RB82 GENERATOR (RUBYFILL)
14.6000 | PACK | Freq: Once | INTRAVENOUS | Status: AC
  Administered 2021-11-24: 14.6 via INTRAVENOUS

## 2021-11-24 MED ORDER — REGADENOSON 0.4 MG/5ML IV SOLN: 0.4000 mg | Freq: Once | INTRAVENOUS | Status: AC

## 2021-11-24 MED ORDER — REGADENOSON 0.4 MG/5ML IV SOLN
INTRAVENOUS | Status: AC
  Administered 2021-11-24: 0.4 mg via INTRAVENOUS
  Filled 2021-11-24: qty 5

## 2021-11-24 NOTE — Progress Notes (Signed)
NM PET CT with Lexiscan complete and pt tolerated it well. Pt c/o a headache and was educated on the side effects of medications. VS and orders assessed and pt denies additional complaints. Will continue to monitor and tx pt according to MD orders. Coffee offered.

## 2021-12-06 ENCOUNTER — Other Ambulatory Visit (HOSPITAL_COMMUNITY): Payer: Self-pay | Admitting: Psychiatry

## 2021-12-06 DIAGNOSIS — F250 Schizoaffective disorder, bipolar type: Secondary | ICD-10-CM

## 2021-12-14 ENCOUNTER — Other Ambulatory Visit (HOSPITAL_COMMUNITY): Payer: Self-pay | Admitting: Psychiatry

## 2021-12-14 DIAGNOSIS — F250 Schizoaffective disorder, bipolar type: Secondary | ICD-10-CM

## 2021-12-14 DIAGNOSIS — F410 Panic disorder [episodic paroxysmal anxiety] without agoraphobia: Secondary | ICD-10-CM

## 2021-12-17 ENCOUNTER — Ambulatory Visit: Payer: 59

## 2021-12-26 ENCOUNTER — Other Ambulatory Visit: Payer: Self-pay | Admitting: Gastroenterology

## 2021-12-26 DIAGNOSIS — R195 Other fecal abnormalities: Secondary | ICD-10-CM

## 2022-01-13 ENCOUNTER — Other Ambulatory Visit (HOSPITAL_COMMUNITY): Payer: Self-pay | Admitting: Psychiatry

## 2022-01-13 DIAGNOSIS — F250 Schizoaffective disorder, bipolar type: Secondary | ICD-10-CM

## 2022-01-19 ENCOUNTER — Telehealth: Payer: Self-pay

## 2022-01-19 NOTE — Telephone Encounter (Signed)
Returned the pt's call. LMOVM for the pt to call back

## 2022-01-20 ENCOUNTER — Encounter (HOSPITAL_COMMUNITY): Payer: Self-pay | Admitting: Psychiatry

## 2022-01-20 ENCOUNTER — Telehealth (HOSPITAL_COMMUNITY): Payer: Self-pay | Admitting: *Deleted

## 2022-01-20 ENCOUNTER — Telehealth (HOSPITAL_BASED_OUTPATIENT_CLINIC_OR_DEPARTMENT_OTHER): Payer: 59 | Admitting: Psychiatry

## 2022-01-20 ENCOUNTER — Other Ambulatory Visit (HOSPITAL_COMMUNITY): Payer: Self-pay | Admitting: *Deleted

## 2022-01-20 DIAGNOSIS — F250 Schizoaffective disorder, bipolar type: Secondary | ICD-10-CM | POA: Diagnosis not present

## 2022-01-20 DIAGNOSIS — Z5181 Encounter for therapeutic drug level monitoring: Secondary | ICD-10-CM

## 2022-01-20 DIAGNOSIS — F410 Panic disorder [episodic paroxysmal anxiety] without agoraphobia: Secondary | ICD-10-CM | POA: Diagnosis not present

## 2022-01-20 DIAGNOSIS — F900 Attention-deficit hyperactivity disorder, predominantly inattentive type: Secondary | ICD-10-CM

## 2022-01-20 DIAGNOSIS — Z79899 Other long term (current) drug therapy: Secondary | ICD-10-CM

## 2022-01-20 MED ORDER — BUPROPION HCL ER (XL) 150 MG PO TB24: 150.0000 mg | ORAL_TABLET | ORAL | 2 refills | Status: DC

## 2022-01-20 MED ORDER — ATOMOXETINE HCL 25 MG PO CAPS: ORAL_CAPSULE | ORAL | 2 refills | Status: DC

## 2022-01-20 MED ORDER — LITHIUM CARBONATE 300 MG PO TABS: ORAL_TABLET | ORAL | 2 refills | Status: DC

## 2022-01-20 MED ORDER — ALPRAZOLAM 0.25 MG PO TABS: 0.2500 mg | ORAL_TABLET | Freq: Every day | ORAL | 0 refills | Status: AC | PRN

## 2022-01-20 MED ORDER — DOXEPIN HCL 10 MG PO CAPS: 10.0000 mg | ORAL_CAPSULE | Freq: Every day | ORAL | 2 refills | Status: DC

## 2022-01-20 NOTE — Telephone Encounter (Signed)
Writer spoke with pt to advise that a Lithium level and CMP have been ordered by Dr. Adele Schilder. Pt will go to the [facility] on [address]. In [place]. Pt advised to hold a.m. Lithium dose prior to blood draw. Pt verbalizes understanding.

## 2022-01-20 NOTE — Progress Notes (Signed)
Virtual Visit via Telephone Note  I connected with Ruth Duncan on 01/20/22 at  2:40 PM EST by telephone and verified that I am speaking with the correct person using two identifiers.  Location: Patient: In Car Provider: Home Office   I discussed the limitations, risks, security and privacy concerns of performing an evaluation and management service by telephone and the availability of in person appointments. I also discussed with the patient that there may be a patient responsible charge related to this service. The patient expressed understanding and agreed to proceed.   History of Present Illness: Patient is evaluated by phone session.  She reported Christmas was quiet and she did not enjoy as she usually enjoys.  She is not sure what triggered her may be feeling overwhelmed with the grandkids.  She enjoyed the company but sometimes she feels overwhelmed due to responsibilities.  Her 1-year-old and 24-year-old grandkids comes 3 to 4 hours almost every day and stays some time on the weekend.  She also having upcoming hand surgery in 2 weeks.  She reported some time crying spells and sadness but denies any suicidal thoughts, mania, hallucination or any anger.  She had a few panic attacks and usually she takes the Xanax that helps.  She has not given prescription since June because she was using leftover but she is completely out.  Her other concern is the custody battle and child support which is still in the hands of the lawyer and has not settled yet.  She takes the medication as prescribed.  She takes doxepin only 1 capsule at bedtime and that helps most of the time her sleep.  She has no tremors, shakes or any EPS.  She again forgot the lithium level but promised to get it done soon.  Patient occasionally in touch with her husband.  Patient told he remained the same and does not take any responsibility or involved in the grandkids life.  He denies drinking or using any illegal substances.   Past  Psychiatric History:  H/O alcohol, cocaine and inpatient at [facility] in 2007 and rehabilitation at [facility] however things are now slowly and gradually tried Strattera, Prozac, Trilafon, Abilify, Lamictal, Latuda and Trazadone. No h/o suicidal attempt.  H/O trouble with the law later probation. H/O paranoia, hallucination, anxiety.   Psychiatric Specialty Exam: Physical Exam  Review of Systems  Weight 124 lb (56.2 kg).There is no height or weight on file to calculate BMI.  General Appearance: NA  Eye Contact:  NA  Speech:  Normal Rate  Volume:  Normal  Mood:  Anxious, Depressed, and Dysphoric  Affect:  NA  Thought Process:  Goal Directed  Orientation:  Full (Time, Place, and Person)  Thought Content:  Logical  Suicidal Thoughts:  No  Homicidal Thoughts:  No  Memory:  Immediate;   Good Recent;   Good Remote;   Good  Judgement:  Intact  Insight:  Good  Psychomotor Activity:  Normal  Concentration:  Concentration: Good and Attention Span: Good  Recall:  Good  Fund of Knowledge:  Good  Language:  Good  Akathisia:  No  Handed:  Right  AIMS (if indicated):     Assets:  Communication Skills Desire for Improvement Financial Resources/Insurance Housing Transportation  ADL's:  Intact  Cognition:  WNL  Sleep:   ok      Assessment and Plan: Scheduled affect disorder, bipolar type.  Attention deficit disorder, inattentive type.  Panic attacks.  Discussed psychosocial stressors.  Patient pleased that  Christmas is over and trying to get back to her normal life.  She like to get renewal of the Xanax which she takes 0.25 mg as needed for severe panic attack.  Patient does not want to change the medication for now however agreed to give [place] a call back if needed a sooner appointment.  Continue doxepin 10 mg as patient only taking 1 pill at night, lithium 6 mg 3 times a day, Wellbutrin XL 150 mg daily and Strattera 25 mg daily.  Reminded that she needed lithium level.  We will also do CMP.   Recommend to call us back if she has any question or any concern.  Follow-up in 3 months.  Follow Up Instructions:    I discussed the assessment and treatment plan with the patient. The patient was provided an opportunity to ask questions and all were answered. The patient agreed with the plan and demonstrated an understanding of the instructions.   The patient was advised to call back or seek an in-person evaluation if the symptoms worsen or if the condition fails to improve as anticipated.  Collaboration of Care: Other provider involved in patient's care AEB notes are available in epic to review.  Patient/Guardian was advised Release of Information must be obtained prior to any record release in order to collaborate their care with an outside provider. Patient/Guardian was advised if they have not already done so to contact the registration department to sign all necessary forms in order for [place] to release information regarding their care.   Consent: Patient/Guardian gives verbal consent for treatment and assignment of benefits for services provided during this visit. Patient/Guardian expressed understanding and agreed to proceed.    I provided 21 minutes of non-face-to-face time during this encounter.   Kathlee Nations, MD

## 2022-01-22 ENCOUNTER — Ambulatory Visit (HOSPITAL_COMMUNITY)
Admission: RE | Admit: 2022-01-22 | Discharge: 2022-01-22 | Disposition: A | Discharge status: Home / Self Care | Payer: 59 | Source: Ambulatory Visit | Attending: Gastroenterology | Admitting: Gastroenterology

## 2022-01-22 ENCOUNTER — Encounter: Payer: Self-pay | Admitting: Gastroenterology

## 2022-01-22 ENCOUNTER — Ambulatory Visit (INDEPENDENT_AMBULATORY_CARE_PROVIDER_SITE_OTHER): Payer: 59 | Admitting: Gastroenterology

## 2022-01-22 VITALS — BP 117/81 | HR 70 | Temp 98.0°F | Ht 63.0 in | Wt 128.0 lb

## 2022-01-22 DIAGNOSIS — R1032 Left lower quadrant pain: Secondary | ICD-10-CM

## 2022-01-22 DIAGNOSIS — R1031 Right lower quadrant pain: Secondary | ICD-10-CM | POA: Insufficient documentation

## 2022-01-22 DIAGNOSIS — R1030 Lower abdominal pain, unspecified: Secondary | ICD-10-CM

## 2022-01-22 MED ORDER — ONDANSETRON HCL 4 MG PO TABS: 4.0000 mg | ORAL_TABLET | Freq: Four times a day (QID) | ORAL | 0 refills | Status: AC | PRN

## 2022-01-22 MED ORDER — IOHEXOL 9 MG/ML PO SOLN: 500.0000 mL | ORAL | Status: DC

## 2022-01-22 MED ORDER — IOHEXOL 9 MG/ML PO SOLN
500.0000 mL | ORAL | Status: AC
  Administered 2022-01-22: 500 mL via ORAL

## 2022-01-22 MED ORDER — METRONIDAZOLE 500 MG PO TABS: 500.0000 mg | ORAL_TABLET | Freq: Three times a day (TID) | ORAL | 0 refills | Status: AC

## 2022-01-22 MED ORDER — HYDROCODONE-ACETAMINOPHEN 5-300 MG PO TABS: 1.0000 | ORAL_TABLET | Freq: Four times a day (QID) | ORAL | 0 refills | Status: DC | PRN

## 2022-01-22 MED ORDER — IOHEXOL 350 MG/ML SOLN
65.0000 mL | Freq: Once | INTRAVENOUS | Status: AC | PRN
  Administered 2022-01-22: 65 mL via INTRAVENOUS

## 2022-01-22 MED ORDER — CIPROFLOXACIN HCL 500 MG PO TABS: 500.0000 mg | ORAL_TABLET | Freq: Two times a day (BID) | ORAL | 0 refills | Status: AC
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
## 2022-01-22 NOTE — Patient Instructions (Addendum)
Please go for stat labs and CT scan today as discussed. We will be in touch with results.  I have sent in one time RX for Vicodin for acute pain along with Zofran for nausea.  I have sent in Cipro, Flagyl for possible diverticulitis. For significant pain, please to to the ER.

## 2022-01-23 LAB — CMP14+EGFR
ALT: 11 IU/L (ref 0–32)
AST: 13 IU/L (ref 0–40)
Albumin/Globulin Ratio: 1.7 (ref 1.2–2.2)
Albumin: 4.7 g/dL (ref 3.8–4.9)
Alkaline Phosphatase: 82 IU/L (ref 44–121)
BUN/Creatinine Ratio: 16 (ref 9–23)
BUN: 10 mg/dL (ref 6–24)
Bilirubin Total: 0.5 mg/dL (ref 0.0–1.2)
CO2: 25 mmol/L (ref 20–29)
Calcium: 9.6 mg/dL (ref 8.7–10.2)
Chloride: 101 mmol/L (ref 96–106)
Creatinine, Ser: 0.64 mg/dL (ref 0.57–1.00)
Globulin, Total: 2.8 g/dL (ref 1.5–4.5)
Glucose: 84 mg/dL (ref 70–99)
Potassium: 4.2 mmol/L (ref 3.5–5.2)
Sodium: 141 mmol/L (ref 134–144)
Total Protein: 7.5 g/dL (ref 6.0–8.5)
eGFR: 106 mL/min/{1.73_m2} (ref >59)

## 2022-01-23 LAB — CBC WITH DIFFERENTIAL/PLATELET
Basophils Absolute: 0.1 10*3/uL (ref 0.0–0.2)
Basos: 1 %
EOS (ABSOLUTE): 0.4 10*3/uL (ref 0.0–0.4)
Eos: 6 %
Hematocrit: 42 % (ref 34.0–46.6)
Hemoglobin: 14.1 g/dL (ref 11.1–15.9)
Immature Grans (Abs): 0 10*3/uL (ref 0.0–0.1)
Immature Granulocytes: 0 %
Lymphocytes Absolute: 2 10*3/uL (ref 0.7–3.1)
Lymphs: 31 %
MCH: 30.3 pg (ref 26.6–33.0)
MCHC: 33.6 g/dL (ref 31.5–35.7)
MCV: 90 fL (ref 79–97)
Monocytes Absolute: 0.5 10*3/uL (ref 0.1–0.9)
Monocytes: 9 %
Neutrophils Absolute: 3.4 10*3/uL (ref 1.4–7.0)
Neutrophils: 53 %
Platelets: 253 10*3/uL (ref 150–450)
RBC: 4.66 x10E6/uL (ref 3.77–5.28)
RDW: 12.4 % (ref 11.7–15.4)
WBC: 6.3 10*3/uL (ref 3.4–10.8)

## 2022-01-23 LAB — BASIC METABOLIC PANEL
BUN/Creatinine Ratio: 16 (ref 9–23)
BUN: 10 mg/dL (ref 6–24)
CO2: 26 mmol/L (ref 20–29)
Calcium: 9.7 mg/dL (ref 8.7–10.2)
Chloride: 100 mmol/L (ref 96–106)
Creatinine, Ser: 0.62 mg/dL (ref 0.57–1.00)
Glucose: 87 mg/dL (ref 70–99)
Potassium: 4.5 mmol/L (ref 3.5–5.2)
Sodium: 139 mmol/L (ref 134–144)
eGFR: 107 mL/min/{1.73_m2} (ref >59)

## 2022-01-23 LAB — LITHIUM LEVEL: Lithium Lvl: <0.1 mmol/L — ABNORMAL LOW (ref 0.5–1.2)

## 2022-01-25 ENCOUNTER — Ambulatory Visit
Admission: RE | Admit: 2022-01-25 | Discharge: 2022-01-25 | Disposition: A | Discharge status: Home / Self Care | Payer: 59 | Source: Ambulatory Visit | Attending: Physician Assistant | Admitting: Physician Assistant

## 2022-01-25 ENCOUNTER — Telehealth (HOSPITAL_COMMUNITY): Payer: Self-pay | Admitting: *Deleted

## 2022-01-25 ENCOUNTER — Other Ambulatory Visit: Payer: Self-pay | Admitting: Physician Assistant

## 2022-01-25 DIAGNOSIS — Z1231 Encounter for screening mammogram for malignant neoplasm of breast: Secondary | ICD-10-CM

## 2022-01-25 NOTE — Telephone Encounter (Signed)
Received results from [facility] re: Lithium level, which is resulting as low @ <0.1 mmol/L. Verified by repeat analysis. FYI.

## 2022-01-25 NOTE — Telephone Encounter (Signed)
Yes, I reviewed the level.  No need to change the dose.  We will discuss if lithium need to be discontinued on her next appointment since levels are getting less than therapeutic on a regular basis.

## 2022-02-01 ENCOUNTER — Telehealth (INDEPENDENT_AMBULATORY_CARE_PROVIDER_SITE_OTHER): Payer: Self-pay | Admitting: *Deleted

## 2022-02-01 ENCOUNTER — Encounter (INDEPENDENT_AMBULATORY_CARE_PROVIDER_SITE_OTHER): Payer: Self-pay | Admitting: *Deleted

## 2022-02-01 NOTE — Telephone Encounter (Signed)
PA approved via [facility]. Auth# [id number], DOS: Feb 04, 2022 - May 05, 2022

## 2022-02-12 ENCOUNTER — Other Ambulatory Visit (HOSPITAL_COMMUNITY): Payer: Self-pay | Admitting: Psychiatry

## 2022-02-12 DIAGNOSIS — F250 Schizoaffective disorder, bipolar type: Secondary | ICD-10-CM

## 2022-02-12 DIAGNOSIS — F410 Panic disorder [episodic paroxysmal anxiety] without agoraphobia: Secondary | ICD-10-CM

## 2022-02-23 ENCOUNTER — Encounter: Payer: Self-pay | Admitting: *Deleted

## 2022-02-23 ENCOUNTER — Telehealth: Payer: Self-pay | Admitting: *Deleted

## 2022-02-23 NOTE — Telephone Encounter (Signed)
Pt states she has a sinus infection and needs to reschedule. Pt has been rescheduled from 02/26/22 until 03/18/22. New instructions sent to pt.

## 2022-02-23 NOTE — Telephone Encounter (Signed)
Pt had left vm wanting to reschedule her procedure on 02/26/22.  [place]

## 2022-03-01 ENCOUNTER — Telehealth: Payer: Self-pay | Admitting: *Deleted

## 2022-03-01 NOTE — Telephone Encounter (Signed)
Pt called and left vm that she has another doctors appointment on 03/18/22 that she can't miss and needs to reschedule her procedure.  [place]

## 2022-03-01 NOTE — Telephone Encounter (Signed)
Pt called back, states she needs a late afternoon on a Thursday or needs a Friday. No appointments available on those day, she asked for a call back when April schedule comes out. Will call pt once we get providers schedule.

## 2022-03-15 ENCOUNTER — Telehealth (INDEPENDENT_AMBULATORY_CARE_PROVIDER_SITE_OTHER): Payer: Self-pay | Admitting: *Deleted

## 2022-03-15 NOTE — Telephone Encounter (Signed)
Spoke with pt. She has been scheduled for EGD/ED with Dr. [name], ASA 2 on 4/12 at [place] will send instructions via mychart   PA approved via [facility]. Auth# [id number], DOS: Feb 04, 2022 - May 05, 2022

## 2022-03-18 ENCOUNTER — Ambulatory Visit (HOSPITAL_COMMUNITY): Admit: 2022-03-18 | Payer: 59

## 2022-03-18 ENCOUNTER — Encounter (HOSPITAL_COMMUNITY): Payer: Self-pay

## 2022-03-18 SURGERY — ESOPHAGOGASTRODUODENOSCOPY (EGD) WITH PROPOFOL
Anesthesia: Monitor Anesthesia Care

## 2022-03-28 ENCOUNTER — Other Ambulatory Visit: Payer: Self-pay | Admitting: Gastroenterology

## 2022-03-28 DIAGNOSIS — R195 Other fecal abnormalities: Secondary | ICD-10-CM

## 2022-04-03 ENCOUNTER — Other Ambulatory Visit (HOSPITAL_COMMUNITY): Payer: Self-pay | Admitting: Psychiatry

## 2022-04-03 DIAGNOSIS — F900 Attention-deficit hyperactivity disorder, predominantly inattentive type: Secondary | ICD-10-CM

## 2022-04-12 ENCOUNTER — Encounter: Payer: Self-pay | Admitting: Internal Medicine

## 2022-04-12 ENCOUNTER — Ambulatory Visit: Payer: 59 | Attending: Internal Medicine | Admitting: Internal Medicine

## 2022-04-12 VITALS — BP 100/62 | HR 82 | Ht 63.0 in | Wt 129.0 lb

## 2022-04-12 DIAGNOSIS — Z87891 Personal history of nicotine dependence: Secondary | ICD-10-CM | POA: Diagnosis not present

## 2022-04-12 DIAGNOSIS — E782 Mixed hyperlipidemia: Secondary | ICD-10-CM | POA: Diagnosis not present

## 2022-04-12 DIAGNOSIS — I25110 Atherosclerotic heart disease of native coronary artery with unstable angina pectoris: Secondary | ICD-10-CM | POA: Diagnosis not present

## 2022-04-12 NOTE — Patient Instructions (Addendum)
Medication Instructions:  Your physician recommends that you continue on your current medications as directed. Please refer to the Current Medication list given to you today.  *If you need a refill on your cardiac medications before your next appointment, please call your pharmacy*   Lab Work: TODAY: LDL Direct  If you have labs (blood work) drawn today and your tests are completely normal, you will receive your results only by: MyChart Message (if you have MyChart) OR A paper copy in the mail If you have any lab test that is abnormal or we need to change your treatment, we will call you to review the results.   Testing/Procedures Your physician has requested that you have a CT Lungs.   Follow-Up: At [facility], you and your health needs are our priority.  As part of our continuing mission to provide you with exceptional heart care, we have created designated Provider Care Teams.  These Care Teams include your primary Cardiologist (physician) and Advanced Practice Providers (APPs -  Physician Assistants and Nurse Practitioners) who all work together to provide you with the care you need, when you need it.   Your next appointment:   6 month(s)  Provider:   Christell Constant, MD
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
## 2022-04-13 ENCOUNTER — Telehealth: Payer: Self-pay

## 2022-04-13 ENCOUNTER — Other Ambulatory Visit (HOSPITAL_COMMUNITY): Payer: Self-pay | Admitting: Psychiatry

## 2022-04-13 DIAGNOSIS — F410 Panic disorder [episodic paroxysmal anxiety] without agoraphobia: Secondary | ICD-10-CM

## 2022-04-13 DIAGNOSIS — F250 Schizoaffective disorder, bipolar type: Secondary | ICD-10-CM

## 2022-04-13 DIAGNOSIS — I25110 Atherosclerotic heart disease of native coronary artery with unstable angina pectoris: Secondary | ICD-10-CM

## 2022-04-13 DIAGNOSIS — E782 Mixed hyperlipidemia: Secondary | ICD-10-CM

## 2022-04-13 LAB — LDL CHOLESTEROL, DIRECT: LDL Direct: 111 mg/dL — ABNORMAL HIGH (ref 0–99)

## 2022-04-13 MED ORDER — ROSUVASTATIN CALCIUM 10 MG PO TABS: 10.0000 mg | ORAL_TABLET | Freq: Every day | ORAL | 3 refills | Status: DC

## 2022-04-13 NOTE — Telephone Encounter (Signed)
-----   Message from Christell Constant, MD sent at 04/13/2022  8:27 AM EDT ----- Results: LDL still above goal Plan: Increase rosuvastatin to 10 mg and labs in three months  Christell Constant, MD

## 2022-04-13 NOTE — Telephone Encounter (Signed)
The patient has been notified of the result and verbalized understanding.  All questions (if any) were answered. Arvid Right Isel Skufca, RN 04/13/2022 2:23 PM   Pt will come in for FLP and ALT on 07/16/22

## 2022-04-16 ENCOUNTER — Ambulatory Visit (HOSPITAL_BASED_OUTPATIENT_CLINIC_OR_DEPARTMENT_OTHER): Payer: 59 | Admitting: Anesthesiology

## 2022-04-16 ENCOUNTER — Ambulatory Visit (HOSPITAL_COMMUNITY)
Admission: RE | Admit: 2022-04-16 | Discharge: 2022-04-16 | Disposition: A | Discharge status: Home / Self Care | Payer: 59 | Attending: Internal Medicine | Admitting: Internal Medicine

## 2022-04-16 ENCOUNTER — Other Ambulatory Visit: Payer: Self-pay

## 2022-04-16 ENCOUNTER — Encounter (HOSPITAL_COMMUNITY): Payer: Self-pay

## 2022-04-16 ENCOUNTER — Encounter (HOSPITAL_COMMUNITY)
Admission: RE | Disposition: A | Discharge status: Home / Self Care | Payer: Self-pay | Source: Home / Self Care | Attending: Internal Medicine

## 2022-04-16 ENCOUNTER — Ambulatory Visit (HOSPITAL_COMMUNITY): Payer: 59 | Admitting: Anesthesiology

## 2022-04-16 DIAGNOSIS — K317 Polyp of stomach and duodenum: Secondary | ICD-10-CM

## 2022-04-16 DIAGNOSIS — Z87891 Personal history of nicotine dependence: Secondary | ICD-10-CM | POA: Diagnosis not present

## 2022-04-16 DIAGNOSIS — F419 Anxiety disorder, unspecified: Secondary | ICD-10-CM | POA: Diagnosis not present

## 2022-04-16 DIAGNOSIS — K449 Diaphragmatic hernia without obstruction or gangrene: Secondary | ICD-10-CM | POA: Diagnosis not present

## 2022-04-16 DIAGNOSIS — I25119 Atherosclerotic heart disease of native coronary artery with unspecified angina pectoris: Secondary | ICD-10-CM | POA: Insufficient documentation

## 2022-04-16 DIAGNOSIS — R1310 Dysphagia, unspecified: Secondary | ICD-10-CM | POA: Diagnosis present

## 2022-04-16 DIAGNOSIS — F319 Bipolar disorder, unspecified: Secondary | ICD-10-CM | POA: Diagnosis not present

## 2022-04-16 DIAGNOSIS — K219 Gastro-esophageal reflux disease without esophagitis: Secondary | ICD-10-CM | POA: Insufficient documentation

## 2022-04-16 DIAGNOSIS — K222 Esophageal obstruction: Secondary | ICD-10-CM

## 2022-04-16 DIAGNOSIS — Z8249 Family history of ischemic heart disease and other diseases of the circulatory system: Secondary | ICD-10-CM | POA: Insufficient documentation

## 2022-04-16 HISTORY — PX: BALLOON DILATION: SHX5330

## 2022-04-16 HISTORY — PX: ESOPHAGOGASTRODUODENOSCOPY (EGD) WITH PROPOFOL: SHX5813

## 2022-04-16 SURGERY — ESOPHAGOGASTRODUODENOSCOPY (EGD) WITH PROPOFOL
Anesthesia: General

## 2022-04-16 MED ORDER — LIDOCAINE HCL 1 % IJ SOLN
INTRAMUSCULAR | Status: DC | PRN
  Administered 2022-04-16: 50 mg via INTRADERMAL

## 2022-04-16 MED ORDER — PROPOFOL 10 MG/ML IV BOLUS
INTRAVENOUS | Status: DC | PRN
  Administered 2022-04-16: 100 mg via INTRAVENOUS
  Administered 2022-04-16: 30 mg via INTRAVENOUS
  Administered 2022-04-16: 50 mg via INTRAVENOUS

## 2022-04-16 MED ORDER — LACTATED RINGERS IV SOLN: INTRAVENOUS | Status: DC

## 2022-04-16 NOTE — Anesthesia Postprocedure Evaluation (Signed)
Anesthesia Post Note  Patient: Ruth Duncan  Procedure(s) Performed: ESOPHAGOGASTRODUODENOSCOPY (EGD) WITH PROPOFOL BALLOON DILATION  Patient location during evaluation: Phase II Anesthesia Type: General Level of consciousness: awake Pain management: pain level controlled Vital Signs Assessment: post-procedure vital signs reviewed and stable Respiratory status: spontaneous breathing and respiratory function stable Cardiovascular status: blood pressure returned to baseline and stable Postop Assessment: no headache and no apparent nausea or vomiting Anesthetic complications: no Comments: Late entry   No notable events documented.   Last Vitals:  Vitals:   04/16/22 0744 04/16/22 0917  BP: 99/75 94/69  Pulse: 61 64  Resp: 19 12  Temp: 36.7 C 36.4 C  SpO2: 97% 97%    Last Pain:  Vitals:   04/16/22 0917  TempSrc: Axillary  PainSc: 0-No pain                 Windell Norfolk

## 2022-04-16 NOTE — Op Note (Signed)
[facility] Patient Name: Ruth Duncan Procedure Date: 04/16/2022 8:55 AM MRN: [id number] Date of Birth: [birth year] Attending MD: Hennie Duos. Marletta Lor , [place], [id number] CSN: [id number] Age: 52 Admit Type: Outpatient Procedure:                Upper GI endoscopy Indications:              Dysphagia Providers:                Hennie Duos. Marletta Lor, DO, Jannett Celestine, RN, Pandora Leiter, Technician Referring MD:              Medicines:                See the Anesthesia note for documentation of the                            administered medications Complications:            No immediate complications. Estimated Blood Loss:     Estimated blood loss was minimal. Procedure:                Pre-Anesthesia Assessment:                           - The anesthesia plan was to use monitored                            anesthesia care (MAC).                           After obtaining informed consent, the endoscope was                            passed under direct vision. Throughout the                            procedure, the patient's blood pressure, pulse, and                            oxygen saturations were monitored continuously. The                            GIF-H190 ([id number]) scope was introduced through the                            mouth, and advanced to the second part of duodenum.                            The upper GI endoscopy was accomplished without                            difficulty. The patient tolerated the procedure                            well. Scope In: 9:08:08 AM Scope  Out: 9:14:51 AM Total Procedure Duration: 0 hours 6 minutes 43 seconds  Findings:      A small hiatal hernia was present.      A mild Schatzki ring was found in the distal esophagus. A TTS dilator       was passed through the scope. Dilation with an 18-19-20 mm balloon       dilator was performed to 20 mm. The dilation site was examined and       showed mild mucosal disruption  and moderate improvement in luminal       narrowing. Ring was then further disrupted with cold forceps.      A few small fundic gland polyps with no bleeding and no stigmata of       recent bleeding were found in the gastric fundus.      The duodenal bulb, first portion of the duodenum and second portion of       the duodenum were normal. Impression:               - Small hiatal hernia.                           - Mild Schatzki ring. Dilated.                           - A few gastric polyps.                           - Normal duodenal bulb, first portion of the                            duodenum and second portion of the duodenum.                           - No specimens collected. Moderate Sedation:      Per Anesthesia Care Recommendation:           - Patient has a contact number available for                            emergencies. The signs and symptoms of potential                            delayed complications were discussed with the                            patient. Return to normal activities tomorrow.                            Written discharge instructions were provided to the                            patient.                           - Resume previous diet.                           - Continue present medications.                           -  Repeat upper endoscopy PRN for retreatment.                           - Return to GI clinic in 6 months. Procedure Code(s):        --- Professional ---                           [phone], Esophagogastroduodenoscopy, flexible,                            transoral; with transendoscopic balloon dilation of                            esophagus (less than 30 mm diameter) Diagnosis Code(s):        --- Professional ---                           K44.9, Diaphragmatic hernia without obstruction or                            gangrene                           K22.2, Esophageal obstruction                           K31.7, Polyp of stomach and  duodenum                           R13.10, Dysphagia, unspecified CPT copyright 2022 American Medical Association. All rights reserved. The codes documented in this report are preliminary and upon coder review may  be revised to meet current compliance requirements. Hennie Duos. Marletta Lor, DO Hennie Duos. Marletta Lor, DO 04/16/2022 9:19:29 AM This report has been signed electronically. Number of Addenda: 0
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
## 2022-04-16 NOTE — Discharge Instructions (Addendum)
EGD Discharge instructions Please read the instructions outlined below and refer to this sheet in the next few weeks. These discharge instructions provide you with general information on caring for yourself after you leave the hospital. Your doctor may also give you specific instructions. While your treatment has been planned according to the most current medical practices available, unavoidable complications occasionally occur. If you have any problems or questions after discharge, please call your doctor. ACTIVITY You may resume your regular activity but move at a slower pace for the next 24 hours.  Take frequent rest periods for the next 24 hours.  Walking will help expel (get rid of) the air and reduce the bloated feeling in your abdomen.  No driving for 24 hours (because of the anesthesia (medicine) used during the test).  You may shower.  Do not sign any important legal documents or operate any machinery for 24 hours (because of the anesthesia used during the test).  NUTRITION Drink plenty of fluids.  You may resume your normal diet.  Begin with a light meal and progress to your normal diet.  Avoid alcoholic beverages for 24 hours or as instructed by your caregiver.  MEDICATIONS You may resume your normal medications unless your caregiver tells you otherwise.  WHAT YOU CAN EXPECT TODAY You may experience abdominal discomfort such as a feeling of fullness or "gas" pains.  FOLLOW-UP Your doctor will discuss the results of your test with you.  SEEK IMMEDIATE MEDICAL ATTENTION IF ANY OF THE FOLLOWING OCCUR: Excessive nausea (feeling sick to your stomach) and/or vomiting.  Severe abdominal pain and distention (swelling).  Trouble swallowing.  Temperature over 101 F (37.8 C).  Rectal bleeding or vomiting of blood.   Your upper endoscopy revealed a small hiatal hernia.  I again found the previously noted Schatzki's ring.  I stretched this again today.  I then further disrupted it with  forceps.  Hopefully this improves your swallowing for a longer period of time.  Continue current medications.  Stomach and small bowel otherwise appeared normal.  Follow-up with GI in 3 to 4 months.  Office will notify you    I hope you have a great rest of your week!  Hennie Duos. Marletta Lor, D.O. Gastroenterology and Hepatology [facility]

## 2022-04-16 NOTE — Transfer of Care (Signed)
Immediate Anesthesia Transfer of Care Note  Patient: Ruth Duncan  Procedure(s) Performed: ESOPHAGOGASTRODUODENOSCOPY (EGD) WITH PROPOFOL BALLOON DILATION  Patient Location: Endoscopy Unit  Anesthesia Type:General  Level of Consciousness: drowsy  Airway & Oxygen Therapy: Patient Spontanous Breathing  Post-op Assessment: Report given to RN and Post -op Vital signs reviewed and stable  Post vital signs: Reviewed and stable  Last Vitals:  Vitals Value Taken Time  BP 82/48   Temp 36.4 C 04/16/22 0917  Pulse 64 04/16/22 0917  Resp 12 04/16/22 0917  SpO2 97 % 04/16/22 0917    Last Pain:  Vitals:   04/16/22 0917  TempSrc: Axillary  PainSc: 0-No pain      Patients Stated Pain Goal: 8 (04/16/22 0744)  Complications: No notable events documented.

## 2022-04-16 NOTE — Anesthesia Preprocedure Evaluation (Signed)
Anesthesia Evaluation  Patient identified by MRN, date of birth, ID band Patient awake    Reviewed: Allergy & Precautions, H&P , NPO status , Patient's Chart, lab work & pertinent test results, reviewed documented beta blocker date and time   History of Anesthesia Complications (+) PONV and history of anesthetic complications  Airway Mallampati: II  TM Distance: >3 FB Neck ROM: full    Dental no notable dental hx.    Pulmonary neg pulmonary ROS, former smoker   Pulmonary exam normal breath sounds clear to auscultation       Cardiovascular Exercise Tolerance: Good + angina  + CAD and + DOE  negative cardio ROS  Rhythm:regular Rate:Normal     Neuro/Psych  PSYCHIATRIC DISORDERS Anxiety Depression Bipolar Disorder Schizophrenia  negative neurological ROS  negative psych ROS   GI/Hepatic negative GI ROS, Neg liver ROS,GERD  ,,  Endo/Other  negative endocrine ROS    Renal/GU negative Renal ROS  negative genitourinary   Musculoskeletal   Abdominal   Peds  Hematology negative hematology ROS (+)   Anesthesia Other Findings   Reproductive/Obstetrics negative OB ROS                             Anesthesia Physical Anesthesia Plan  ASA: 2  Anesthesia Plan: General   Post-op Pain Management:    Induction:   PONV Risk Score and Plan: Propofol infusion  Airway Management Planned:   Additional Equipment:   Intra-op Plan:   Post-operative Plan:   Informed Consent: I have reviewed the patients History and Physical, chart, labs and discussed the procedure including the risks, benefits and alternatives for the proposed anesthesia with the patient or authorized representative who has indicated his/her understanding and acceptance.     Dental Advisory Given  Plan Discussed with: CRNA  Anesthesia Plan Comments:        Anesthesia Quick Evaluation

## 2022-04-21 ENCOUNTER — Telehealth (HOSPITAL_BASED_OUTPATIENT_CLINIC_OR_DEPARTMENT_OTHER): Payer: 59 | Admitting: Psychiatry

## 2022-04-21 ENCOUNTER — Encounter (HOSPITAL_COMMUNITY): Payer: Self-pay | Admitting: Psychiatry

## 2022-04-21 DIAGNOSIS — F900 Attention-deficit hyperactivity disorder, predominantly inattentive type: Secondary | ICD-10-CM

## 2022-04-21 DIAGNOSIS — F250 Schizoaffective disorder, bipolar type: Secondary | ICD-10-CM

## 2022-04-21 DIAGNOSIS — F410 Panic disorder [episodic paroxysmal anxiety] without agoraphobia: Secondary | ICD-10-CM

## 2022-04-21 MED ORDER — LITHIUM CARBONATE 300 MG PO TABS: ORAL_TABLET | ORAL | 2 refills | Status: DC

## 2022-04-21 MED ORDER — DOXEPIN HCL 10 MG PO CAPS: 10.0000 mg | ORAL_CAPSULE | Freq: Every evening | ORAL | 0 refills | Status: DC | PRN

## 2022-04-21 MED ORDER — BUPROPION HCL ER (XL) 150 MG PO TB24
150.0000 mg | ORAL_TABLET | ORAL | 2 refills | Status: DC
Start: 2022-04-21 — End: 2022-07-28

## 2022-04-21 MED ORDER — ATOMOXETINE HCL 25 MG PO CAPS
ORAL_CAPSULE | ORAL | 2 refills | Status: DC
Start: 2022-04-21 — End: 2022-08-16

## 2022-04-21 NOTE — Progress Notes (Signed)
[facility] MD Virtual Progress Note   Patient Location: Work Provider Location: Home Office  I connect with patient by video and verified that I am speaking with correct person by using two identifiers. I discussed the limitations of evaluation and management by telemedicine and the availability of in person appointments. I also discussed with the patient that there may be a patient responsible charge related to this service. The patient expressed understanding and agreed to proceed.  Ruth Duncan [id number] 52 y.o.  04/21/2022 2:17 PM  History of Present Illness:  Patient is evaluated by video session.  She is at work.  She reported a lot of stress due to her general health condition.  She recently had endoscopy and also seeing cardiology and having a test coming up.  Patient told her daughter need CT scan of the lung because they find something.  Her LDL is 111 and her cholesterol doses increase.  She admitted some concern and nervousness about her general health but denies any crying spells, feeling of hopelessness or worthlessness.  She has not taken Xanax in the past few weeks.  She also takes doxepin when she cannot sleep as most of the night she sleeps okay.  She has chronic issues related to her grandchild custody.  She is hoping the custody battle is over but currently she is able to see the grandkids every weekend.  She is not happy that her ex-husband continues to drink and now she believes using drugs.  Patient taking meloxicam and wondering if it is okay to take the lithium.  I explained that his interaction with lithium and her lithium level is persistently low.  Her last level was drawn and it was less than 1.  She is reluctant to stop the lithium because she feels it is working and helping her mood irritability and paranoia.  I encourage if she like to keep the lithium then asked the doctor to switch her meloxicam to some other medication.  Patient agreed to talk to her  doctor.  Recently her 31 year old son who does not gamble went to [place] and took her.  She really enjoyed the company but did not do gambling.  Patient liked the trip.  Patient denies drinking or using any illegal substances.  Her appetite is okay.  Her mood still sometimes ups and downs but manageable.  Her attention and focus is okay with the Strattera.  Past Psychiatric History: H/O alcohol, cocaine and inpatient at [facility] in 2007 and rehabilitation at [facility] however things are now slowly and gradually tried Strattera, Prozac, Trilafon, Abilify, Lamictal, Latuda and Trazadone. No h/o suicidal attempt.  H/O trouble with the law later probation. H/O paranoia, hallucination, anxiety.    Outpatient Encounter Medications as of 04/21/2022  Medication Sig   ALPRAZolam (XANAX) 0.25 MG tablet Take 1 tablet (0.25 mg total) by mouth daily as needed. for anxiety   atomoxetine (STRATTERA) 25 MG capsule TAKE 1 CAPSULE BY MOUTH EVERY DAY   buPROPion (WELLBUTRIN XL) 150 MG 24 hr tablet Take 1 tablet (150 mg total) by mouth every morning.   dexlansoprazole (DEXILANT) 60 MG capsule Take 1 capsule (60 mg total) by mouth daily.   dicyclomine (BENTYL) 10 MG capsule Take 1 capsule (10 mg total) by mouth 3 (three) times daily before meals. As needed (Patient taking differently: Take 10 mg by mouth 3 (three) times daily as needed for spasms.)   doxepin (SINEQUAN) 10 MG capsule Take 1 capsule (10 mg total) by mouth  at bedtime.   estradiol (ESTRACE) 0.1 MG/GM vaginal cream Place 1 g vaginally at bedtime.   lithium 300 MG tablet Take 2 tab in am, 2 at noon and 2 tab in evening   nitrofurantoin (MACRODANTIN) 50 MG capsule Take 50 mg by mouth daily as needed (UTI).   nitroGLYCERIN (NITROSTAT) 0.4 MG SL tablet Place 0.4 mg under the tongue every 5 (five) minutes as needed for chest pain.   ondansetron (ZOFRAN) 4 MG tablet Take 1 tablet (4 mg total) by mouth every 6 (six) hours as needed for nausea or vomiting.    rosuvastatin (CRESTOR) 10 MG tablet Take 1 tablet (10 mg total) by mouth daily.   valACYclovir (VALTREX) 500 MG tablet Take 500 mg by mouth daily as needed (outbreaks).   No facility-administered encounter medications on file as of 04/21/2022.    Recent Results (from the past 2160 hour(s))  Lithium level     Status: Abnormal   Collection Time: 01/22/22 11:38 AM  Result Value Ref Range   Lithium Lvl <0.1 (L) 0.5 - 1.2 mmol/L    Comment: A concentration of 0.5-0.8 mmol/L is advised for long-term use; concentrations of up to 1.2 mmol/L may be necessary during acute treatment. **Verified by repeat analysis**                                  Detection Limit = 0.1                           <0.1 indicates None Detected   CMP14+EGFR     Status: None   Collection Time: 01/22/22 11:38 AM  Result Value Ref Range   Glucose 84 70 - 99 mg/dL   BUN 10 6 - 24 mg/dL   Creatinine, Ser 1.61 0.57 - 1.00 mg/dL   eGFR 096 >04 VW/UJW/1.19   BUN/Creatinine Ratio 16 9 - 23   Sodium 141 134 - 144 mmol/L   Potassium 4.2 3.5 - 5.2 mmol/L   Chloride 101 96 - 106 mmol/L   CO2 25 20 - 29 mmol/L   Calcium 9.6 8.7 - 10.2 mg/dL   Total Protein 7.5 6.0 - 8.5 g/dL   Albumin 4.7 3.8 - 4.9 g/dL   Globulin, Total 2.8 1.5 - 4.5 g/dL   Albumin/Globulin Ratio 1.7 1.2 - 2.2   Bilirubin Total 0.5 0.0 - 1.2 mg/dL   Alkaline Phosphatase 82 44 - 121 IU/L   AST 13 0 - 40 IU/L   ALT 11 0 - 32 IU/L  CBC with Differential/Platelet     Status: None   Collection Time: 01/22/22 11:40 AM  Result Value Ref Range   WBC 6.3 3.4 - 10.8 x10E3/uL   RBC 4.66 3.77 - 5.28 x10E6/uL   Hemoglobin 14.1 11.1 - 15.9 g/dL   Hematocrit 14.7 82.9 - 46.6 %   MCV 90 79 - 97 fL   MCH 30.3 26.6 - 33.0 pg   MCHC 33.6 31.5 - 35.7 g/dL   RDW 56.2 13.0 - 86.5 %   Platelets 253 150 - 450 x10E3/uL   Neutrophils 53 Not Estab. %   Lymphs 31 Not Estab. %   Monocytes 9 Not Estab. %   Eos 6 Not Estab. %   Basos 1 Not Estab. %   Neutrophils Absolute  3.4 1.4 - 7.0 x10E3/uL   Lymphocytes Absolute 2.0 0.7 - 3.1 x10E3/uL   Monocytes  Absolute 0.5 0.1 - 0.9 x10E3/uL   EOS (ABSOLUTE) 0.4 0.0 - 0.4 x10E3/uL   Basophils Absolute 0.1 0.0 - 0.2 x10E3/uL   Immature Granulocytes 0 Not Estab. %   Immature Grans (Abs) 0.0 0.0 - 0.1 x10E3/uL  Basic Metabolic Panel (BMET)     Status: None   Collection Time: 01/22/22 11:40 AM  Result Value Ref Range   Glucose 87 70 - 99 mg/dL   BUN 10 6 - 24 mg/dL   Creatinine, Ser 0.98 0.57 - 1.00 mg/dL   eGFR 119 >14 NW/GNF/6.21   BUN/Creatinine Ratio 16 9 - 23   Sodium 139 134 - 144 mmol/L   Potassium 4.5 3.5 - 5.2 mmol/L   Chloride 100 96 - 106 mmol/L   CO2 26 20 - 29 mmol/L   Calcium 9.7 8.7 - 10.2 mg/dL  LDL cholesterol, direct     Status: Abnormal   Collection Time: 04/12/22  4:15 PM  Result Value Ref Range   LDL Direct 111 (H) 0 - 99 mg/dL     Psychiatric Specialty Exam: Physical Exam  Review of Systems  Weight 128 lb (58.1 kg).There is no height or weight on file to calculate BMI.  General Appearance: Casual  Eye Contact:  Good  Speech:   Fast but coherent  Volume:  Increased  Mood:  Anxious  Affect:  Labile  Thought Process:  Goal Directed  Orientation:  Full (Time, Place, and Person)  Thought Content:  WDL  Suicidal Thoughts:  No  Homicidal Thoughts:  No  Memory:  Immediate;   Good Recent;   Good Remote;   Good  Judgement:  Good  Insight:  Good  Psychomotor Activity:  Normal  Concentration:  Concentration: Fair and Attention Span: Fair  Recall:  Fair  Fund of Knowledge:  Good  Language:  Good  Akathisia:  No  Handed:  Right  AIMS (if indicated):     Assets:  Communication Skills Desire for Improvement Housing Resilience Social Support Talents/Skills Transportation  ADL's:  Intact  Cognition:  WNL  Sleep:  ok     Assessment/Plan: Schizoaffective disorder, bipolar type - Plan: buPROPion (WELLBUTRIN XL) 150 MG 24 hr tablet, doxepin (SINEQUAN) 10 MG capsule, lithium 300  MG tablet  Panic attack - Plan: doxepin (SINEQUAN) 10 MG capsule  Attention deficit hyperactivity disorder (ADHD), predominantly inattentive type - Plan: atomoxetine (STRATTERA) 25 MG capsule  I reviewed blood work results.  Her LDL is 111, lithium level is subtherapeutic.  I reviewed current medication.  She is getting meloxicam and I recommend should try a different medication for inflammation due to interaction with lithium.  We will lower the lithium level is low but patient does not want to change as she feels it helps her mood, paranoia and hallucinations.  Patient will contact her physician to switch to a different medication.  She does not want a new prescription Xanax as not taking regularly.  She also taking doxepin only when she cannot sleep.  We will provide a 30 tablets of doxepin 10 mg, continue Wellbutrin XL 150 mg daily, lithium 600 mg 3 times a day, Strattera 25 mg daily.  Recommend to call us back if she has any question or any concern.  Follow-up in 3 months.   Follow Up Instructions:     I discussed the assessment and treatment plan with the patient. The patient was provided an opportunity to ask questions and all were answered. The patient agreed with the plan and demonstrated an understanding  of the instructions.   The patient was advised to call back or seek an in-person evaluation if the symptoms worsen or if the condition fails to improve as anticipated.    Collaboration of Care: Other provider involved in patient's care AEB notes are available in epic to review.  Patient/Guardian was advised Release of Information must be obtained prior to any record release in order to collaborate their care with an outside provider. Patient/Guardian was advised if they have not already done so to contact the registration department to sign all necessary forms in order for [place] to release information regarding their care.   Consent: Patient/Guardian gives verbal consent for treatment  and assignment of benefits for services provided during this visit. Patient/Guardian expressed understanding and agreed to proceed.     I provided 29 minutes of non face to face time during this encounter.  Note: This document was prepared by [employer] voice dictation technology and any errors that results from this process are unintentional.    Cleotis Nipper, MD 04/21/2022

## 2022-04-22 ENCOUNTER — Encounter (HOSPITAL_COMMUNITY): Payer: Self-pay | Admitting: Internal Medicine

## 2022-04-23 ENCOUNTER — Ambulatory Visit (HOSPITAL_BASED_OUTPATIENT_CLINIC_OR_DEPARTMENT_OTHER)
Admission: RE | Admit: 2022-04-23 | Discharge: 2022-04-23 | Disposition: A | Discharge status: Home / Self Care | Payer: 59 | Source: Ambulatory Visit | Attending: Internal Medicine | Admitting: Internal Medicine

## 2022-04-23 DIAGNOSIS — Z87891 Personal history of nicotine dependence: Secondary | ICD-10-CM | POA: Diagnosis present

## 2022-05-22 ENCOUNTER — Other Ambulatory Visit (HOSPITAL_COMMUNITY): Payer: Self-pay | Admitting: Psychiatry

## 2022-05-22 DIAGNOSIS — F410 Panic disorder [episodic paroxysmal anxiety] without agoraphobia: Secondary | ICD-10-CM

## 2022-05-22 DIAGNOSIS — F250 Schizoaffective disorder, bipolar type: Secondary | ICD-10-CM

## 2022-06-08 ENCOUNTER — Encounter: Payer: Self-pay | Admitting: Internal Medicine

## 2022-07-13 ENCOUNTER — Ambulatory Visit: Payer: 59 | Admitting: Gastroenterology

## 2022-07-16 ENCOUNTER — Ambulatory Visit: Payer: 59

## 2022-07-17 ENCOUNTER — Other Ambulatory Visit (HOSPITAL_COMMUNITY): Payer: Self-pay | Admitting: Psychiatry

## 2022-07-17 DIAGNOSIS — F250 Schizoaffective disorder, bipolar type: Secondary | ICD-10-CM

## 2022-07-21 ENCOUNTER — Telehealth (HOSPITAL_COMMUNITY): Payer: 59 | Admitting: Psychiatry

## 2022-07-22 ENCOUNTER — Ambulatory Visit: Payer: 59 | Attending: Internal Medicine

## 2022-07-26 ENCOUNTER — Ambulatory Visit (INDEPENDENT_AMBULATORY_CARE_PROVIDER_SITE_OTHER): Payer: 59 | Admitting: Gastroenterology

## 2022-07-26 ENCOUNTER — Encounter: Payer: Self-pay | Admitting: Gastroenterology

## 2022-07-26 VITALS — BP 96/66 | HR 81 | Temp 98.6°F | Ht 63.0 in | Wt 123.0 lb

## 2022-07-26 DIAGNOSIS — K219 Gastro-esophageal reflux disease without esophagitis: Secondary | ICD-10-CM | POA: Diagnosis not present

## 2022-07-26 DIAGNOSIS — R1319 Other dysphagia: Secondary | ICD-10-CM

## 2022-07-26 NOTE — Progress Notes (Signed)
GI Office Note    Referring Provider: Exie Parody Primary Care Physician:  Exie Parody  Primary Gastroenterologist: Hennie Duos. Marletta Lor, DO   Chief Complaint   Chief Complaint  Patient presents with   Follow-up    Follow up after EGD. Pt still getting choked when she swallows    History of Present Illness   Ruth Duncan is a 52 y.o. female presenting today for follow up.  Last seen in 01/2022.   She has history of GERD, dysphagia, subtotal colectomy in 2011 for diverticulitis.    Completed EGD back in April for dysphagia.  Noted to have mild Schatzki ring which was dilated.  Initially the first couple of months, she had significant improvement in her dysphagia shortly after that her symptoms started returning.  Notes dysphagia to solids, liquids, pills.  When she eats she feels like something is stuck in her chest.  She has to breathe deeply, dry swallow multiple times to get it to go down.  No coughing or strangling with swallowing.  Heartburn well-controlled on Dexilant.  No abdominal pain.  Bowel movements ok. No melena, brbpr.   Previously had negative celiac serologies.   CT abdomen pelvis with contrast January 2024: No acute intra-abdominal or intrapelvic process.  Postsurgical changes from prior subtotal colectomy.  EGD 04/2022: small hh, mild Schatzki ring distal esophagus s/p dilation. Few fundic gland gastric polyps.    EGD in March 2022 with small hiatal hernia, Schatzki's ring dilated, gastritis biopsied, normal examined duodenum. Biopsies negative for H. pylori.    Flex sig 02/2014: normal anastomosis 20cm from anal verge. Single erosion noted at anastomosis. Next flex sig in 10 years. Suggest mini-prep prior to next flex sig.   Medications   Current Outpatient Medications  Medication Sig Dispense Refill   ALPRAZolam (XANAX) 0.25 MG tablet Take 1 tablet (0.25 mg total) by mouth daily as needed. for anxiety 20 tablet 0   atomoxetine  (STRATTERA) 25 MG capsule TAKE 1 CAPSULE BY MOUTH EVERY DAY 30 capsule 2   dexlansoprazole (DEXILANT) 60 MG capsule Take 1 capsule (60 mg total) by mouth daily. 90 capsule 3   dicyclomine (BENTYL) 10 MG capsule Take 1 capsule (10 mg total) by mouth 3 (three) times daily before meals. As needed (Patient taking differently: Take 10 mg by mouth 3 (three) times daily as needed for spasms.) 270 capsule 1   doxepin (SINEQUAN) 10 MG capsule Take 1 capsule (10 mg total) by mouth at bedtime as needed (sleep). 30 capsule 0   estradiol (ESTRACE) 0.1 MG/GM vaginal cream Place 1 g vaginally at bedtime.     lithium 300 MG tablet Take 2 tab in am, 2 at noon and 2 tab in evening 180 tablet 2   nitrofurantoin (MACRODANTIN) 50 MG capsule Take 50 mg by mouth daily as needed (UTI).  5   nitroGLYCERIN (NITROSTAT) 0.4 MG SL tablet Place 0.4 mg under the tongue every 5 (five) minutes as needed for chest pain.     ondansetron (ZOFRAN) 4 MG tablet Take 1 tablet (4 mg total) by mouth every 6 (six) hours as needed for nausea or vomiting. 30 tablet 0   rosuvastatin (CRESTOR) 10 MG tablet Take 1 tablet (10 mg total) by mouth daily. 90 tablet 3   valACYclovir (VALTREX) 500 MG tablet Take 500 mg by mouth daily as needed (outbreaks).     buPROPion (WELLBUTRIN XL) 150 MG 24 hr tablet Take 1 tablet (150 mg total)  by mouth every morning. 30 tablet 2   No current facility-administered medications for this visit.    Allergies   Allergies as of 07/26/2022   (No Known Allergies)        Review of Systems   General: Negative for anorexia, weight loss, fever, chills, fatigue, weakness. ENT: Negative for hoarseness, nasal congestion. CV: Negative for chest pain, angina, palpitations, dyspnea on exertion, peripheral edema.  Respiratory: Negative for dyspnea at rest, dyspnea on exertion, cough, sputum, wheezing.  GI: See history of present illness. GU:  Negative for dysuria, hematuria, urinary incontinence, urinary frequency,  nocturnal urination.  Endo: Negative for unusual weight change.     Physical Exam   BP 96/66   Pulse 81   Temp 98.6 F (37 C)   Ht 5\' 3"  (1.6 m)   Wt 123 lb (55.8 kg)   BMI 21.79 kg/m    General: Well-nourished, well-developed in no acute distress.  Eyes: No icterus. Mouth: Oropharyngeal mucosa moist and pink  Abdomen: Bowel sounds are normal,  nondistended, no hepatosplenomegaly or masses,  no abdominal bruits or hernia , no rebound or guarding. Mild diffuse tenderness Rectal: not performed  Extremities: No lower extremity edema. No clubbing or deformities. Neuro: Alert and oriented x 4   Skin: Warm and dry, no jaundice.   Psych: Alert and cooperative, normal mood and affect.  Labs   Lab Results  Component Value Date   ALT 11 01/22/2022   AST 13 01/22/2022   ALKPHOS 82 01/22/2022   BILITOT 0.5 01/22/2022   Lab Results  Component Value Date   WBC 6.3 01/22/2022   HGB 14.1 01/22/2022   HCT 42.0 01/22/2022   MCV 90 01/22/2022   PLT 253 01/22/2022   Lab Results  Component Value Date   NA 139 01/22/2022   CL 100 01/22/2022   K 4.5 01/22/2022   CO2 26 01/22/2022   BUN 10 01/22/2022   CREATININE 0.62 01/22/2022   EGFR 107 01/22/2022   CALCIUM 9.7 01/22/2022   PHOS 2.5 01/08/2009   ALBUMIN 4.7 01/22/2022   GLUCOSE 87 01/22/2022    Imaging Studies   No results found.  Assessment   *Esophageal dysphagia *GERD  Short lived response to dilation of mild Schatzki ring. Recommend barium esophagram to evaluate for esophageal stricture. Cannot rule out esophageal motility disorder.   Heartburn well controlled.    PLAN   Continue Dexilant 60mg  daily before breakfast. Reinforced antireflux measures. Barium pill esophagram.   Leanna Battles. Melvyn Neth, MHS, PA-C [facility]

## 2022-07-26 NOTE — Patient Instructions (Signed)
Xray of your esophagus to be schedule.  Continue Dexilant 60mg  daily.

## 2022-07-27 ENCOUNTER — Other Ambulatory Visit: Payer: 59

## 2022-07-27 ENCOUNTER — Other Ambulatory Visit: Payer: Self-pay

## 2022-07-27 ENCOUNTER — Other Ambulatory Visit: Payer: Self-pay | Admitting: Internal Medicine

## 2022-07-27 DIAGNOSIS — I25110 Atherosclerotic heart disease of native coronary artery with unstable angina pectoris: Secondary | ICD-10-CM

## 2022-07-27 DIAGNOSIS — E782 Mixed hyperlipidemia: Secondary | ICD-10-CM

## 2022-07-28 ENCOUNTER — Ambulatory Visit: Payer: 59

## 2022-07-28 LAB — LIPID PANEL W/O CHOL/HDL RATIO
Cholesterol, Total: 186 mg/dL (ref 100–199)
HDL: 53 mg/dL (ref >39)
LDL Chol Calc (NIH): 87 mg/dL (ref 0–99)
Triglycerides: 282 mg/dL — ABNORMAL HIGH (ref 0–149)
VLDL Cholesterol Cal: 46 mg/dL — ABNORMAL HIGH (ref 5–40)

## 2022-07-28 LAB — ALT: ALT: 15 IU/L (ref 0–32)

## 2022-07-30 ENCOUNTER — Ambulatory Visit (HOSPITAL_COMMUNITY): Payer: 59

## 2022-08-01 ENCOUNTER — Other Ambulatory Visit (HOSPITAL_COMMUNITY): Payer: Self-pay | Admitting: Psychiatry

## 2022-08-01 DIAGNOSIS — F900 Attention-deficit hyperactivity disorder, predominantly inattentive type: Secondary | ICD-10-CM

## 2022-08-05 ENCOUNTER — Telehealth: Payer: Self-pay | Admitting: Internal Medicine

## 2022-08-05 NOTE — Telephone Encounter (Signed)
  Patient would like someone to call her to go over her last lab results with her.

## 2022-08-10 MED ORDER — ROSUVASTATIN CALCIUM 20 MG PO TABS: 20.0000 mg | ORAL_TABLET | Freq: Every day | ORAL | 3 refills | Status: DC

## 2022-08-10 NOTE — Telephone Encounter (Signed)
Triglycerides can increase from drinking alcohol, elevated blood sugar, eating processed foods, or from medications such as her Latuda but difficult to say which

## 2022-08-10 NOTE — Telephone Encounter (Signed)
The patient has been notified of the result and verbalized understanding.  All questions (if any) were answered. Arvid Right Brinnley Lacap, RN 08/10/2022 8:12 AM   Pt will have FLP, ALT drawn at next OV with Dr. Izora Ribas on 10/20/22 advised to fast prior.    Pt has questions about elevated triglycerides wants to know why number jumped so high.  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  She reports doesn't eat a lot of junk. Advised will send message to our pharmacy team for review.

## 2022-08-10 NOTE — Telephone Encounter (Signed)
-----   Message from Christell Constant sent at 08/08/2022  2:07 PM EDT ----- Results: LDL has improved but not at goal Plan: Increase to rosuvastatin 20 mg and labs in three months  Christell Constant, MD

## 2022-08-10 NOTE — Telephone Encounter (Signed)
Called pt advised of pharmacist response:  Triglycerides can increase from drinking alcohol, elevated blood sugar, eating processed foods, or from medications such as her Latuda but difficult to say which   Pt reports does not drink any alcohol, doesn't check blood sugar, doesn't eat much processed foods, and has been on [place] and never had an issue. Advised pt to start rosuvastatin as ordered recheck labs in Oct as planned and see how triglycerides are at that time.  If still elevated will have MD address. Pt is agreeable to plan.

## 2022-08-16 ENCOUNTER — Other Ambulatory Visit (HOSPITAL_COMMUNITY): Payer: Self-pay

## 2022-08-16 ENCOUNTER — Telehealth (HOSPITAL_COMMUNITY): Payer: 59 | Admitting: Psychiatry

## 2022-08-16 DIAGNOSIS — F900 Attention-deficit hyperactivity disorder, predominantly inattentive type: Secondary | ICD-10-CM

## 2022-08-16 DIAGNOSIS — F410 Panic disorder [episodic paroxysmal anxiety] without agoraphobia: Secondary | ICD-10-CM

## 2022-08-16 DIAGNOSIS — F250 Schizoaffective disorder, bipolar type: Secondary | ICD-10-CM

## 2022-08-16 MED ORDER — ATOMOXETINE HCL 25 MG PO CAPS
ORAL_CAPSULE | ORAL | 2 refills | Status: DC
Start: 2022-08-16 — End: 2022-09-27

## 2022-08-16 MED ORDER — BUPROPION HCL ER (XL) 150 MG PO TB24
150.0000 mg | ORAL_TABLET | Freq: Every day | ORAL | 0 refills | Status: DC
Start: 2022-08-16 — End: 2022-09-27

## 2022-08-16 MED ORDER — LITHIUM CARBONATE 300 MG PO TABS
ORAL_TABLET | ORAL | 0 refills | Status: DC
Start: 2022-08-16 — End: 2022-09-27

## 2022-08-16 MED ORDER — DOXEPIN HCL 10 MG PO CAPS
10.0000 mg | ORAL_CAPSULE | Freq: Every evening | ORAL | 0 refills | Status: DC | PRN
Start: 2022-08-16 — End: 2022-09-27

## 2022-08-17 ENCOUNTER — Other Ambulatory Visit (HOSPITAL_COMMUNITY): Payer: 59

## 2022-08-20 ENCOUNTER — Ambulatory Visit (HOSPITAL_COMMUNITY)
Admission: RE | Admit: 2022-08-20 | Discharge: 2022-08-20 | Disposition: A | Discharge status: Home / Self Care | Payer: 59 | Source: Ambulatory Visit | Attending: Gastroenterology | Admitting: Gastroenterology

## 2022-08-20 DIAGNOSIS — R1319 Other dysphagia: Secondary | ICD-10-CM | POA: Insufficient documentation

## 2022-08-28 ENCOUNTER — Other Ambulatory Visit (HOSPITAL_COMMUNITY): Payer: Self-pay | Admitting: Psychiatry

## 2022-08-28 DIAGNOSIS — F250 Schizoaffective disorder, bipolar type: Secondary | ICD-10-CM

## 2022-09-14 ENCOUNTER — Telehealth: Payer: Self-pay

## 2022-09-14 NOTE — Telephone Encounter (Signed)
Pt called wanting to know if you have talked with Dr. Marletta Lor regarding her results. Pt states that she is still having trouble swallowing and is wanting to know what the next steps are. Please advise.

## 2022-09-15 ENCOUNTER — Other Ambulatory Visit (HOSPITAL_COMMUNITY): Payer: Self-pay | Admitting: Psychiatry

## 2022-09-15 DIAGNOSIS — F410 Panic disorder [episodic paroxysmal anxiety] without agoraphobia: Secondary | ICD-10-CM

## 2022-09-15 DIAGNOSIS — F250 Schizoaffective disorder, bipolar type: Secondary | ICD-10-CM

## 2022-09-15 NOTE — Telephone Encounter (Signed)
Left message to return call 

## 2022-09-15 NOTE — Telephone Encounter (Signed)
Pt was made aware and verbalized understanding.  

## 2022-09-15 NOTE — Telephone Encounter (Signed)
Tammy can you please let pt know that Kenney Houseman will be calling to schedule EGD/ED with Dr. Levon Hedger. Both Dr. Marletta Lor and Dr. Levon Hedger have reviewed her case. She may have a tight upper esophageal muscle that needs stretching and recommends a special type of esophageal dilation than what she had with her first dilation. Hopefully this will be more helpful for her symptoms.

## 2022-09-20 NOTE — Telephone Encounter (Signed)
Room 1 Thanks

## 2022-09-20 NOTE — Telephone Encounter (Signed)
Pt contacted and scheduled for 10/13/22 at 9:15am. Instructions sent via mail . No PA needed per insurance.

## 2022-09-20 NOTE — Telephone Encounter (Signed)
Pt left voicemail returning call. Returned call to patient but realized there is no ASA for pt. Advised pt that I would need to get with provider to see which room to place her in. Please advise ASA. Thank you. ( I do believe the ASA was in the secure chat but it has been purged)

## 2022-09-25 ENCOUNTER — Other Ambulatory Visit (HOSPITAL_COMMUNITY): Payer: Self-pay | Admitting: Psychiatry

## 2022-09-25 DIAGNOSIS — F250 Schizoaffective disorder, bipolar type: Secondary | ICD-10-CM

## 2022-09-27 ENCOUNTER — Encounter (HOSPITAL_COMMUNITY): Payer: Self-pay | Admitting: Psychiatry

## 2022-09-27 ENCOUNTER — Telehealth (HOSPITAL_BASED_OUTPATIENT_CLINIC_OR_DEPARTMENT_OTHER): Payer: 59 | Admitting: Psychiatry

## 2022-09-27 VITALS — Wt 125.0 lb

## 2022-09-27 DIAGNOSIS — F900 Attention-deficit hyperactivity disorder, predominantly inattentive type: Secondary | ICD-10-CM | POA: Diagnosis not present

## 2022-09-27 DIAGNOSIS — F410 Panic disorder [episodic paroxysmal anxiety] without agoraphobia: Secondary | ICD-10-CM

## 2022-09-27 DIAGNOSIS — F250 Schizoaffective disorder, bipolar type: Secondary | ICD-10-CM

## 2022-09-27 MED ORDER — LITHIUM CARBONATE 300 MG PO TABS: ORAL_TABLET | ORAL | 2 refills | Status: DC

## 2022-09-27 MED ORDER — DOXEPIN HCL 10 MG PO CAPS
10.0000 mg | ORAL_CAPSULE | Freq: Every evening | ORAL | 0 refills | Status: DC | PRN
Start: 2022-09-27 — End: 2022-12-27

## 2022-09-27 MED ORDER — BUPROPION HCL ER (XL) 150 MG PO TB24: 150.0000 mg | ORAL_TABLET | Freq: Every day | ORAL | 2 refills | Status: DC

## 2022-09-27 MED ORDER — ATOMOXETINE HCL 40 MG PO CAPS: ORAL_CAPSULE | ORAL | 2 refills | Status: DC

## 2022-09-27 NOTE — Progress Notes (Signed)
[facility] MD Virtual Progress Note   Patient Location: In Car Provider Location: Home Office  I connect with patient by video and verified that I am speaking with correct person by using two identifiers. I discussed the limitations of evaluation and management by telemedicine and the availability of in person appointments. I also discussed with the patient that there may be a patient responsible charge related to this service. The patient expressed understanding and agreed to proceed.  Ruth Duncan [id number] 52 y.o.  09/27/2022 1:07 PM  History of Present Illness:  Patient is evaluated video session.  She reported lately feeling sad and out but not sure why.  She is taking the medicine as prescribed.  She reported there are days when she does not enjoy it her life.  She did go to vacation but did not want to do anything.  She reported no more court dates as finally able to get the child support issues settle but are still need to work on custody battle.  She is taking lithium 600 mg 3 times a day.  She reported sometimes fatigue and tired and lack of energy and motivation to do things.  She denies any suicidal thoughts or homicidal thoughts.  She denies any hallucination or any paranoia.  Her mood swings and irritability is much better.  She really liked the lithium that is keeping her mood stable.  She denies any major panic attack.  Recently she had a blood work and her cholesterol is normal but triglycerides still high.  She is taking Crestor.  She is no longer taking meloxicam which was recommended not to take it due to interaction with lithium.  She has no tremor or shakes or any EPS.  She is compliant with doxepin which is helping her sleep, Wellbutrin 150 mg, Strattera and lithium.  She goes to work every day.  She denies drinking or using any illegal substances.    Past Psychiatric History: H/O alcohol, cocaine and inpatient at [facility] in 2007 and rehabilitation at [facility]  however things are now slowly and gradually tried Strattera, Prozac, Trilafon, Abilify, Lamictal, Latuda and Trazadone. No h/o suicidal attempt.  H/O trouble with the law later probation. H/O paranoia, hallucination, anxiety.    Outpatient Encounter Medications as of 09/27/2022  Medication Sig   ALPRAZolam (XANAX) 0.25 MG tablet Take 1 tablet (0.25 mg total) by mouth daily as needed. for anxiety   atomoxetine (STRATTERA) 25 MG capsule TAKE 1 CAPSULE BY MOUTH EVERY DAY   buPROPion (WELLBUTRIN XL) 150 MG 24 hr tablet Take 1 tablet (150 mg total) by mouth daily.   dexlansoprazole (DEXILANT) 60 MG capsule Take 1 capsule (60 mg total) by mouth daily.   dicyclomine (BENTYL) 10 MG capsule Take 1 capsule (10 mg total) by mouth 3 (three) times daily before meals. As needed (Patient taking differently: Take 10 mg by mouth 3 (three) times daily as needed for spasms.)   doxepin (SINEQUAN) 10 MG capsule Take 1 capsule (10 mg total) by mouth at bedtime as needed (sleep).   estradiol (ESTRACE) 0.1 MG/GM vaginal cream Place 1 g vaginally at bedtime.   lithium 300 MG tablet TAKE 2 TABLET IN AM, 2 TABLET AT NOON AND 2 TABLET IN EVENING   lurasidone (LATUDA) 80 MG TABS tablet Take 80 mg by mouth daily with breakfast.   nitrofurantoin (MACRODANTIN) 50 MG capsule Take 50 mg by mouth daily as needed (UTI).   nitroGLYCERIN (NITROSTAT) 0.4 MG SL tablet Place 0.4 mg under  the tongue every 5 (five) minutes as needed for chest pain.   ondansetron (ZOFRAN) 4 MG tablet Take 1 tablet (4 mg total) by mouth every 6 (six) hours as needed for nausea or vomiting.   rosuvastatin (CRESTOR) 20 MG tablet Take 1 tablet (20 mg total) by mouth daily.   valACYclovir (VALTREX) 500 MG tablet Take 500 mg by mouth daily as needed (outbreaks).   No facility-administered encounter medications on file as of 09/27/2022.    Recent Results (from the past 2160 hour(s))  Lipid Panel w/o Chol/HDL Ratio     Status: Abnormal   Collection Time:  07/27/22  1:07 PM  Result Value Ref Range   Cholesterol, Total 186 100 - 199 mg/dL   Triglycerides 546 (H) 0 - 149 mg/dL   HDL 53 >27 mg/dL   VLDL Cholesterol Cal 46 (H) 5 - 40 mg/dL   LDL Chol Calc (NIH) 87 0 - 99 mg/dL  ALT     Status: None   Collection Time: 07/27/22  1:07 PM  Result Value Ref Range   ALT 15 0 - 32 IU/L     Psychiatric Specialty Exam: Physical Exam  Review of Systems  Weight 125 lb (56.7 kg).There is no height or weight on file to calculate BMI.  General Appearance: Casual  Eye Contact:  Good  Speech:  Normal Rate  Volume:  Normal  Mood:  Dysphoric  Affect:  Appropriate  Thought Process:  Goal Directed  Orientation:  Full (Time, Place, and Person)  Thought Content:  Rumination  Suicidal Thoughts:  No  Homicidal Thoughts:  No  Memory:  Immediate;   Good Recent;   Good Remote;   Good  Judgement:  Intact  Insight:  Present  Psychomotor Activity:  Normal  Concentration:  Concentration: Good and Attention Span: Good  Recall:  Good  Fund of Knowledge:  Good  Language:  Good  Akathisia:  No  Handed:  Right  AIMS (if indicated):     Assets:  Communication Skills Desire for Improvement Housing Resilience Talents/Skills Transportation  ADL's:  Intact  Cognition:  WNL  Sleep:  ok     Assessment/Plan: Schizoaffective disorder, bipolar type (HCC) - Plan: buPROPion (WELLBUTRIN XL) 150 MG 24 hr tablet, doxepin (SINEQUAN) 10 MG capsule, lithium 300 MG tablet  Attention deficit hyperactivity disorder (ADHD), predominantly inattentive type - Plan: atomoxetine (STRATTERA) 40 MG capsule  Panic attack - Plan: doxepin (SINEQUAN) 10 MG capsule  I reviewed blood work results.  Her lithium level is subtherapeutic and I have discussed to try a different medicine but she does not want to change.  She believes lithium is working.  We talk about optimizing Strattera as she struggled with lack of motivation, energy.  She is having the swallowing test and may require  balloon endoscopic treatment.  I encouraged to keep that appointment.  We will try Strattera 40 mg.  She has no issue taking the capsule.  Continue Wellbutrin XL 150 mg daily, lithium 600 mg 3 times a day and doxepin 10 mg at bedtime.  If increase Strattera did not help then we will consider optimizing the Wellbutrin.  I will also do lithium level on her next appointment.  I encouraged to call us back if any question or any concern.  Follow-up in 3 months.   Follow Up Instructions:     I discussed the assessment and treatment plan with the patient. The patient was provided an opportunity to ask questions and all were answered. The patient  agreed with the plan and demonstrated an understanding of the instructions.   The patient was advised to call back or seek an in-person evaluation if the symptoms worsen or if the condition fails to improve as anticipated.    Collaboration of Care: Other provider involved in patient's care AEB notes are available in epic to review.  Patient/Guardian was advised Release of Information must be obtained prior to any record release in order to collaborate their care with an outside provider. Patient/Guardian was advised if they have not already done so to contact the registration department to sign all necessary forms in order for [place] to release information regarding their care.   Consent: Patient/Guardian gives verbal consent for treatment and assignment of benefits for services provided during this visit. Patient/Guardian expressed understanding and agreed to proceed.     I provided 32 minutes of non face to face time during this encounter.  Note: This document was prepared by [employer] voice dictation technology and any errors that results from this process are unintentional.    Cleotis Nipper, MD 09/27/2022

## 2022-09-28 ENCOUNTER — Other Ambulatory Visit: Payer: Self-pay | Admitting: Gastroenterology

## 2022-09-28 DIAGNOSIS — R195 Other fecal abnormalities: Secondary | ICD-10-CM

## 2022-10-06 ENCOUNTER — Telehealth (INDEPENDENT_AMBULATORY_CARE_PROVIDER_SITE_OTHER): Payer: Self-pay | Admitting: Gastroenterology

## 2022-10-06 NOTE — Telephone Encounter (Signed)
Contacted pt and pt has rescheduled from 10/9 to 10/18 at 9:30am with Dr.Castaneda. updated instructions sent via mail.

## 2022-10-06 NOTE — Telephone Encounter (Signed)
Voicemail transferred from [address]-pt states she is needing to reschedule EGD on 10/13/22 with Dr.Castaneda due to having another procedure the day before.  Returned call to pt but had to leave message to return call

## 2022-10-19 ENCOUNTER — Encounter: Payer: Self-pay | Admitting: Internal Medicine

## 2022-10-19 ENCOUNTER — Encounter (HOSPITAL_COMMUNITY)
Admission: RE | Admit: 2022-10-19 | Discharge: 2022-10-19 | Disposition: A | Discharge status: Home / Self Care | Payer: 59 | Source: Ambulatory Visit | Attending: Gastroenterology | Admitting: Gastroenterology

## 2022-10-20 ENCOUNTER — Other Ambulatory Visit (HOSPITAL_COMMUNITY): Payer: Self-pay | Admitting: Psychiatry

## 2022-10-20 ENCOUNTER — Ambulatory Visit: Payer: 59 | Admitting: Internal Medicine

## 2022-10-20 DIAGNOSIS — F250 Schizoaffective disorder, bipolar type: Secondary | ICD-10-CM

## 2022-10-20 DIAGNOSIS — F900 Attention-deficit hyperactivity disorder, predominantly inattentive type: Secondary | ICD-10-CM

## 2022-10-22 ENCOUNTER — Ambulatory Visit (HOSPITAL_COMMUNITY): Payer: 59 | Admitting: Anesthesiology

## 2022-10-22 ENCOUNTER — Encounter (HOSPITAL_COMMUNITY): Payer: Self-pay | Admitting: Gastroenterology

## 2022-10-22 ENCOUNTER — Encounter (HOSPITAL_COMMUNITY)
Admission: RE | Disposition: A | Discharge status: Home / Self Care | Payer: Self-pay | Source: Home / Self Care | Attending: Gastroenterology

## 2022-10-22 ENCOUNTER — Ambulatory Visit (HOSPITAL_COMMUNITY)
Admission: RE | Admit: 2022-10-22 | Discharge: 2022-10-22 | Disposition: A | Discharge status: Home / Self Care | Payer: 59 | Attending: Gastroenterology | Admitting: Gastroenterology

## 2022-10-22 DIAGNOSIS — K219 Gastro-esophageal reflux disease without esophagitis: Secondary | ICD-10-CM | POA: Insufficient documentation

## 2022-10-22 DIAGNOSIS — T182XXA Foreign body in stomach, initial encounter: Secondary | ICD-10-CM | POA: Diagnosis not present

## 2022-10-22 DIAGNOSIS — R933 Abnormal findings on diagnostic imaging of other parts of digestive tract: Secondary | ICD-10-CM

## 2022-10-22 DIAGNOSIS — F319 Bipolar disorder, unspecified: Secondary | ICD-10-CM | POA: Insufficient documentation

## 2022-10-22 DIAGNOSIS — R1310 Dysphagia, unspecified: Secondary | ICD-10-CM | POA: Diagnosis present

## 2022-10-22 DIAGNOSIS — Z8543 Personal history of malignant neoplasm of ovary: Secondary | ICD-10-CM | POA: Diagnosis not present

## 2022-10-22 HISTORY — PX: SAVORY DILATION: SHX5439

## 2022-10-22 HISTORY — PX: ESOPHAGOGASTRODUODENOSCOPY (EGD) WITH PROPOFOL: SHX5813

## 2022-10-22 SURGERY — ESOPHAGOGASTRODUODENOSCOPY (EGD) WITH PROPOFOL
Anesthesia: General

## 2022-10-22 MED ORDER — PROPOFOL 10 MG/ML IV BOLUS
INTRAVENOUS | Status: DC | PRN
  Administered 2022-10-22 (×4): 50 mg via INTRAVENOUS

## 2022-10-22 MED ORDER — LACTATED RINGERS IV SOLN: INTRAVENOUS | Status: DC | PRN

## 2022-10-22 NOTE — Anesthesia Preprocedure Evaluation (Addendum)
Anesthesia Evaluation  Patient identified by MRN, date of birth, ID band Patient awake    Reviewed: Allergy & Precautions, NPO status   History of Anesthesia Complications (+) PONV and history of anesthetic complications  Airway Mallampati: II       Dental   Pulmonary former smoker          Cardiovascular      Neuro/Psych   Anxiety        GI/Hepatic   Endo/Other    Renal/GU      Musculoskeletal   Abdominal   Peds  Hematology   Anesthesia Other Findings   Reproductive/Obstetrics                             Anesthesia Physical Anesthesia Plan  ASA: 2  Anesthesia Plan: General   Post-op Pain Management:    Induction:   PONV Risk Score and Plan: 2  Airway Management Planned:   Additional Equipment:   Intra-op Plan:   Post-operative Plan:   Informed Consent: I have reviewed the patients History and Physical, chart, labs and discussed the procedure including the risks, benefits and alternatives for the proposed anesthesia with the patient or authorized representative who has indicated his/her understanding and acceptance.     Dental advisory given  Plan Discussed with: CRNA and Anesthesiologist  Anesthesia Plan Comments:        Anesthesia Quick Evaluation

## 2022-10-22 NOTE — Discharge Instructions (Signed)
You are being discharged to home.  Resume your previous diet.  If persistent trouble swallowing, will repeat EGD dilation with the largest dilator.

## 2022-10-22 NOTE — Op Note (Addendum)
[facility] Patient Name: Ruth Duncan Procedure Date: 10/22/2022 9:42 AM MRN: [id number] Date of Birth: July 02, 1970 Attending MD: Katrinka Blazing , , [id number] CSN: [id number] Age: 52 Admit Type: Outpatient Procedure:                Upper GI endoscopy Indications:              Dysphagia, Abnormal cine-esophagram Providers:                Katrinka Blazing, Buel Ream. Thomasena Edis RN, RN, Pandora Leiter, Technician, Lennice Sites Technician,                            Technician Referring MD:              Medicines:                Monitored Anesthesia Care Complications:            No immediate complications. Estimated Blood Loss:     Estimated blood loss: none. Procedure:                Pre-Anesthesia Assessment:                           - Prior to the procedure, a History and Physical                            was performed, and patient medications, allergies                            and sensitivities were reviewed. The patient's                            tolerance of previous anesthesia was reviewed.                           - The risks and benefits of the procedure and the                            sedation options and risks were discussed with the                            patient. All questions were answered and informed                            consent was obtained.                           - ASA Grade Assessment: II - A patient with mild                            systemic disease.                           After obtaining informed consent, the endoscope was  passed under direct vision. Throughout the                            procedure, the patient's blood pressure, pulse, and                            oxygen saturations were monitored continuously. The                            GIF-H190 ([id number]) scope was introduced through the                            mouth, and advanced to the second part of duodenum.                             The upper GI endoscopy was accomplished without                            difficulty. The patient tolerated the procedure                            well. Scope In: 9:57:12 AM Scope Out: 10:04:37 AM Total Procedure Duration: 0 hours 7 minutes 25 seconds  Findings:      No endoscopic abnormality was evident in the esophagus to explain the       patient's complaint of dysphagia. It was decided, however, to proceed       with dilation of the entire esophagus. A guidewire was placed and the       scope was withdrawn. Dilation was performed with a Savary dilator with       mild resistance at 18 mm. The dilation site was examined following       endoscope reinsertion and showed mild mucosal disruption.      The gastroesophageal flap valve was visualized endoscopically and       classified as Hill Grade II (fold present, opens with respiration).      A small amount of food (residue) was found in the gastric body. Removal       was accomplished with a Lucina Mellow net prior to proceeeding with dilation to       proceed safely with the intervention.      The examined duodenum was normal. Impression:               - No endoscopic esophageal abnormality to explain                            patient's dysphagia. Esophagus dilated. Dilated.                           - Gastroesophageal flap valve classified as Hill                            Grade II (fold present, opens with respiration).                           - A small amount of food (residue) in the stomach.  Removal was successful.                           - Normal examined duodenum. Moderate Sedation:      Per Anesthesia Care Recommendation:           - Discharge patient to home (ambulatory).                           - Resume previous diet.                           - If persistent dysphagia, will repeat dilation                            with Savary 20 mm. Procedure Code(s):        ---  Professional ---                           [id number], Esophagogastroduodenoscopy, flexible,                            transoral; with removal of foreign body(s)                           43248, Esophagogastroduodenoscopy, flexible,                            transoral; with insertion of guide wire followed by                            passage of dilator(s) through esophagus over guide                            wire Diagnosis Code(s):        --- Professional ---                           R13.10, Dysphagia, unspecified                           T18.2XXA, Foreign body in stomach, initial encounter                           R93.3, Abnormal findings on diagnostic imaging of                            other parts of digestive tract CPT copyright 2022 American Medical Association. All rights reserved. The codes documented in this report are preliminary and upon coder review may  be revised to meet current compliance requirements. Katrinka Blazing, MD Katrinka Blazing,  10/22/2022 10:11:08 AM This report has been signed electronically. Number of Addenda: 0

## 2022-10-22 NOTE — H&P (Signed)
Ruth Duncan is an 52 y.o. female.   Chief Complaint: Dysphagia and abnormal esophagram HPI: 52 year old female with past medical history of GERD, bipolar disorder, diverticulitis, history of ovarian cancer, who is coming for evaluation of dysphagia and abnormal esophagram.  Patient has presented chronic dysphagia that has required multiple dilations of Schatzki's ring.  Has persisted with dysphagia.  Last esophagram performed on 08/20/2022 showed possible stenosis in the upper thoracic esophagus.  Past Medical History:  Diagnosis Date   Bipolar disorder (HCC)    Chronic abdominal pain    hx of IBS-D, ? bile salt-induced diarrhea   Chronic eczema of hand 2012   BILATERAL ON MTX SINCE 2014   Complication of anesthesia    Diverticulitis    GERD (gastroesophageal reflux disease)    History of kidney stones    Ovarian cancer (HCC) 2010   PONV (postoperative nausea and vomiting)    Surgical menopause 2010   Tubular adenoma 2010    Past Surgical History:  Procedure Laterality Date   ABDOMINAL HYSTERECTOMY  01/05/2008   APPENDECTOMY  01/04/2002   BACTERIAL OVERGROWTH TEST N/A 09/01/2015   Procedure: BACTERIAL OVERGROWTH TEST;  Surgeon: West Bali, MD;  [facility];  Service: Endoscopy;  Laterality: N/A;  700   BALLOON DILATION N/A 03/31/2020   Procedure: BALLOON DILATION;  Surgeon: Lanelle Bal, DO;  [facility]: [facility];  Service: Endoscopy;  Laterality: N/A;   BALLOON DILATION N/A 04/16/2022   Procedure: BALLOON DILATION;  Surgeon: Lanelle Bal, DO;  [facility]: [facility];  Service: Endoscopy;  Laterality: N/A;   CESAREAN SECTION  01/05/1996   COLONOSCOPY  12/04/2008   tubular adenomas, negative microscopic colitis. Due for Flex Sig Dec 2015   ESOPHAGOGASTRODUODENOSCOPY  05/06/2008   [id number] esophagus without evidence of Barrett's, mass, erosion, ulceration or stricture/ Hiatal hernia/ Normal duodenal bulb and second portion of the duodenum    ESOPHAGOGASTRODUODENOSCOPY N/A 10/04/2014   Dr. Darrick Penna: patent stricture at GE junction, no dilation, small hiatal hernia, mild non-erosive gastritis (benign), multiple small gastric polyps, benign. Capsule study recommended but never completed    ESOPHAGOGASTRODUODENOSCOPY (EGD) WITH PROPOFOL N/A 04/10/2019   Procedure: ESOPHAGOGASTRODUODENOSCOPY (EGD) WITH PROPOFOL;  Surgeon: West Bali, MD;  [facility];  Service: Endoscopy;  Laterality: N/A;  1:15pm-moved to 4/6 @ 2:45pm   ESOPHAGOGASTRODUODENOSCOPY (EGD) WITH PROPOFOL N/A 03/31/2020   Surgeon: Lanelle Bal, DO; small hiatal hernia, Schatzki's ring dilated, gastritis biopsied, normal examined duodenum.  Biopsies negative for H. pylori.   ESOPHAGOGASTRODUODENOSCOPY (EGD) WITH PROPOFOL N/A 04/16/2022   Procedure: ESOPHAGOGASTRODUODENOSCOPY (EGD) WITH PROPOFOL;  Surgeon: Lanelle Bal, DO;  [facility];  Service: Endoscopy;  Laterality: N/A;  900am, asa 2   FLEXIBLE SIGMOIDOSCOPY N/A 02/18/2014   SLF: Normal small bowel. formed stool at anastomosis. Stool cleared with irrigation. One superfical erosion at the anastomosis. otherwise normal. normal rectal mucosa   SAVORY DILATION N/A 04/10/2019   Procedure: SAVORY DILATION;  Surgeon: West Bali, MD;  [facility]: [facility];  Service: Endoscopy;  Laterality: N/A;   SUBTOTAL COLECTOMY  01/04/2009   TUBAL LIGATION      Family History  Problem Relation Age of Onset   Alcohol abuse Paternal Grandfather    Hypertension Mother    Kidney disease Father    Hypertension Father    Coronary artery disease Father    Breast cancer Paternal Grandmother        post menopausal   Colon cancer  Neg Hx    Social History:  reports that she quit smoking about 9 years ago. Her smoking use included cigarettes. She started smoking about 39 years ago. She has a 30 pack-year smoking history. She has never used smokeless tobacco. She reports that she does not drink alcohol and  does not use drugs.  Allergies: No Known Allergies  Medications Prior to Admission  Medication Sig Dispense Refill   atomoxetine (STRATTERA) 40 MG capsule TAKE 1 CAPSULE BY MOUTH EVERY DAY 30 capsule 2   buPROPion (WELLBUTRIN XL) 150 MG 24 hr tablet Take 1 tablet (150 mg total) by mouth daily. 30 tablet 2   dexlansoprazole (DEXILANT) 60 MG capsule Take 1 capsule (60 mg total) by mouth daily. 90 capsule 3   dicyclomine (BENTYL) 10 MG capsule TAKE 1 CAPSULE (10 MG TOTAL) BY MOUTH 3 (THREE) TIMES DAILY BEFORE MEALS. AS NEEDED 270 capsule 1   doxepin (SINEQUAN) 10 MG capsule Take 1 capsule (10 mg total) by mouth at bedtime as needed (sleep). 30 capsule 0   estradiol (ESTRACE) 0.1 MG/GM vaginal cream Place 1 g vaginally at bedtime.     lithium 300 MG tablet TAKE 2 TABLET IN AM, 2 TABLET AT NOON AND 2 TABLET IN EVENING 180 tablet 2   rosuvastatin (CRESTOR) 20 MG tablet Take 1 tablet (20 mg total) by mouth daily. 90 tablet 3   valACYclovir (VALTREX) 500 MG tablet Take 500 mg by mouth daily as needed (outbreaks).     ALPRAZolam (XANAX) 0.25 MG tablet Take 1 tablet (0.25 mg total) by mouth daily as needed. for anxiety 20 tablet 0   nitrofurantoin (MACRODANTIN) 50 MG capsule Take 50 mg by mouth daily as needed (UTI).  5   nitroGLYCERIN (NITROSTAT) 0.4 MG SL tablet Place 0.4 mg under the tongue every 5 (five) minutes as needed for chest pain.     ondansetron (ZOFRAN) 4 MG tablet Take 1 tablet (4 mg total) by mouth every 6 (six) hours as needed for nausea or vomiting. 30 tablet 0    No results found for this or any previous visit (from the past 48 hour(s)). No results found.  Review of Systems  All other systems reviewed and are negative.   Blood pressure 112/72, pulse 69, temperature 97.7 F (36.5 C), temperature source Oral, resp. rate 12, height 5\' 3"  (1.6 m), weight 54.4 kg, SpO2 99%. Physical Exam  GENERAL: The patient is AO x3, in no acute distress. HEENT: Head is normocephalic and  atraumatic. EOMI are intact. Mouth is well hydrated and without lesions. NECK: Supple. No masses LUNGS: Clear to auscultation. No presence of rhonchi/wheezing/rales. Adequate chest expansion HEART: RRR, normal s1 and s2. ABDOMEN: Soft, nontender, no guarding, no peritoneal signs, and nondistended. BS +. No masses. EXTREMITIES: Without any cyanosis, clubbing, rash, lesions or edema. NEUROLOGIC: AOx3, no focal motor deficit. SKIN: no jaundice, no rashes  Assessment/Plan 52 year old female with past medical history of GERD, bipolar disorder, diverticulitis, history of ovarian cancer, who is coming for evaluation of dysphagia and abnormal esophagram.  We will proceed with EGD.  Dolores Frame, MD 10/22/2022, 9:08 AM

## 2022-10-22 NOTE — Transfer of Care (Signed)
Immediate Anesthesia Transfer of Care Note  Patient: Ruth Duncan  Procedure(s) Performed: ESOPHAGOGASTRODUODENOSCOPY (EGD) WITH PROPOFOL SAVORY DILATION  Patient Location: Short Stay  Anesthesia Type:General  Level of Consciousness: awake, alert , oriented, and patient cooperative  Airway & Oxygen Therapy: Patient Spontanous Breathing  Post-op Assessment: Report given to RN, Post -op Vital signs reviewed and stable, and Patient moving all extremities X 4  Post vital signs: Reviewed and stable  Last Vitals:  Vitals Value Taken Time  BP 107/56 10/22/22 1013  Temp 36.5 C 10/22/22 1010  Pulse 77 10/22/22 1013  Resp 19 10/22/22 1013  SpO2 97 % 10/22/22 1013    Last Pain:  Vitals:   10/22/22 1010  TempSrc: Axillary  PainSc: 0-No pain         Complications: No notable events documented.

## 2022-10-22 NOTE — Anesthesia Postprocedure Evaluation (Signed)
Anesthesia Post Note  Patient: Ruth Duncan  Procedure(s) Performed: ESOPHAGOGASTRODUODENOSCOPY (EGD) WITH PROPOFOL SAVORY DILATION  Patient location during evaluation: Phase II Anesthesia Type: General Level of consciousness: awake Pain management: pain level controlled Vital Signs Assessment: post-procedure vital signs reviewed and stable Respiratory status: spontaneous breathing and respiratory function stable Cardiovascular status: blood pressure returned to baseline and stable Postop Assessment: no headache and no apparent nausea or vomiting Anesthetic complications: no Comments: Late entry   No notable events documented.   Last Vitals:  Vitals:   10/22/22 1013 10/22/22 1023  BP: (!) 107/56 110/61  Pulse: 77 81  Resp: 19 18  Temp:    SpO2: 97% 99%    Last Pain:  Vitals:   10/22/22 1023  TempSrc:   PainSc: 0-No pain                 Windell Norfolk

## 2022-10-27 ENCOUNTER — Ambulatory Visit: Payer: 59 | Attending: Internal Medicine | Admitting: Internal Medicine

## 2022-10-27 ENCOUNTER — Encounter: Payer: Self-pay | Admitting: Internal Medicine

## 2022-10-27 VITALS — BP 90/60 | HR 69 | Ht 63.0 in | Wt 118.4 lb

## 2022-10-27 DIAGNOSIS — R072 Precordial pain: Secondary | ICD-10-CM | POA: Diagnosis not present

## 2022-10-27 DIAGNOSIS — I25110 Atherosclerotic heart disease of native coronary artery with unstable angina pectoris: Secondary | ICD-10-CM

## 2022-10-27 DIAGNOSIS — Z8249 Family history of ischemic heart disease and other diseases of the circulatory system: Secondary | ICD-10-CM | POA: Diagnosis not present

## 2022-10-27 DIAGNOSIS — R079 Chest pain, unspecified: Secondary | ICD-10-CM
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
## 2022-10-27 NOTE — Patient Instructions (Signed)
Medication Instructions:  Your physician recommends that you continue on your current medications as directed. Please refer to the Current Medication list given to you today.  *If you need a refill on your cardiac medications before your next appointment, please call your pharmacy*   Lab Work: IN November: FLP, ALT, LPA, CRP (nothing to eat or drink 12 hours prior except water)  If you have labs (blood work) drawn today and your tests are completely normal, you will receive your results only by: MyChart Message (if you have MyChart) OR A paper copy in the mail If you have any lab test that is abnormal or we need to change your treatment, we will call you to review the results.   Testing/Procedures: NONE   Follow-Up: At [facility], you and your health needs are our priority.  As part of our continuing mission to provide you with exceptional heart care, we have created designated Provider Care Teams.  These Care Teams include your primary Cardiologist (physician) and Advanced Practice Providers (APPs -  Physician Assistants and Nurse Practitioners) who all work together to provide you with the care you need, when you need it.  We recommend signing up for the patient portal called "MyChart".  Sign up information is provided on this After Visit Summary.  MyChart is used to connect with patients for Virtual Visits (Telemedicine).  Patients are able to view lab/test results, encounter notes, upcoming appointments, etc.  Non-urgent messages can be sent to your provider as well.   To learn more about what you can do with MyChart, go to [email or web address].    Your next appointment:   1 year(s)  Provider:   Riley Lam, MD

## 2022-11-02 ENCOUNTER — Encounter (HOSPITAL_COMMUNITY): Payer: Self-pay | Admitting: Gastroenterology

## 2022-12-10 LAB — LIPID PANEL
Chol/HDL Ratio: 3 {ratio} (ref 0.0–4.4)
Cholesterol, Total: 181 mg/dL (ref 100–199)
HDL: 61 mg/dL (ref >39)
LDL Chol Calc (NIH): 105 mg/dL — ABNORMAL HIGH (ref 0–99)
Triglycerides: 80 mg/dL (ref 0–149)
VLDL Cholesterol Cal: 15 mg/dL (ref 5–40)

## 2022-12-10 LAB — HIGH SENSITIVITY CRP: CRP, High Sensitivity: 0.27 mg/L (ref 0.00–3.00)

## 2022-12-10 LAB — LIPOPROTEIN A (LPA): Lipoprotein (a): 14.9 nmol/L (ref <75.0)

## 2022-12-10 LAB — ALT: ALT: 13 [IU]/L (ref 0–32)

## 2022-12-13 ENCOUNTER — Telehealth: Payer: Self-pay | Admitting: Internal Medicine

## 2022-12-13 DIAGNOSIS — E782 Mixed hyperlipidemia: Secondary | ICD-10-CM

## 2022-12-13 NOTE — Telephone Encounter (Signed)
Called pt advised of MD response: We can trial zetia instead or offer lipid clinic   Pt reports currently has muscle aches taking rosuvastatin 20 mg so would prefer not to be on a statin.  Advised her will place order for pharmacy clinic and a scheduler will call to set up OV.

## 2022-12-13 NOTE — Telephone Encounter (Signed)
The patient has been notified of the result and verbalized understanding.  All questions (if any) were answered. Nero Sawatzky N Shakeisha Horine, RN 12/13/2022 1:57 PM   Pt reports has muscle ache taking rosuvastatin 20 mg PO every day.  Does not think can tolerate increase to 40 mg every day.  Advised will send concern to MD to address.

## 2022-12-13 NOTE — Telephone Encounter (Signed)
Patient returned RN's call regarding results. 

## 2022-12-13 NOTE — Telephone Encounter (Signed)
-----   Message from Christell Constant sent at 12/11/2022 10:13 AM EST ----- Results: Tgs have improved LDL above goal Plan: Increase to rosuvastatin 40 mg and labs in 3 months  Christell Constant, MD

## 2022-12-20 ENCOUNTER — Other Ambulatory Visit: Payer: Self-pay | Admitting: Physician Assistant

## 2022-12-20 ENCOUNTER — Other Ambulatory Visit (HOSPITAL_COMMUNITY): Payer: Self-pay | Admitting: Psychiatry

## 2022-12-20 DIAGNOSIS — F250 Schizoaffective disorder, bipolar type: Secondary | ICD-10-CM

## 2022-12-20 DIAGNOSIS — Z1231 Encounter for screening mammogram for malignant neoplasm of breast: Secondary | ICD-10-CM

## 2022-12-20 DIAGNOSIS — F900 Attention-deficit hyperactivity disorder, predominantly inattentive type: Secondary | ICD-10-CM

## 2022-12-27 ENCOUNTER — Encounter (HOSPITAL_COMMUNITY): Payer: Self-pay | Admitting: Psychiatry

## 2022-12-27 ENCOUNTER — Other Ambulatory Visit (HOSPITAL_COMMUNITY): Payer: Self-pay | Admitting: *Deleted

## 2022-12-27 ENCOUNTER — Telehealth (HOSPITAL_BASED_OUTPATIENT_CLINIC_OR_DEPARTMENT_OTHER): Payer: 59 | Admitting: Psychiatry

## 2022-12-27 DIAGNOSIS — F900 Attention-deficit hyperactivity disorder, predominantly inattentive type: Secondary | ICD-10-CM | POA: Diagnosis not present

## 2022-12-27 DIAGNOSIS — F250 Schizoaffective disorder, bipolar type: Secondary | ICD-10-CM | POA: Diagnosis not present

## 2022-12-27 DIAGNOSIS — F410 Panic disorder [episodic paroxysmal anxiety] without agoraphobia: Secondary | ICD-10-CM

## 2022-12-27 DIAGNOSIS — Z5181 Encounter for therapeutic drug level monitoring: Secondary | ICD-10-CM

## 2022-12-27 MED ORDER — BUPROPION HCL ER (XL) 150 MG PO TB24
150.0000 mg | ORAL_TABLET | Freq: Every day | ORAL | 2 refills | Status: DC
Start: 2022-12-27 — End: 2023-02-28

## 2022-12-27 MED ORDER — LITHIUM CARBONATE 300 MG PO TABS
ORAL_TABLET | ORAL | 2 refills | Status: DC
Start: 2022-12-27 — End: 2023-02-28

## 2022-12-27 MED ORDER — DOXEPIN HCL 10 MG PO CAPS
10.0000 mg | ORAL_CAPSULE | Freq: Every day | ORAL | 2 refills | Status: DC
Start: 2022-12-27 — End: 2023-02-28

## 2022-12-27 MED ORDER — ATOMOXETINE HCL 40 MG PO CAPS
ORAL_CAPSULE | ORAL | 2 refills | Status: DC
Start: 2022-12-27 — End: 2023-02-28

## 2022-12-27 NOTE — Progress Notes (Signed)
[facility] MD Virtual Progress Note   Patient Location: Home Provider Location: Home Office  I connect with patient by telephone and verified that I am speaking with correct person by using two identifiers. I discussed the limitations of evaluation and management by telemedicine and the availability of in person appointments. I also discussed with the patient that there may be a patient responsible charge related to this service. The patient expressed understanding and agreed to proceed.  Ruth Duncan [id number] 52 y.o.  12/27/2022 2:27 PM  History of Present Illness:  Patient is evaluated by video section however her, did not work and it was converted phone session.  She reported a lot of anxiety, depression, crying spells and not sleeping well.  She had a lot of stress since the last visit.  Her son Arron required back-to-back to major abdominal surgery and he may need another surgery.  She is taking care of her son.  She is in touch with her ex-husband however now he had bought a new house and moved in with his new girlfriend.  She admits sometimes feel tearful, sad when she see her husband is doing well and she is still struggling taking care of the family.  On further questioning of the medication I do notice she is not taking doxepin because she was out of the refills.  Patient did not realize that she was without the doxepin for more than a month.  She is taking lithium, Lamictal and Strattera which is helping her attention and focus.  She denies any hallucination, paranoia, suicidal thoughts but endorsed a lot of rumination and crying spells.  She does not stay asleep all night.  During the conversation she appears emotional.  She reported usually at this time of the year she enjoys but lately does not feel as such.  Even though she has decorated her house but does not feel enjoyment.  She goes to work every day.  She denies drinking or using any illegal substances.  She lost a  few pounds and she is happy about it.  She denies any mania, psychosis.  Her paranoia and hallucinations are almost not exist.    Past Psychiatric History: H/O alcohol, cocaine and inpatient at [facility] in 2007 and rehabilitation at [facility] however things are now slowly and gradually tried Strattera, Prozac, Trilafon, Abilify, Lamictal, Latuda and Trazadone. No h/o suicidal attempt.  H/O trouble with the law later probation. H/O paranoia, hallucination, anxiety.    Outpatient Encounter Medications as of 12/27/2022  Medication Sig   ALPRAZolam (XANAX) 0.25 MG tablet Take 1 tablet (0.25 mg total) by mouth daily as needed. for anxiety   atomoxetine (STRATTERA) 40 MG capsule TAKE 1 CAPSULE BY MOUTH EVERY DAY   buPROPion (WELLBUTRIN XL) 150 MG 24 hr tablet Take 1 tablet (150 mg total) by mouth daily.   dexlansoprazole (DEXILANT) 60 MG capsule Take 1 capsule (60 mg total) by mouth daily.   dicyclomine (BENTYL) 10 MG capsule TAKE 1 CAPSULE (10 MG TOTAL) BY MOUTH 3 (THREE) TIMES DAILY BEFORE MEALS. AS NEEDED   doxepin (SINEQUAN) 10 MG capsule Take 1 capsule (10 mg total) by mouth at bedtime as needed (sleep).   estradiol (ESTRACE) 0.1 MG/GM vaginal cream Place 1 g vaginally at bedtime.   lithium 300 MG tablet TAKE 2 TABLET IN AM, 2 TABLET AT NOON AND 2 TABLET IN EVENING   nitrofurantoin (MACRODANTIN) 50 MG capsule Take 50 mg by mouth daily as needed (UTI).   nitroGLYCERIN (NITROSTAT)  0.4 MG SL tablet Place 0.4 mg under the tongue every 5 (five) minutes as needed for chest pain.   ondansetron (ZOFRAN) 4 MG tablet Take 1 tablet (4 mg total) by mouth every 6 (six) hours as needed for nausea or vomiting.   rosuvastatin (CRESTOR) 20 MG tablet Take 1 tablet (20 mg total) by mouth daily.   SUMAtriptan (IMITREX) 25 MG tablet daily at 6 (six) AM.   valACYclovir (VALTREX) 500 MG tablet Take 500 mg by mouth daily as needed (outbreaks).   No facility-administered encounter medications on file as of 12/27/2022.     Recent Results (from the past 2160 hours)  High sensitivity CRP     Status: None   Collection Time: 12/09/22 10:24 AM  Result Value Ref Range   CRP, High Sensitivity 0.27 0.00 - 3.00 mg/L    Comment:          Relative Risk for Future Cardiovascular Event                              Low                 <1.00                              Average       1.00 - 3.00                              High                >3.00   Lipoprotein A (LPA)     Status: None   Collection Time: 12/09/22 10:24 AM  Result Value Ref Range   Lipoprotein (a) 14.9 <75.0 nmol/L    Comment: Note:  Values greater than or equal to 75.0 nmol/L may        indicate an independent risk factor for CHD,        but must be evaluated with caution when applied        to non-Caucasian populations due to the        influence of genetic factors on Lp(a) across        ethnicities.   Lipid panel     Status: Abnormal   Collection Time: 12/09/22 10:24 AM  Result Value Ref Range   Cholesterol, Total 181 100 - 199 mg/dL   Triglycerides 80 0 - 149 mg/dL   HDL 61 >63 mg/dL   VLDL Cholesterol Cal 15 5 - 40 mg/dL   LDL Chol Calc (NIH) 875 (H) 0 - 99 mg/dL   Chol/HDL Ratio 3.0 0.0 - 4.4 ratio    Comment:                                   T. Chol/HDL Ratio                                             Men  Women                               1/2 Avg.Risk  3.4  3.3                                   Avg.Risk  5.0    4.4                                2X Avg.Risk  9.6    7.1                                3X Avg.Risk 23.4   11.0   ALT     Status: None   Collection Time: 12/09/22 10:24 AM  Result Value Ref Range   ALT 13 0 - 32 IU/L     Psychiatric Specialty Exam: Physical Exam  Review of Systems  Weight 118 lb (53.5 kg).There is no height or weight on file to calculate BMI.  General Appearance: NA  Eye Contact:  NA  Speech:  Normal Rate  Volume:  Normal  Mood:  Anxious and Depressed  Affect:  NA  Thought Process:   Descriptions of Associations: Intact  Orientation:  Full (Time, Place, and Person)  Thought Content:  Rumination  Suicidal Thoughts:  No  Homicidal Thoughts:  No  Memory:  Immediate;   Good Recent;   Good Remote;   Good  Judgement:  Intact  Insight:  Present  Psychomotor Activity:  NA  Concentration:  Concentration: Good and Attention Span: Good  Recall:  Good  Fund of Knowledge:  Good  Language:  Good  Akathisia:  No  Handed:  Right  AIMS (if indicated):     Assets:  Communication Skills Desire for Improvement Housing Resilience Transportation  ADL's:  Intact  Cognition:  WNL  Sleep:  fair     Assessment/Plan: Attention deficit hyperactivity disorder (ADHD), predominantly inattentive type - Plan: atomoxetine (STRATTERA) 40 MG capsule  Schizoaffective disorder, bipolar type (HCC) - Plan: buPROPion (WELLBUTRIN XL) 150 MG 24 hr tablet, doxepin (SINEQUAN) 10 MG capsule, lithium 300 MG tablet  Panic attack - Plan: doxepin (SINEQUAN) 10 MG capsule  I reviewed current medication, blood work.  Her labs are much better than before.  She is overall doing well however not taking the doxepin causing more emotions, poor sleep, racing thoughts.  I encourage to remember taking the doxepin at bedtime along with other medication.  And if her condition remain the same then we will consider medication adjustment.  She agreed with the plan.  For now continue Strattera 40 mg daily, Wellbutrin XL 150 mg daily, lithium 600 mg 3 times a day and Sinequan 10 mg at bedtime.  We will order lithium level.  Recommend to call us back if any question or concern.  Follow-up in 3 months.   Follow Up Instructions:     I discussed the assessment and treatment plan with the patient. The patient was provided an opportunity to ask questions and all were answered. The patient agreed with the plan and demonstrated an understanding of the instructions.   The patient was advised to call back or seek an in-person  evaluation if the symptoms worsen or if the condition fails to improve as anticipated.    Collaboration of Care: Other provider involved in patient's care AEB notes are available in epic to review  Patient/Guardian was advised Release of Information must be obtained prior to  any record release in order to collaborate their care with an outside provider. Patient/Guardian was advised if they have not already done so to contact the registration department to sign all necessary forms in order for [place] to release information regarding their care.   Consent: Patient/Guardian gives verbal consent for treatment and assignment of benefits for services provided during this visit. Patient/Guardian expressed understanding and agreed to proceed.     I provided 25 minutes of non face to face time during this encounter.  Note: This document was prepared by [employer] voice dictation technology and any errors that results from this process are unintentional.    Cleotis Nipper, MD 12/27/2022

## 2023-01-25 ENCOUNTER — Encounter: Payer: Self-pay | Admitting: Student

## 2023-01-25 ENCOUNTER — Ambulatory Visit: Payer: 59 | Attending: Student | Admitting: Student

## 2023-01-25 ENCOUNTER — Other Ambulatory Visit (HOSPITAL_COMMUNITY): Payer: Self-pay | Admitting: Psychiatry

## 2023-01-25 DIAGNOSIS — F250 Schizoaffective disorder, bipolar type: Secondary | ICD-10-CM

## 2023-01-25 DIAGNOSIS — E782 Mixed hyperlipidemia: Secondary | ICD-10-CM

## 2023-01-25 MED ORDER — ROSUVASTATIN CALCIUM 10 MG PO TABS: 10.0000 mg | ORAL_TABLET | Freq: Every day | ORAL | 3 refills | Status: AC

## 2023-01-25 NOTE — Patient Instructions (Signed)
Your Results:             Your most recent labs Goal  Total Cholesterol 181  < 200  Triglycerides 80 < 150  HDL (happy/good cholesterol) 61 > 40  LDL (lousy/bad cholesterol 105 < 55   Medication changes: Start taking rosuvastatin 10 mg daily if not tolerated well  reduce dose to 5 mg daily or 5 mg every other day. We will start the process to get PCSK9i (Repatha or Praluent)  covered by your insurance.  Once the prior authorization is complete, we will call you to let you know and confirm pharmacy information.     Praluent is a cholesterol medication that improved your body's ability to get rid of "bad cholesterol" known as LDL. It can lower your LDL up to 60%. It is an injection that is given under the skin every 2 weeks. The most common side effects of Praluent include runny nose, symptoms of the common cold, rarely flu or flu-like symptoms, back/muscle pain in about 3-4% of the patients, and redness, pain, or bruising at the injection site.    Repatha is a cholesterol medication that improved your body's ability to get rid of "bad cholesterol" known as LDL. It can lower your LDL up to 60%! It is an injection that is given under the skin every 2 weeks. The medication often requires a prior authorization from your insurance company.  The most common side effects of Repatha include runny nose, symptoms of the common cold, rarely flu or flu-like symptoms, back/muscle pain in about 3-4% of the patients, and redness, pain, or bruising at the injection site.   Lab orders: We want to repeat labs after 2-3 months.  We will send you a lab order to remind you once we get closer to that time.

## 2023-01-25 NOTE — Assessment & Plan Note (Signed)
Assessment:  LDL goal: < 55 mg/dl last LDLc 782 mg/dl (95/62)ZHYQM on high intensity statin Currently not on any LDLc lowering medication(s) Intolerance to high intensity statins- Crestor 20 mg daily   Discussed risk factors, LDLc, TC and TG goals next potential options (, low intensity statins, ezetimibe PCSK-9 inhibitors, bempedoic acid and inclisiran); cost, dosing efficacy, side effects  Follows healthy diet and exercises 5 days a week   Plan: Start taking Crestor 10 gm daily if not tolerated reduce dose to 5 mg daily or every other day  Will apply for PA for PCSK9i; will inform patient upon approval  Lipid lab due in 2-3 months after starting [facility]

## 2023-01-25 NOTE — Progress Notes (Signed)
Patient ID: SYNAI MAGNER                 DOB: [birth year]                    MRN: [id number]      HPI: Ruth Duncan is a 53 y.o. female patient referred to lipid clinic by Dr.Chandrasekhar. PMH is significant for CAD, Hypertriglyceridemia.former polysubstance abuse (alcohol, illicit's, tobacco) family hx of  ASCVD.  Last Lab for lipid done Dec 2024 LDLc was 105. Patient was advised to increase Crestor dose from 20 mg to 40 mg daily however patient reported she could not tolerate statin due to myalgia so wanted to stop taking Crestor. Patient was referred to lipid clinic  Patient presented today for lipid clinic. Reports she could not tolerate Crestor 20 mg due to muscle and joint pain so she has stopped taking it. 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  Reviewed options for lowering LDL cholesterol, including ezetimibe, PCSK-9 inhibitors, bempedoic acid and inclisiran.  Discussed mechanisms of action, dosing, side effects and potential decreases in LDL cholesterol.  Also reviewed cost information and potential options for patient assistance.  Current Medications: none  Intolerances: Crestor 20 mg daily  Risk Factors: Premature CAD, and HLD family hx of CAD and HLD,  LDL goal: <55 mg/dl   Diet: eat healthy home cooked meal.   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  Family History:  Father - HLD, elevated TG and died from MI in his 10's  Mother- double valve replacement, cholesterol  Paternal GF : MI in his late 26's  Social History:  Alcohol: none  Smoking: none   Labs: Lipid Panel     Component Value Date/Time   CHOL 181 12/09/2022 1024   TRIG 80 12/09/2022 1024   HDL 61 12/09/2022 1024   CHOLHDL 3.0 12/09/2022 1024   LDLCALC 105 (H) 12/09/2022 1024   LDLDIRECT 111 (H) 04/12/2022 1615   LABVLDL 15 12/09/2022 1024    Past Medical  History:  Diagnosis Date   Bipolar disorder (HCC)    Chronic abdominal pain    hx of IBS-D, ? bile salt-induced diarrhea   Chronic eczema of hand 2012   BILATERAL ON MTX SINCE 2014   Complication of anesthesia    Diverticulitis    GERD (gastroesophageal reflux disease)    History of kidney stones    Ovarian cancer (HCC) 2010   PONV (postoperative nausea and vomiting)    Surgical menopause 2010   Tubular adenoma 2010    Current Outpatient Medications on File Prior to Visit  Medication Sig Dispense Refill   ALPRAZolam (XANAX) 0.25 MG tablet Take 1 tablet (0.25 mg total) by mouth daily as needed. for anxiety 20 tablet 0   atomoxetine (STRATTERA) 40 MG capsule TAKE 1 CAPSULE BY MOUTH EVERY DAY 30 capsule 2   buPROPion (WELLBUTRIN XL) 150 MG 24 hr tablet Take 1 tablet (150 mg total) by mouth daily. 30 tablet 2   dexlansoprazole (DEXILANT) 60 MG capsule Take 1 capsule (60 mg total) by mouth daily. 90 capsule 3   dicyclomine (BENTYL) 10 MG capsule TAKE 1 CAPSULE (10 MG TOTAL) BY MOUTH 3 (THREE) TIMES DAILY BEFORE MEALS. AS NEEDED 270 capsule 1   doxepin (SINEQUAN) 10 MG capsule Take 1 capsule (10 mg  total) by mouth at bedtime. 30 capsule 2   estradiol (ESTRACE) 0.1 MG/GM vaginal cream Place 1 g vaginally at bedtime.     lithium 300 MG tablet TAKE 2 TABLET IN AM, 2 TABLET AT NOON AND 2 TABLET IN EVENING 180 tablet 2   nitrofurantoin (MACRODANTIN) 50 MG capsule Take 50 mg by mouth daily as needed (UTI).  5   nitroGLYCERIN (NITROSTAT) 0.4 MG SL tablet Place 0.4 mg under the tongue every 5 (five) minutes as needed for chest pain.     ondansetron (ZOFRAN) 4 MG tablet Take 1 tablet (4 mg total) by mouth every 6 (six) hours as needed for nausea or vomiting. 30 tablet 0   SUMAtriptan (IMITREX) 25 MG tablet daily at 6 (six) AM.     valACYclovir (VALTREX) 500 MG tablet Take 500 mg by mouth daily as needed (outbreaks).     No current facility-administered medications on file prior to visit.    No  Known Allergies  Assessment/Plan:  1. Hyperlipidemia -  Problem  Mixed Hyperlipidemia   Current Medications: none  Intolerances: Crestor 20 mg daily  Risk Factors: Premature CAD, and HLD family hx of CAD and HLD,  LDL goal: <55 mg/dl     Mixed hyperlipidemia Assessment:  LDL goal: < 55 mg/dl last LDLc 782 mg/dl (95/62)ZHYQM on high intensity statin Currently not on any LDLc lowering medication(s) Intolerance to high intensity statins- Crestor 20 mg daily   Discussed risk factors, LDLc, TC and TG goals next potential options (, low intensity statins, ezetimibe PCSK-9 inhibitors, bempedoic acid and inclisiran); cost, dosing efficacy, side effects  Follows healthy diet and exercises 5 days a week   Plan: Start taking Crestor 10 gm daily if not tolerated reduce dose to 5 mg daily or every other day  Will apply for PA for PCSK9i; will inform patient upon approval  Lipid lab due in 2-3 months after starting PCSK9i    Thank you,  Carmela Hurt, Pharm.D  [facility] [address] [place], [place] [address]  Phone: [phone]; Fax: [phone]

## 2023-01-27 ENCOUNTER — Ambulatory Visit
Admission: RE | Admit: 2023-01-27 | Discharge: 2023-01-27 | Disposition: A | Discharge status: Home / Self Care | Payer: 59 | Source: Ambulatory Visit | Attending: Physician Assistant | Admitting: Physician Assistant

## 2023-01-27 DIAGNOSIS — Z1231 Encounter for screening mammogram for malignant neoplasm of breast: Secondary | ICD-10-CM

## 2023-02-09 ENCOUNTER — Other Ambulatory Visit (HOSPITAL_COMMUNITY): Payer: Self-pay

## 2023-02-09 ENCOUNTER — Telehealth: Payer: Self-pay | Admitting: Pharmacy Technician

## 2023-02-09 ENCOUNTER — Encounter: Payer: Self-pay | Admitting: Internal Medicine

## 2023-02-09 DIAGNOSIS — E782 Mixed hyperlipidemia: Secondary | ICD-10-CM

## 2023-02-09 NOTE — Telephone Encounter (Signed)
 Pharmacy Patient Advocate Encounter  Received notification from [employer] that Prior Authorization for repatha  has been APPROVED from 02/09/23 to 08/09/23. Ran test claim, Copay is $0.00 - one month. This test claim was processed through [facility]- copay amounts may vary at other pharmacies due to pharmacy/plan contracts, or as the patient moves through the different stages of their insurance plan.   PA #/Case ID/Reference #: [id number]

## 2023-02-09 NOTE — Telephone Encounter (Signed)
 Pharmacy Patient Advocate Encounter   Received notification from Physician's Office that prior authorization for repatha  is required/requested.   Insurance verification completed.   The patient is insured through [facility] .   Per test claim: PA required; PA submitted to above mentioned insurance via CoverMyMeds Key/confirmation #/EOC [id number] Status is pending

## 2023-02-10 ENCOUNTER — Other Ambulatory Visit: Payer: Self-pay

## 2023-02-10 MED ORDER — REPATHA SURECLICK 140 MG/ML ~~LOC~~ SOAJ
1.0000 mL | SUBCUTANEOUS | 11 refills | Status: AC
  Filled 2023-02-10: qty 2, 28d supply, fill #0
  Filled 2023-05-18: qty 2, 28d supply, fill #1
  Filled 2023-06-09: qty 2, 28d supply, fill #2
  Filled 2023-07-07: qty 2, 28d supply, fill #3
  Filled 2023-08-04: qty 2, 28d supply, fill #4
  Filled 2023-08-29 – 2023-08-31 (×2): qty 2, 28d supply, fill #5
  Filled 2023-09-26: qty 2, 28d supply, fill #6
  Filled 2023-10-20: qty 2, 28d supply, fill #7
  Filled 2023-11-16: qty 2, 28d supply, fill #8
  Filled 2023-12-12: qty 2, 28d supply, fill #9
  Filled 2024-01-05 – 2024-01-13 (×2): qty 2, 28d supply, fill #10

## 2023-02-10 NOTE — Telephone Encounter (Signed)
 See mychart message from 2/5

## 2023-02-17 ENCOUNTER — Encounter (HOSPITAL_COMMUNITY): Payer: Self-pay

## 2023-02-17 ENCOUNTER — Telehealth: Payer: Self-pay | Admitting: Neurology

## 2023-02-17 ENCOUNTER — Ambulatory Visit: Payer: 59 | Admitting: Neurology

## 2023-02-17 NOTE — Telephone Encounter (Signed)
LVM and sent mychart msg informing pt of need to reschedule 02/17/23 appt - MD out   If pt calls back to reschedule, you can offer an EMG slot with Dr. Lucia Gaskins after 02/14/23

## 2023-02-28 ENCOUNTER — Encounter (HOSPITAL_COMMUNITY): Payer: Self-pay | Admitting: Psychiatry

## 2023-02-28 ENCOUNTER — Telehealth (HOSPITAL_BASED_OUTPATIENT_CLINIC_OR_DEPARTMENT_OTHER): Payer: 59 | Admitting: Psychiatry

## 2023-02-28 VITALS — Wt 107.5 lb

## 2023-02-28 DIAGNOSIS — F410 Panic disorder [episodic paroxysmal anxiety] without agoraphobia: Secondary | ICD-10-CM | POA: Diagnosis not present

## 2023-02-28 DIAGNOSIS — F900 Attention-deficit hyperactivity disorder, predominantly inattentive type: Secondary | ICD-10-CM

## 2023-02-28 DIAGNOSIS — F250 Schizoaffective disorder, bipolar type: Secondary | ICD-10-CM | POA: Diagnosis not present

## 2023-02-28 MED ORDER — DOXEPIN HCL 10 MG PO CAPS: 10.0000 mg | ORAL_CAPSULE | Freq: Every day | ORAL | 2 refills | Status: DC

## 2023-02-28 MED ORDER — ATOMOXETINE HCL 60 MG PO CAPS
60.0000 mg | ORAL_CAPSULE | Freq: Every day | ORAL | 2 refills | Status: AC
Start: 2023-02-28 — End: ?

## 2023-02-28 MED ORDER — LITHIUM CARBONATE 300 MG PO TABS
ORAL_TABLET | ORAL | 2 refills | Status: AC
Start: 2023-02-28 — End: ?

## 2023-02-28 MED ORDER — BUPROPION HCL ER (XL) 150 MG PO TB24
150.0000 mg | ORAL_TABLET | Freq: Every day | ORAL | 2 refills | Status: AC
Start: 2023-02-28 — End: ?
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
## 2023-03-08 ENCOUNTER — Encounter: Payer: Self-pay | Admitting: Neurology

## 2023-03-08 ENCOUNTER — Other Ambulatory Visit: Payer: Self-pay | Admitting: Neurology

## 2023-03-08 ENCOUNTER — Ambulatory Visit (INDEPENDENT_AMBULATORY_CARE_PROVIDER_SITE_OTHER): Payer: 59 | Admitting: Neurology

## 2023-03-08 VITALS — BP 108/76 | HR 69 | Ht 63.0 in | Wt 112.0 lb

## 2023-03-08 DIAGNOSIS — R51 Headache with orthostatic component, not elsewhere classified: Secondary | ICD-10-CM | POA: Diagnosis not present

## 2023-03-08 DIAGNOSIS — G43711 Chronic migraine without aura, intractable, with status migrainosus: Secondary | ICD-10-CM

## 2023-03-08 DIAGNOSIS — H539 Unspecified visual disturbance: Secondary | ICD-10-CM | POA: Diagnosis not present

## 2023-03-08 DIAGNOSIS — R519 Headache, unspecified: Secondary | ICD-10-CM

## 2023-03-08 MED ORDER — AJOVY 225 MG/1.5ML ~~LOC~~ SOAJ
225.0000 mg | SUBCUTANEOUS | Status: DC
Start: 2023-03-08 — End: 2023-03-15

## 2023-03-08 MED ORDER — ALPRAZOLAM 0.25 MG PO TABS
ORAL_TABLET | ORAL | 0 refills | Status: AC
Start: 2023-03-08 — End: ?

## 2023-03-08 MED ORDER — AJOVY 225 MG/1.5ML ~~LOC~~ SOAJ
225.0000 mg | SUBCUTANEOUS | 11 refills | Status: DC
Start: 2023-03-08 — End: 2023-03-15

## 2023-03-08 MED ORDER — FREMANEZUMAB-VFRM 225 MG/1.5ML ~~LOC~~ SOSY
225.0000 mg | PREFILLED_SYRINGE | Freq: Once | SUBCUTANEOUS | Status: DC
Start: 2023-03-08 — End: 2023-03-15

## 2023-03-08 NOTE — Patient Instructions (Signed)
 MRi of the brain w/wo contrast Start Ajovy Monthly Xanax for the MRI   Fremanezumab Injection What is this medication? FREMANEZUMAB (fre ma NEZ ue mab) prevents migraines. It works by blocking a substance in the body that causes migraines. It is a monoclonal antibody. This medicine may be used for other purposes; ask your health care provider or pharmacist if you have questions. COMMON BRAND NAME(S): AJOVY What should I tell my care team before I take this medication? They need to know if you have any of these conditions: An unusual or allergic reaction to fremanezumab, other medications, foods, dyes, or preservatives Pregnant or trying to get pregnant Breast-feeding How should I use this medication? This medication is injected under the skin. You will be taught how to prepare and give it. Take it as directed on the prescription label. Keep taking it unless your care team tells you to stop. It is important that you put your used needles and syringes in a special sharps container. Do not put them in a trash can. If you do not have a sharps container, call your pharmacist or care team to get one. Talk to your care team about the use of this medication in children. Special care may be needed. Overdosage: If you think you have taken too much of this medicine contact a poison control center or emergency room at once. NOTE: This medicine is only for you. Do not share this medicine with others. What if I miss a dose? If you miss a dose, take it as soon as you can. If it is almost time for your next dose, take only that dose. Do not take double or extra doses. What may interact with this medication? Interactions are not expected. This list may not describe all possible interactions. Give your health care provider a list of all the medicines, herbs, non-prescription drugs, or dietary supplements you use. Also tell them if you smoke, drink alcohol, or use illegal drugs. Some items may interact with  your medicine. What should I watch for while using this medication? Tell your care team if your symptoms do not start to get better or if they get worse. What side effects may I notice from receiving this medication? Side effects that you should report to your care team as soon as possible: Allergic reactions or angioedema--skin rash, itching or hives, swelling of the face, eyes, lips, tongue, arms, or legs, trouble swallowing or breathing Side effects that usually do not require medical attention (report to your care team if they continue or are bothersome): Pain, redness, or irritation at injection site This list may not describe all possible side effects. Call your doctor for medical advice about side effects. You may report side effects to FDA at 1-800-FDA-1088. Where should I keep my medication? Keep out of the reach of children and pets. Store in a refrigerator or at room temperature between 20 and 25 degrees C (68 and 77 degrees F). Refrigeration (preferred): Store in the refrigerator. Do not freeze. Keep in the original container until you are ready to take it. Remove the dose from the carton about 30 minutes before it is time for you to use it. If the dose is not used, it may be stored in the original container at room temperature for 7 days. Get rid of any unused medication after the expiration date. Room Temperature: This medication may be stored at room temperature for up to 7 days. Keep it in the original container. Protect from light until time  of use. If it is stored at room temperature, get rid of any unused medication after 7 days or after it expires, whichever is first. To get rid of medications that are no longer needed or have expired: Take the medication to a medication take-back program. Check with your pharmacy or law enforcement to find a location. If you cannot return the medication, ask your pharmacist or care team how to get rid of this medication safely. NOTE: This sheet  is a summary. It may not cover all possible information. If you have questions about this medicine, talk to your doctor, pharmacist, or health care provider.  2024 Elsevier/Gold Standard (2021-02-13 00:00:00) Alprazolam Tablets What is this medication? ALPRAZOLAM (al PRAY zoe lam) treats anxiety. It works by Education administrator system calm down. It belongs to a group of medications called benzodiazepines. This medicine may be used for other purposes; ask your health care provider or pharmacist if you have questions. COMMON BRAND NAME(S): Xanax What should I tell my care team before I take this medication? They need to know if you have any of these conditions: Kidney disease Liver disease Lung disease, such as asthma or breathing problems Mental health condition Seizures Substance use disorder Suicidal thoughts, plans, or attempt by you or a family member An unusual or allergic reaction to alprazolam, other medications, foods, dyes, or preservatives Pregnant or trying to get pregnant Breastfeeding How should I use this medication? Take this medication by mouth. Take it as directed on the prescription label. Do not take it more often than directed. Keep taking it unless your care team tells you to stop. A special MedGuide will be given to you by the pharmacist with each prescription and refill. Be sure to read this information carefully each time. Talk to your care team about the use of this medication in children. Special care may be needed. People 65 years and older may have a stronger reaction and need a smaller dose. Overdosage: If you think you have taken too much of this medicine contact a poison control center or emergency room at once. NOTE: This medicine is only for you. Do not share this medicine with others. What if I miss a dose? If you miss a dose, take it as soon as you can. If it is almost time for your next dose, take only that dose. Do not take double or extra doses. What  may interact with this medication? Do not take this medication with any of the following: Adagrasib Certain antivirals for HIV or hepatitis Certain medications for fungal infections, such as ketoconazole, itraconazole, or posaconazole Clarithromycin Grapefruit juice Opioid medications for cough Sodium oxybate This medication may also interact with the following: Alcohol Antihistamines for allergy, cough and cold Certain medications for anxiety or sleep Certain medications for depression, such as amitriptyline, fluoxetine, fluvoxamine, nefazodone, sertraline Certain medications for seizures, such as carbamazepine, phenobarbital, phenytoin, primidone Cimetidine Digoxin Erythromycin Estrogen or progestin hormones General anesthetics, such as halothane, isoflurane, methoxyflurane, propofol Medications that relax muscles Opioid medications for pain Phenothiazines, such as chlorpromazine, mesoridazine, prochlorperazine, thioridazine This list may not describe all possible interactions. Give your health care provider a list of all the medicines, herbs, non-prescription drugs, or dietary supplements you use. Also tell them if you smoke, drink alcohol, or use illegal drugs. Some items may interact with your medicine. What should I watch for while using this medication? Visit your care team for regular checks on your progress. Tell your care team if your symptoms do not  start to get better or if they get worse. Do not stop taking except on your care team's advice. You may develop a severe reaction. Your care team will tell you how much medication to take. Long term use of this medication may cause your brain and body to depend on it. This can happen even when used as directed by your care team. You and your care team will work together to determine how long you will need to take this medication. This medication may affect your coordination, reaction time, or judgment. Do not drive or operate  machinery until you know how this medication affects you. Sit up or stand slowly to reduce the risk of dizzy or fainting spells. Drinking alcohol with this medication can increase the risk of these side effects. If you are taking another medication that also causes drowsiness, you may have more side effects. Give your care team a list of all medications you use. Your care team will tell you how much medication to take. Do not take more medication than directed. Get emergency help right away if you have problems breathing or unusual sleepiness. If you or your family notice any changes in your behavior, such as new or worsening depression, thoughts of harming yourself, anxiety, other unusual or disturbing thoughts, or memory loss, call your care team right away. Talk to your care team if you wish to become pregnant or think you might be pregnant. This medication can cause serious birth defects. Talk to your care team before breastfeeding. Changes to your treatment plan may be needed. What side effects may I notice from receiving this medication? Side effects that you should report to your care team as soon as possible: Allergic reactions--skin rash, itching, hives, swelling of the face, lips, tongue, or throat CNS depression--slow or shallow breathing, shortness of breath, feeling faint, dizziness, confusion, trouble staying awake Thoughts of suicide or self-harm, worsening mood, feelings of depression Side effects that usually do not require medical attention (report to your care team if they continue or are bothersome): Change in sex drive or performance Dizziness Drowsiness Nausea This list may not describe all possible side effects. Call your doctor for medical advice about side effects. You may report side effects to FDA at 1-800-FDA-1088. Where should I keep my medication? Keep out of the reach of children and pets. This medication can be abused. Keep it in a safe place to protect it from theft.  Do not share it with anyone. It is only for you. Selling or giving away this medication is dangerous and against the law. Store at room temperature between 20 and 25 degrees C (68 and 77 degrees F). Get rid of any unused medication after the expiration date. This medication may cause harm and death if it is taken by other adults, children, or pets. It is important to get rid of the medication as soon as you no longer need it, or it is expired. You can do this in two ways: Take the medication to a medication take-back program. Check with your pharmacy or law enforcement to find a location. If you cannot return the medication, check the label or package insert to see if the medication should be thrown out in the garbage or flushed down the toilet. If you are not sure, ask your care team. If it is safe to put it in the trash, take the medication out of the container. Mix the medication with cat litter, dirt, coffee grounds, or other unwanted substance. Seal the  mixture in a bag or container. Put it in the trash. NOTE: This sheet is a summary. It may not cover all possible information. If you have questions about this medicine, talk to your doctor, pharmacist, or health care provider.  2024 [employer]vier/Gold Standard (2021-05-22 00:00:00)

## 2023-03-08 NOTE — Progress Notes (Signed)
 [facility]    Provider:  Dr Lucia Gaskins Requesting Provider: Rick Duff, PA-C Primary Care Provider:  Rick Duff, PA-C  CC:  headaches  HPI:  Ruth Duncan is a 53 y.o. female here as requested by Rick Duff, PA-C for migraines. has IBS (irritable bowel syndrome); Panic disorder without agoraphobia; Schizoaffective disorder, depressive type (HCC); Gastroesophageal reflux disease; Attention deficit disorder; Colon cancer screening; Gastrointestinal hemorrhage with melena; Abdominal pain; Back pain of thoracolumbar region; Dysphagia; LLQ pain; Loose stools; Chest pain; Family history of mitral valve disorder; Lower abdominal pain; History of smoking 30 or more pack years; Coronary artery disease involving native coronary artery of native heart with unstable angina pectoris (HCC); Mixed hyperlipidemia; and Chronic migraine without aura, with intractable migraine, so stated, with status migrainosus on their problem list.  She had migraines years ago, she had light sensitivity, nausea in the past. For 3 months had a constant headache and still has these headaches which are new. She also feels she has cognitive issues with the headaches, she has difficulty relating messages to her hands such as with password, she goes into the room she doesn't know what she went in for, she sometimes goes by her destination. She just woke up one day and had a headache, one day she had a headache and it stayed for 3 months, dull headache, tempo-parietal areas, symmetrical, dull, it was just there all the time, no inciting events or triggers, she tried things like caffeine and didn;t help, nothing helped the headache, nothing made the headache worse except stress her head was killing her during the stress, her doctor gave her imitrex and she has tried imitrex and maxalt in the past and that helped a little bit. She is post menopausal, on estrogen/progesterone and reports she is on  testosterone due to low levels, she feels so much better abd has more ebergy but this was not coincident with the headaches. She still has a dull headache worse at night, feels better in dim light so photosesnsitivity, it can pulsate and pound, no nausea but in the past had nausea, hurts to move, she has vision changes she feels she can;t see she keeps going to the eye doctor. No aura. No medication overuse. Migraines can be moderate to severe at least 8 days a month and last 24 hours and daily headaches for the last 4 months.   From a review of records and patient report, medications tried and failed for greater than 3 months that can be used in migraine management include: Wellbutrin, Benadryl, doxepin(is a Tricyclic Antidepressant same class as amitriptyline and she is currently on it so cannot prescribe amitriptyline or nortriptyline as same class), Lexapro, Prozac, gabapentin, hydroxyzine, ketorolac injections, Lamictal Latuda, Mobic, Zofran, Phenergan, Imitrex, Maxalt, topiramate, tramadol, trazodone, blood pressure medication contraindicated due to hypotension. Aimovig contraindicated due to constipation.    Reviewed notes, labs and imaging from outside physicians, which showed:  CT head 2011 : normal  Narrative  Clinical Data:  Blurred vision.   CT HEAD WITHOUT CONTRAST   Technique:  Contiguous axial images were obtained from the base of the skull through the vertex without contrast   Comparison:  11/17/2006   Findings:  The brain has a normal appearance without evidence for hemorrhage, acute infarction, hydrocephalus, or mass lesion.  There is no extra axial fluid collection.  The skull and paranasal sinuses are normal.   IMPRESSION: Normal CT of the head without contrast.      Latest  Ref Rng & Units 01/22/2022   11:40 AM 07/17/2021    9:15 AM 11/04/2020    3:09 PM  CBC  WBC 3.4 - 10.8 x10E3/uL 6.3  5.6  6.9   Hemoglobin 11.1 - 15.9 g/dL 16.1  09.6  04.5   Hematocrit 34.0 -  46.6 % 42.0  40.5  40.6   Platelets 150 - 450 x10E3/uL 253  242  234       Latest Ref Rng & Units 12/09/2022   10:24 AM 07/27/2022    1:07 PM 01/22/2022   11:40 AM  CMP  Glucose 70 - 99 mg/dL   87   BUN 6 - 24 mg/dL   10   Creatinine 4.09 - 1.00 mg/dL   8.11   Sodium 914 - 782 mmol/L   139   Potassium 3.5 - 5.2 mmol/L   4.5   Chloride 96 - 106 mmol/L   100   CO2 20 - 29 mmol/L   26   Calcium 8.7 - 10.2 mg/dL   9.7   ALT 0 - 32 IU/L 13  15     From a review of records and patient report, medications tried and failed for greater than 3 months that can be used in migraine management include: Wellbutrin, Benadryl, doxepin, Lexapro, Prozac, gabapentin, hydroxyzine, ketorolac injections, Lamictal Latuda, Mobic, Zofran, Phenergan, Imitrex, Maxalt, topiramate, tramadol, trazodone  Review of Systems: Patient complains of symptoms per HPI as well as the following symptoms none. Pertinent negatives and positives per HPI. All others negative.   Social History   Socioeconomic History   Marital status: Legally Separated    Spouse name: Not on file   Number of children: Not on file   Years of education: Not on file   Highest education level: Not on file  Occupational History   Occupation: Disability    Comment: since 2010  Tobacco Use   Smoking status: Former    Current packs/day: 0.00    Average packs/day: 1 pack/day for 30.0 years (30.0 ttl pk-yrs)    Types: Cigarettes    Start date: 11/05/1982    Quit date: 11/04/2012    Years since quitting: 10.3   Smokeless tobacco: Never  Vaping Use   Vaping status: Never Used  Substance and Sexual Activity   Alcohol use: No    Alcohol/week: 0.0 standard drinks of alcohol   Drug use: No   Sexual activity: Yes    Partners: Male    Birth control/protection: None  Other Topics Concern   Not on file  Social History Narrative   ** Merged History Encounter **       Savahna was born and grew up in [place], [place]. Her parents divorced  when she was 33 years of age, and she grew up with her paternal grandmother and grandfather. Her grandfather was an alcoholic, and Malyiah found him dead of an MI at the age of 53. Her mother used to lock her in a dark bathroom for an hour or so very frequently, and Momo continues to have phobias associated with opening a bathroom door. Murial graduated from [employer], but has had no real career because she was unable to hold a job. She has been married for 22 years and has 3 boys, twins age 26 and her oldest son age 65. 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 She is currently attending Alcoholics Anonymous meetings 3 times a week, has a sponsor, and  is working the 12 steps.   Social Drivers of Corporate investment banker Strain: Not on file  Food Insecurity: Not on file  Transportation Needs: Not on file  Physical Activity: Sufficiently Active (07/16/2021)   Received from [facility], [facility] Care   Exercise Vital Sign    Days of Exercise per Week: 3 days    Minutes of Exercise per Session: 60 min  Stress: No Stress Concern Present (01/28/2022)   Received from [facility], [facility] Occupational [facility] - Occupational Stress Questionnaire    Feeling of Stress : Not at all  Social Connections: Unknown (05/17/2021)   Received from [facility], [facility]   Social Network    Social Network: Not on file  Intimate Partner Violence: Not At Risk (03/18/2022)   Received from [facility], [facility]   Humiliation, Afraid, Rape, and Kick questionnaire    Fear of Current or Ex-Partner: No    Emotionally Abused: No    Physically Abused: No    Sexually Abused: No    Family History  Problem Relation Age of Onset   Hypertension Mother    Kidney disease Father    Hypertension Father    Coronary artery disease Father    Breast cancer Paternal Grandmother        post menopausal   Alcohol abuse Paternal Grandfather     Colon cancer Neg Hx     Past Medical History:  Diagnosis Date   ADD (attention deficit disorder)    Atherosclerotic cardiovascular disease    Bipolar disorder (HCC)    Chronic abdominal pain    hx of IBS-D, ? bile salt-induced diarrhea   Chronic eczema of hand 2012   BILATERAL ON MTX SINCE 2014   Complication of anesthesia    Diverticulitis    Gastrointestinal hemorrhage    GERD (gastroesophageal reflux disease)    History of kidney stones    Hyperlipidemia    Melena    Ovarian cancer (HCC) 2010   PONV (postoperative nausea and vomiting)    Schizoaffective disorder (HCC)    Surgical menopause 2010   Tubular adenoma 2010    Patient Active Problem List   Diagnosis Date Noted   Chronic migraine without aura, with intractable migraine, so stated, with status migrainosus 03/08/2023   History of smoking 30 or more pack years 04/12/2022   Coronary artery disease involving native coronary artery of native heart with unstable angina pectoris (HCC) 04/12/2022   Mixed hyperlipidemia 04/12/2022   Lower abdominal pain 01/22/2022   Family history of mitral valve disorder 07/03/2021   Loose stools 07/01/2021   Chest pain 07/01/2021   LLQ pain 03/12/2021   Dysphagia 01/24/2019   Back pain of thoracolumbar region 01/28/2016   Abdominal pain 05/20/2015   Gastrointestinal hemorrhage with melena 10/09/2014   Colon cancer screening 02/07/2014   Attention deficit disorder 01/25/2012   Gastroesophageal reflux disease 01/06/2012   Schizoaffective disorder, depressive type (HCC) 11/18/2011   Panic disorder without agoraphobia 05/26/2011   IBS (irritable bowel syndrome) 10/28/2008    Past Surgical History:  Procedure Laterality Date   ABDOMINAL HYSTERECTOMY  01/05/2008   APPENDECTOMY  01/04/2002   BACTERIAL OVERGROWTH TEST N/A 09/01/2015   Procedure: BACTERIAL OVERGROWTH TEST;  Surgeon: West Bali, MD;  [facility];  Service: Endoscopy;  Laterality: N/A;  700   BALLOON  DILATION N/A 03/31/2020   Procedure: BALLOON DILATION;  Surgeon: Lanelle Bal, DO;  [facility];  Service: Endoscopy;  Laterality: N/A;   BALLOON DILATION N/A 04/16/2022   Procedure: BALLOON DILATION;  Surgeon: Lanelle Bal, DO;  [facility];  Service: Endoscopy;  Laterality: N/A;   CESAREAN SECTION  01/05/1996   COLONOSCOPY  12/04/2008   tubular adenomas, negative microscopic colitis. Due for Flex Sig Dec 2015   ESOPHAGOGASTRODUODENOSCOPY  05/06/2008   ZOX:WRUEAV esophagus without evidence of Barrett's, mass, erosion, ulceration or stricture/ Hiatal hernia/ Normal duodenal bulb and second portion of the duodenum   ESOPHAGOGASTRODUODENOSCOPY N/A 10/04/2014   Dr. Darrick Penna: patent stricture at GE junction, no dilation, small hiatal hernia, mild non-erosive gastritis (benign), multiple small gastric polyps, benign. Capsule study recommended but never completed    ESOPHAGOGASTRODUODENOSCOPY (EGD) WITH PROPOFOL N/A 04/10/2019   Procedure: ESOPHAGOGASTRODUODENOSCOPY (EGD) WITH PROPOFOL;  Surgeon: West Bali, MD;  [facility];  Service: Endoscopy;  Laterality: N/A;  1:15pm-moved to 4/6 @ 2:45pm   ESOPHAGOGASTRODUODENOSCOPY (EGD) WITH PROPOFOL N/A 03/31/2020   Surgeon: Lanelle Bal, DO; small hiatal hernia, Schatzki's ring dilated, gastritis biopsied, normal examined duodenum.  Biopsies negative for H. pylori.   ESOPHAGOGASTRODUODENOSCOPY (EGD) WITH PROPOFOL N/A 04/16/2022   Procedure: ESOPHAGOGASTRODUODENOSCOPY (EGD) WITH PROPOFOL;  Surgeon: Lanelle Bal, DO;  [facility];  Service: Endoscopy;  Laterality: N/A;  900am, asa 2   ESOPHAGOGASTRODUODENOSCOPY (EGD) WITH PROPOFOL N/A 10/22/2022   Procedure: ESOPHAGOGASTRODUODENOSCOPY (EGD) WITH PROPOFOL;  Surgeon: Dolores Frame, MD;  [facility];  Service: Gastroenterology;  Laterality: N/A;  9:15AM;ASA 1, moved to 10/18 at 9:30 per Tanya, pt not willing to move up    FLEXIBLE SIGMOIDOSCOPY N/A 02/18/2014   SLF: Normal small bowel. formed stool at anastomosis. Stool cleared with irrigation. One superfical erosion at the anastomosis. otherwise normal. normal rectal mucosa   SAVORY DILATION N/A 04/10/2019   Procedure: SAVORY DILATION;  Surgeon: West Bali, MD;  [facility];  Service: Endoscopy;  Laterality: N/A;   SAVORY DILATION  10/22/2022   Procedure: SAVORY DILATION;  Surgeon: Marguerita Merles, Reuel Boom, MD;  [facility];  Service: Gastroenterology;;   SUBTOTAL COLECTOMY  01/04/2009   TUBAL LIGATION      Current Outpatient Medications  Medication Sig Dispense Refill   ALPRAZolam (XANAX) 0.25 MG tablet Take 1 tablet (0.25 mg total) by mouth daily as needed. for anxiety 20 tablet 0   ALPRAZolam (XANAX) 0.25 MG tablet Take 1-2 tabs (0.25mg -0.50mg ) 30-60 minutes before procedure. May repeat if needed.Do not drive. 4 tablet 0   atomoxetine (STRATTERA) 60 MG capsule Take 1 capsule (60 mg total) by mouth daily. TAKE 1 CAPSULE BY MOUTH EVERY DAY 30 capsule 2   buPROPion (WELLBUTRIN XL) 150 MG 24 hr tablet Take 1 tablet (150 mg total) by mouth daily. 30 tablet 2   dexlansoprazole (DEXILANT) 60 MG capsule Take 1 capsule (60 mg total) by mouth daily. 90 capsule 3   dicyclomine (BENTYL) 10 MG capsule TAKE 1 CAPSULE (10 MG TOTAL) BY MOUTH 3 (THREE) TIMES DAILY BEFORE MEALS. AS NEEDED 270 capsule 1   doxepin (SINEQUAN) 10 MG capsule Take 1 capsule (10 mg total) by mouth at bedtime. 30 capsule 2   estradiol (ESTRACE) 0.1 MG/GM vaginal cream Place 1 g vaginally at bedtime.     Evolocumab (REPATHA SURECLICK) 140 MG/ML SOAJ Inject 140 mg into the skin every 14 (fourteen) days. 2 mL 11   Fremanezumab-vfrm (AJOVY) 225 MG/1.5ML SOAJ Inject 225 mg into the skin every 30 (  thirty) days. 3 mL    Fremanezumab-vfrm (AJOVY) 225 MG/1.5ML SOAJ Inject 225 mg into the skin every 30 (thirty) days. Please use copay card: BIN# [id number] PCN# [id number] GRP# [id number] ID#  [id number] EXP 01/04/2024 1.5 mL 11   lithium 300 MG tablet TAKE 2 TABLET IN AM, 2 TABLET AT NOON AND 2 TABLET IN EVENING 180 tablet 2   nitrofurantoin (MACRODANTIN) 50 MG capsule Take 50 mg by mouth daily as needed (UTI).  5   nitroGLYCERIN (NITROSTAT) 0.4 MG SL tablet Place 0.4 mg under the tongue every 5 (five) minutes as needed for chest pain.     ondansetron (ZOFRAN) 4 MG tablet Take 1 tablet (4 mg total) by mouth every 6 (six) hours as needed for nausea or vomiting. 30 tablet 0   rosuvastatin (CRESTOR) 10 MG tablet Take 1 tablet (10 mg total) by mouth daily. 90 tablet 3   SUMAtriptan (IMITREX) 25 MG tablet daily at 6 (six) AM.     valACYclovir (VALTREX) 500 MG tablet Take 500 mg by mouth daily as needed (outbreaks).     Current Facility-Administered Medications  Medication Dose Route Frequency Provider Last Rate Last Admin   Fremanezumab-vfrm SOSY 225 mg  225 mg Subcutaneous Once         Allergies as of 03/08/2023   (No Known Allergies)    Vitals: BP 108/76 (BP Location: Right Arm, Patient Position: Sitting, Cuff Size: Small)   Pulse 69   Ht 5\' 3"  (1.6 m)   Wt 112 lb (50.8 kg)   BMI 19.84 kg/m  Last Weight:  Wt Readings from Last 1 Encounters:  03/08/23 112 lb (50.8 kg)   Last Height:   Ht Readings from Last 1 Encounters:  03/08/23 5\' 3"  (1.6 m)     Physical exam: Exam: Gen: NAD, conversant, well nourised, well groomed                     CV: RRR, no MRG. No Carotid Bruits. No peripheral edema, warm, nontender Eyes: Conjunctivae clear without exudates or hemorrhage  Neuro: Detailed Neurologic Exam  Speech:    Speech is normal; fluent and spontaneous with normal comprehension.  Cognition:    The patient is oriented to person, place, and time;     recent and remote memory intact;     language fluent;     normal attention, concentration,     fund of knowledge Cranial Nerves:    The pupils are equal, round, and reactive to light. The fundi are normal and  spontaneous venous pulsations are present. Visual fields are full to finger confrontation. Extraocular movements are intact. Trigeminal sensation is intact and the muscles of mastication are normal. The face is symmetric. The palate elevates in the midline. Hearing intact. Voice is normal. Shoulder shrug is normal. The tongue has normal motion without fasciculations.   Coordination: nml  Gait: nml  Motor Observation:    No asymmetry, no atrophy, and no involuntary movements noted. Tone:    Normal muscle tone.    Posture:    Posture is normal. normal erect    Strength:    Strength is V/V in the upper and lower limbs.      Sensation: intact to LT     Reflex Exam:  DTR's:    Deep tendon reflexes in the upper and lower extremities are normal bilaterally.   Toes:    The toes are downgoing bilaterally.   Clonus:    Clonus is absent.  Assessment/Plan:  Patient with chronic migraines; Migraines can be moderate to severe at least 8 days a month and last 24 hours and daily headaches for the last 4 months. But given concerning symptoms will order MRI of the brain.   MRI brain: MRI brain due to concerning symptoms of morning headaches, positional headaches,vision changes, worsening headaches, new onset chronic daily intactable headache after the age of 41, changes in quality and severity and frequency of headache/migraines  to look for space occupying mass, chiari or intracranial hypertension (pseudotumor), strokes, malignancies, vasculidities, demyelination(multiple sclerosis) or other  Needs xanax for MRI  Start Ajovy and f/u 4 months video    Orders Placed This Encounter  Procedures   MR BRAIN W WO CONTRAST   Meds ordered this encounter  Medications   Fremanezumab-vfrm SOSY 225 mg   Fremanezumab-vfrm (AJOVY) 225 MG/1.5ML SOAJ    Sig: Inject 225 mg into the skin every 30 (thirty) days.    Dispense:  3 mL   ALPRAZolam (XANAX) 0.25 MG tablet    Sig: Take 1-2 tabs  (0.25mg -0.50mg ) 30-60 minutes before procedure. May repeat if needed.Do not drive.    Dispense:  4 tablet    Refill:  0   Fremanezumab-vfrm (AJOVY) 225 MG/1.5ML SOAJ    Sig: Inject 225 mg into the skin every 30 (thirty) days. Please use copay card: BIN# [id number] PCN# [id number] GRP# [id number] ID# [id number] EXP 01/04/2024    Dispense:  1.5 mL    Refill:  11    Please use copay card: BIN# [id number] PCN# [id number] GRP# [id number] ID# [id number] EXP 01/04/2024   Discussed: To prevent or relieve headaches, try the following: Cool Compress. Lie down and place a cool compress on your head.  Avoid headache triggers. If certain foods or odors seem to have triggered your migraines in the past, avoid them. A headache diary might help you identify triggers.  Include physical activity in your daily routine. Try a daily walk or other moderate aerobic exercise.  Manage stress. Find healthy ways to cope with the stressors, such as delegating tasks on your to-do list.  Practice relaxation techniques. Try deep breathing, yoga, massage and visualization.  Eat regularly. Eating regularly scheduled meals and maintaining a healthy diet might help prevent headaches. Also, drink plenty of fluids.  Follow a regular sleep schedule. Sleep deprivation might contribute to headaches Consider biofeedback. With this mind-body technique, you learn to control certain bodily functions -- such as muscle tension, heart rate and blood pressure -- to prevent headaches or reduce headache pain.    Proceed to emergency room if you experience new or worsening symptoms or symptoms do not resolve, if you have new neurologic symptoms or if headache is severe, or for any concerning symptom.   Provided education and documentation from American headache Society toolbox including articles on: chronic migraine medication overuse headache, chronic migraines, prevention of migraines, behavioral and other nonpharmacologic treatments for headache.  Cc: Deno Lunger, PA-C  Naomie Dean, MD  [facility] [address] [place], [place] [address]  Phone [phone] Fax [phone]

## 2023-03-09 ENCOUNTER — Other Ambulatory Visit (HOSPITAL_COMMUNITY): Payer: Self-pay

## 2023-03-09 ENCOUNTER — Telehealth: Payer: Self-pay

## 2023-03-09 NOTE — Telephone Encounter (Signed)
 Pharmacy Patient Advocate Encounter  Received notification from [facility] that Prior Authorization for AJOVY (fremanezumab-vfrm) injection 225MG /1.5ML auto-injectors  has been DENIED.  Full denial letter will be uploaded to the media tab. See denial reason below.   AJOVY INJ 225/1.5 is denied because it is not on your plan's Drug List (formulary). Medication authorization requires the following: (1) You need to try two (2) of these covered drugs: (a) Emgality autoinjector*. (b) Qulipta*. (2) OR your doctor needs to give [place] specific medical reasons why two (2) of the covered drug(s) are not appropriate for you

## 2023-03-09 NOTE — Telephone Encounter (Signed)
 PA request has been Submitted. New Encounter has been or will be created for follow up. For additional info see Pharmacy Prior Auth telephone encounter from 03/05.

## 2023-03-09 NOTE — Telephone Encounter (Signed)
*  [facility]  Pharmacy Patient Advocate Encounter   Received notification from RX Request Messages that prior authorization for AJOVY (fremanezumab-vfrm) injection 225MG /1.5ML auto-injectors  is required/requested.   Insurance verification completed.   The patient is insured through [facility] .   Per test claim: PA required; PA submitted to above mentioned insurance via CoverMyMeds Key/confirmation #/EOC [id number] Status is pending

## 2023-03-10 NOTE — Telephone Encounter (Signed)
 Please prescribe emgality thanks and

## 2023-03-14 NOTE — Telephone Encounter (Signed)
 I called and LMVM for her on her mobile.  Ajovy denied not on formulary list.  Try emgality or qulipta.  Dr Lucia Gaskins ok to try emgality wanted to speak to you first then will proceed.  May call or mychart.

## 2023-03-15 MED ORDER — EMGALITY 120 MG/ML ~~LOC~~ SOAJ: 120.0000 mg | SUBCUTANEOUS | 11 refills | Status: DC

## 2023-03-15 NOTE — Addendum Note (Signed)
 Addended by: Guy Begin on: 03/15/2023 04:14 PM   Modules accepted: Orders

## 2023-03-15 NOTE — Telephone Encounter (Signed)
 LMVM for pt to call back if ok to proceed with emgality since insurance denied ajovy.  (2nd call).

## 2023-03-15 NOTE — Telephone Encounter (Addendum)
 Pt returned call and she is ok to start Emgality,  she received first injection ajovy and then has 2 samples.  I told her that will start process. No need to do loading dose since already started ajovy. She appreciated call back.   She understands will require a PA.  She understood.

## 2023-03-18 ENCOUNTER — Telehealth: Payer: Self-pay

## 2023-03-18 ENCOUNTER — Other Ambulatory Visit (HOSPITAL_COMMUNITY): Payer: Self-pay

## 2023-03-18 NOTE — Telephone Encounter (Signed)
 Pharmacy Patient Advocate Encounter   Received notification from CoverMyMeds that prior authorization for Emgality 120MG /ML auto-injectors (migraine) is required/requested.   Insurance verification completed.   The patient is insured through [facility] Medicare Part D .   Per test claim: PA required; PA submitted to above mentioned insurance via CoverMyMeds Key/confirmation #/EOC [id number] Status is pending

## 2023-03-22 ENCOUNTER — Encounter: Payer: Self-pay | Admitting: *Deleted

## 2023-03-22 NOTE — Telephone Encounter (Signed)
 Pharmacy Patient Advocate Encounter  Received notification from [facility] Medicare Part D that Prior Authorization for Emgality 120MG /ML auto-injectors (migraine) has been APPROVED from 03-18-2023 to 01-04-2024   PA #/Case ID/Reference #: [id number]

## 2023-03-23 ENCOUNTER — Other Ambulatory Visit (HOSPITAL_COMMUNITY): Payer: Self-pay | Admitting: Psychiatry

## 2023-03-23 DIAGNOSIS — F900 Attention-deficit hyperactivity disorder, predominantly inattentive type: Secondary | ICD-10-CM

## 2023-03-23 DIAGNOSIS — F250 Schizoaffective disorder, bipolar type: Secondary | ICD-10-CM

## 2023-03-23 DIAGNOSIS — F410 Panic disorder [episodic paroxysmal anxiety] without agoraphobia: Secondary | ICD-10-CM

## 2023-03-29 ENCOUNTER — Other Ambulatory Visit

## 2023-03-30 ENCOUNTER — Other Ambulatory Visit: Payer: Self-pay | Admitting: Gastroenterology

## 2023-03-30 DIAGNOSIS — R195 Other fecal abnormalities: Secondary | ICD-10-CM

## 2023-04-07 NOTE — Telephone Encounter (Signed)
 I called and LMVM for pt that emgality approved.  She had not read her mychart email concerning that.

## 2023-04-11 ENCOUNTER — Encounter: Payer: Self-pay | Admitting: Neurology

## 2023-04-11 ENCOUNTER — Ambulatory Visit
Admission: RE | Admit: 2023-04-11 | Discharge: 2023-04-11 | Disposition: A | Discharge status: Home / Self Care | Source: Ambulatory Visit | Attending: Neurology | Admitting: Neurology

## 2023-04-11 DIAGNOSIS — H539 Unspecified visual disturbance: Secondary | ICD-10-CM | POA: Diagnosis not present

## 2023-04-11 DIAGNOSIS — R51 Headache with orthostatic component, not elsewhere classified: Secondary | ICD-10-CM | POA: Diagnosis not present

## 2023-04-11 DIAGNOSIS — R519 Headache, unspecified: Secondary | ICD-10-CM | POA: Diagnosis not present

## 2023-04-11 MED ORDER — GADOPICLENOL 0.5 MMOL/ML IV SOLN
5.0000 mL | Freq: Once | INTRAVENOUS | Status: AC | PRN
  Administered 2023-04-11: 5 mL via INTRAVENOUS

## 2023-04-26 ENCOUNTER — Telehealth: Payer: Self-pay | Admitting: Pharmacist

## 2023-04-26 ENCOUNTER — Other Ambulatory Visit: Payer: Self-pay | Admitting: Pharmacist

## 2023-04-26 DIAGNOSIS — E782 Mixed hyperlipidemia: Secondary | ICD-10-CM

## 2023-04-26 NOTE — Telephone Encounter (Signed)
 Repatha  was added to Crestor  10 mg Feb, 2025. Call pt to remind about follow up lab. N/A LVM

## 2023-04-27 ENCOUNTER — Other Ambulatory Visit (HOSPITAL_COMMUNITY): Payer: Self-pay

## 2023-05-04 LAB — LIPID PANEL
Chol/HDL Ratio: 3.1 ratio (ref 0.0–4.4)
Cholesterol, Total: 178 mg/dL (ref 100–199)
HDL: 57 mg/dL (ref >39)
LDL Chol Calc (NIH): 105 mg/dL — ABNORMAL HIGH (ref 0–99)
Triglycerides: 84 mg/dL (ref 0–149)
VLDL Cholesterol Cal: 16 mg/dL (ref 5–40)

## 2023-05-04 LAB — HEPATIC FUNCTION PANEL
ALT: 10 IU/L (ref 0–32)
AST: 13 IU/L (ref 0–40)
Albumin: 4.7 g/dL (ref 3.8–4.9)
Alkaline Phosphatase: 84 IU/L (ref 44–121)
Bilirubin Total: 0.4 mg/dL (ref 0.0–1.2)
Bilirubin, Direct: 0.13 mg/dL (ref 0.00–0.40)
Total Protein: 7.1 g/dL (ref 6.0–8.5)

## 2023-05-04 LAB — LITHIUM LEVEL: Lithium Lvl: <0.1 mmol/L — ABNORMAL LOW (ref 0.5–1.2)

## 2023-05-09 ENCOUNTER — Telehealth: Payer: Self-pay | Admitting: Pharmacy Technician

## 2023-05-09 ENCOUNTER — Other Ambulatory Visit (HOSPITAL_COMMUNITY): Payer: Self-pay

## 2023-05-09 DIAGNOSIS — E782 Mixed hyperlipidemia: Secondary | ICD-10-CM

## 2023-05-09 NOTE — Telephone Encounter (Signed)
 Spoke to patient over the phone, her LDL did not changed on Repatha  ( non responder to PCSK9i agent) will consider addition of Nexlizet 180/10 mg daily to max tolerated statin ( Crestor  10 mg daily). Patient will be going for baseline Uric acid lab 05/10/2023. Will assess coverage for Nexlizet

## 2023-05-09 NOTE — Telephone Encounter (Signed)
 Pharmacy Patient Advocate Encounter   Received notification from Pt Calls Messages that prior authorization for NEXLIZET is required/requested.   Insurance verification completed.   The patient is insured through [facility] .   Per test claim: PA required; PA submitted to above mentioned insurance via CoverMyMeds Key/confirmation #/EOC [id number] Status is pending

## 2023-05-09 NOTE — Telephone Encounter (Signed)
 Pharmacy Patient Advocate Encounter  Received notification from [facility] that Prior Authorization for nexlizet has been APPROVED from 05/09/23 to 11/09/23. Ran test claim, Copay is $0.00. This test claim was processed through [facility]- copay amounts may vary at other pharmacies due to pharmacy/plan contracts, or as the patient moves through the different stages of their insurance plan.   PA #/Case ID/Reference #:  [id number]

## 2023-05-10 ENCOUNTER — Other Ambulatory Visit: Payer: Self-pay

## 2023-05-10 DIAGNOSIS — I25110 Atherosclerotic heart disease of native coronary artery with unstable angina pectoris: Secondary | ICD-10-CM

## 2023-05-10 DIAGNOSIS — E782 Mixed hyperlipidemia: Secondary | ICD-10-CM

## 2023-05-11 LAB — ALT: ALT: 11 IU/L (ref 0–32)

## 2023-05-11 NOTE — Addendum Note (Signed)
 Addended by: Jame Morrell K on: 05/11/2023 11:11 AM   Modules accepted: Orders

## 2023-05-13 ENCOUNTER — Encounter: Payer: Self-pay | Admitting: Pharmacist

## 2023-05-18 ENCOUNTER — Other Ambulatory Visit (HOSPITAL_COMMUNITY): Payer: Self-pay

## 2023-05-21 LAB — URIC ACID: Uric Acid: 3 mg/dL (ref 3.0–7.2)

## 2023-05-23 ENCOUNTER — Ambulatory Visit: Payer: Self-pay | Admitting: Pharmacist

## 2023-05-23 MED ORDER — NEXLIZET 180-10 MG PO TABS
1.0000 | ORAL_TABLET | Freq: Every day | ORAL | 11 refills | Status: AC
Start: 2023-05-23 — End: ?

## 2023-05-26 ENCOUNTER — Encounter (HOSPITAL_COMMUNITY): Payer: Self-pay

## 2023-05-26 ENCOUNTER — Telehealth (HOSPITAL_COMMUNITY): Payer: 59 | Admitting: Psychiatry

## 2023-06-20 ENCOUNTER — Encounter (HOSPITAL_COMMUNITY): Payer: Self-pay | Admitting: Psychiatry

## 2023-06-20 ENCOUNTER — Telehealth (HOSPITAL_BASED_OUTPATIENT_CLINIC_OR_DEPARTMENT_OTHER): Admitting: Psychiatry

## 2023-06-20 VITALS — Wt 112.0 lb

## 2023-06-20 DIAGNOSIS — F250 Schizoaffective disorder, bipolar type: Secondary | ICD-10-CM

## 2023-06-20 DIAGNOSIS — F900 Attention-deficit hyperactivity disorder, predominantly inattentive type: Secondary | ICD-10-CM

## 2023-06-20 DIAGNOSIS — F410 Panic disorder [episodic paroxysmal anxiety] without agoraphobia: Secondary | ICD-10-CM | POA: Diagnosis not present

## 2023-06-20 MED ORDER — ATOMOXETINE HCL 60 MG PO CAPS: 60.0000 mg | ORAL_CAPSULE | Freq: Every day | ORAL | 1 refills | Status: DC

## 2023-06-20 MED ORDER — BUPROPION HCL ER (XL) 150 MG PO TB24
150.0000 mg | ORAL_TABLET | Freq: Every day | ORAL | 1 refills | Status: DC
Start: 2023-06-20 — End: 2023-08-15

## 2023-06-20 MED ORDER — CARBAMAZEPINE 200 MG PO TABS: 200.0000 mg | ORAL_TABLET | Freq: Every day | ORAL | 1 refills | Status: DC

## 2023-06-20 MED ORDER — LITHIUM CARBONATE 300 MG PO TABS: ORAL_TABLET | ORAL | 0 refills | Status: DC

## 2023-06-20 MED ORDER — DOXEPIN HCL 10 MG PO CAPS: 10.0000 mg | ORAL_CAPSULE | Freq: Every day | ORAL | 1 refills | Status: DC
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
## 2023-06-27 ENCOUNTER — Other Ambulatory Visit (HOSPITAL_COMMUNITY): Payer: Self-pay | Admitting: Psychiatry

## 2023-06-27 DIAGNOSIS — F250 Schizoaffective disorder, bipolar type: Secondary | ICD-10-CM

## 2023-07-12 ENCOUNTER — Encounter: Payer: Self-pay | Admitting: Neurology

## 2023-07-12 ENCOUNTER — Telehealth (INDEPENDENT_AMBULATORY_CARE_PROVIDER_SITE_OTHER): Admitting: Neurology

## 2023-07-12 ENCOUNTER — Telehealth: Payer: Self-pay | Admitting: Neurology

## 2023-07-12 DIAGNOSIS — G43711 Chronic migraine without aura, intractable, with status migrainosus: Secondary | ICD-10-CM

## 2023-07-12 MED ORDER — EMGALITY 120 MG/ML ~~LOC~~ SOAJ: 120.0000 mg | SUBCUTANEOUS | 11 refills | Status: AC

## 2023-07-12 NOTE — Telephone Encounter (Signed)
 100% improvement in migraines on emgality . Please call for NP med check In one year video please.

## 2023-07-12 NOTE — Telephone Encounter (Signed)
 Spoke with patient and scheduled VV with Jessica for 07/12/24 at 2:30pm
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
## 2023-07-22 ENCOUNTER — Encounter: Payer: Self-pay | Admitting: Advanced Practice Midwife

## 2023-07-24 ENCOUNTER — Other Ambulatory Visit (HOSPITAL_COMMUNITY): Payer: Self-pay | Admitting: Psychiatry

## 2023-07-24 DIAGNOSIS — F250 Schizoaffective disorder, bipolar type: Secondary | ICD-10-CM

## 2023-08-04 ENCOUNTER — Other Ambulatory Visit: Payer: Self-pay

## 2023-08-06 ENCOUNTER — Other Ambulatory Visit: Payer: Self-pay | Admitting: Internal Medicine

## 2023-08-14 ENCOUNTER — Other Ambulatory Visit (HOSPITAL_COMMUNITY): Payer: Self-pay | Admitting: Psychiatry

## 2023-08-14 DIAGNOSIS — F900 Attention-deficit hyperactivity disorder, predominantly inattentive type: Secondary | ICD-10-CM

## 2023-08-14 DIAGNOSIS — F250 Schizoaffective disorder, bipolar type: Secondary | ICD-10-CM

## 2023-08-15 ENCOUNTER — Encounter (HOSPITAL_COMMUNITY): Payer: Self-pay | Admitting: Psychiatry

## 2023-08-15 ENCOUNTER — Telehealth (HOSPITAL_BASED_OUTPATIENT_CLINIC_OR_DEPARTMENT_OTHER): Admitting: Psychiatry

## 2023-08-15 ENCOUNTER — Other Ambulatory Visit (HOSPITAL_COMMUNITY): Payer: Self-pay | Admitting: Psychiatry

## 2023-08-15 VITALS — Wt 112.0 lb

## 2023-08-15 DIAGNOSIS — F250 Schizoaffective disorder, bipolar type: Secondary | ICD-10-CM

## 2023-08-15 DIAGNOSIS — F900 Attention-deficit hyperactivity disorder, predominantly inattentive type: Secondary | ICD-10-CM

## 2023-08-15 DIAGNOSIS — F410 Panic disorder [episodic paroxysmal anxiety] without agoraphobia: Secondary | ICD-10-CM | POA: Diagnosis not present

## 2023-08-15 MED ORDER — DOXEPIN HCL 10 MG PO CAPS: 10.0000 mg | ORAL_CAPSULE | Freq: Every day | ORAL | 2 refills | Status: DC

## 2023-08-15 MED ORDER — ATOMOXETINE HCL 60 MG PO CAPS: 60.0000 mg | ORAL_CAPSULE | Freq: Every day | ORAL | 2 refills | Status: DC

## 2023-08-15 MED ORDER — BUPROPION HCL ER (XL) 150 MG PO TB24: 150.0000 mg | ORAL_TABLET | Freq: Every day | ORAL | 2 refills | Status: DC

## 2023-08-15 MED ORDER — CARBAMAZEPINE 200 MG PO TABS: 200.0000 mg | ORAL_TABLET | Freq: Every day | ORAL | 2 refills | Status: DC

## 2023-08-15 NOTE — Progress Notes (Signed)
 [facility] MD/PA/NP OP Progress Note  Virtual Visit via Video Note  I connected with Ruth Duncan on 08/15/23 at  9:00 AM EDT by a video enabled telemedicine application and verified that I am speaking with the correct person using two identifiers.  Location: Patient: Home Provider: Home Office   I discussed the limitations of evaluation and management by telemedicine and the availability of in person appointments. The patient expressed understanding and agreed to proceed.  08/15/2023 8:47 AM Ruth Duncan  MRN:  [id number]  Chief Complaint:  Chief Complaint  Patient presents with   Follow-up   Medication Refill   HPI: Patient is evaluated by video session.  She is now taking Tegretol  200 mg at bedtime.  She liked the medicine as she able to sleep better but sometimes did not sleep all night.  Overall she feels mood is stable but continued to report getting distracted, inattentive.  She also reported talking to herself which usually calm her down.  She is taking talks again, Strattera , Wellbutrin  and Sinequan .  She denies any major panic attack.  She has good summer.  She took kids to [place]  and now going to [place]  with the friends on Wednesday.  She reported her son is doing better.  She is no longer taking lithium .  We have discontinue lithium  because her level was persistently low.  She reported her energy level is good.  She enjoys the company of the family and friends.  She denies drinking or using any illegal substances.  Her appetite is okay and her weight is unchanged from the past.  She is also taking injection for her headaches and since then she has not taken Imitrex.  Her headaches are somewhat better.  She denies any mania, impulsive behavior.  She denies any paranoia or delusion but admitted sometimes talk to herself.  Visit Diagnosis:    ICD-10-CM   1. Schizoaffective disorder, bipolar type (HCC)  F25.0 carbamazepine  (TEGRETOL ) 200 MG tablet    buPROPion  (WELLBUTRIN  XL) 150 MG 24 hr  tablet    doxepin  (SINEQUAN ) 10 MG capsule    2. Attention deficit hyperactivity disorder (ADHD), predominantly inattentive type  F90.0 atomoxetine  (STRATTERA ) 60 MG capsule    3. Panic attack  F41.0 doxepin  (SINEQUAN ) 10 MG capsule        Past Psychiatric History:  H/O alcohol, cocaine and inpatient at [facility] in 2007 and [facility] however things are now slowly and gradually tried Strattera , Prozac , Trilafon , Abilify, Lamictal , Latuda  and Trazadone. No h/o suicidal attempt.  H/O trouble with the law later probation. H/O paranoia, hallucination, anxiety.   Past Medical History:  Past Medical History:  Diagnosis Date   ADD (attention deficit disorder)    Atherosclerotic cardiovascular disease    Bipolar disorder (HCC)    Chronic abdominal pain    hx of IBS-D, ? bile salt-induced diarrhea   Chronic eczema of hand 2012   BILATERAL ON MTX SINCE 2014   Complication of anesthesia    Diverticulitis    Gastrointestinal hemorrhage    GERD (gastroesophageal reflux disease)    History of kidney stones    Hyperlipidemia    Melena    Ovarian cancer (HCC) 2010   PONV (postoperative nausea and vomiting)    Schizoaffective disorder (HCC)    Surgical menopause 2010   Tubular adenoma 2010    Past Surgical History:  Procedure Laterality Date   ABDOMINAL HYSTERECTOMY  01/05/2008   APPENDECTOMY  01/04/2002   BACTERIAL OVERGROWTH TEST N/A  09/01/2015   Procedure: BACTERIAL OVERGROWTH TEST;  Surgeon: Margo LITTIE Haddock, MD;  [facility];  Service: Endoscopy;  Laterality: N/A;  700   BALLOON DILATION N/A 03/31/2020   Procedure: BALLOON DILATION;  Surgeon: Cindie Carlin POUR, DO;  [facility];  Service: Endoscopy;  Laterality: N/A;   BALLOON DILATION N/A 04/16/2022   Procedure: BALLOON DILATION;  Surgeon: Cindie Carlin POUR, DO;  [facility];  Service: Endoscopy;  Laterality: N/A;   CESAREAN SECTION  01/05/1996   COLONOSCOPY  12/04/2008   tubular  adenomas, negative microscopic colitis. Due for Flex Sig Dec 2015   ESOPHAGOGASTRODUODENOSCOPY  05/06/2008   DOQ:Wnmfjo esophagus without evidence of Barrett's, mass, erosion, ulceration or stricture/ Hiatal hernia/ Normal duodenal bulb and second portion of the duodenum   ESOPHAGOGASTRODUODENOSCOPY N/A 10/04/2014   Dr. Haddock: patent stricture at GE junction, no dilation, small hiatal hernia, mild non-erosive gastritis (benign), multiple small gastric polyps, benign. Capsule study recommended but never completed    ESOPHAGOGASTRODUODENOSCOPY (EGD) WITH PROPOFOL  N/A 04/10/2019   Procedure: ESOPHAGOGASTRODUODENOSCOPY (EGD) WITH PROPOFOL ;  Surgeon: Haddock Margo LITTIE, MD;  [facility];  Service: Endoscopy;  Laterality: N/A;  1:15pm-moved to 4/6 @ 2:45pm   ESOPHAGOGASTRODUODENOSCOPY (EGD) WITH PROPOFOL  N/A 03/31/2020   Surgeon: Cindie Carlin POUR, DO; small hiatal hernia, Schatzki's ring dilated, gastritis biopsied, normal examined duodenum.  Biopsies negative for H. pylori.   ESOPHAGOGASTRODUODENOSCOPY (EGD) WITH PROPOFOL  N/A 04/16/2022   Procedure: ESOPHAGOGASTRODUODENOSCOPY (EGD) WITH PROPOFOL ;  Surgeon: Cindie Carlin POUR, DO;  [facility];  Service: Endoscopy;  Laterality: N/A;  900am, asa 2   ESOPHAGOGASTRODUODENOSCOPY (EGD) WITH PROPOFOL  N/A 10/22/2022   Procedure: ESOPHAGOGASTRODUODENOSCOPY (EGD) WITH PROPOFOL ;  Surgeon: Eartha Angelia Sieving, MD;  [facility];  Service: Gastroenterology;  Laterality: N/A;  9:15AM;ASA 1, moved to 10/18 at 9:30 per Tanya, pt not willing to move up   FLEXIBLE SIGMOIDOSCOPY N/A 02/18/2014   SLF: Normal small bowel. formed stool at anastomosis. Stool cleared with irrigation. One superfical erosion at the anastomosis. otherwise normal. normal rectal mucosa   SAVORY DILATION N/A 04/10/2019   Procedure: SAVORY DILATION;  Surgeon: Haddock Margo LITTIE, MD;  [facility];  Service: Endoscopy;  Laterality: N/A;   SAVORY DILATION   10/22/2022   Procedure: SAVORY DILATION;  Surgeon: Eartha Angelia, Sieving, MD;  [facility];  Service: Gastroenterology;;   SUBTOTAL COLECTOMY  01/04/2009   TUBAL LIGATION      Family Psychiatric History: Reviewed  Family History:  Family History  Problem Relation Age of Onset   Hypertension Mother    Kidney disease Father    Hypertension Father    Coronary artery disease Father    Breast cancer Paternal Grandmother        post menopausal   Alcohol abuse Paternal Grandfather    Colon cancer Neg Hx     Social History:  Social History   Socioeconomic History   Marital status: Legally Separated    Spouse name: Not on file   Number of children: Not on file   Years of education: Not on file   Highest education level: Not on file  Occupational History   Occupation: Disability    Comment: since 2010  Tobacco Use   Smoking status: Former    Current packs/day: 0.00    Average packs/day: 1 pack/day for 30.0 years (30.0 ttl pk-yrs)    Types: Cigarettes    Start date: 11/05/1982    Quit date: 11/04/2012  Years since quitting: 10.7   Smokeless tobacco: Never  Vaping Use   Vaping status: Never Used  Substance and Sexual Activity   Alcohol use: No    Alcohol/week: 0.0 standard drinks of alcohol   Drug use: No   Sexual activity: Yes    Partners: Male    Birth control/protection: None  Other Topics Concern   Not on file  Social History Narrative   ** Merged History Encounter **       Gwendlyon was born and grew up in [place], [place] . Her parents divorced when she was 34 years of age, and she grew up with her paternal grandmother and grandfather. Her grandfather was an alcoholic, and Keerstin found him dead of an MI at the age of 19. Her mother used to lock her in a dark bathroom for an hour or so very frequently, and Ladawn continues to have phobias associated with opening a bathroom door. Elaiza graduated from [employer], but has had no real career because she was  unable to hold a job. She has been married for 22 years and has 3 boys, twins age 22 and her oldest son age 77. 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 She is currently attending Alcoholics Anonymous meetings 3 times a week, has a sponsor, and is working the 12 steps.   Social Drivers of Corporate investment banker Strain: Not on file  Food Insecurity: No Food Insecurity (04/14/2023)   Received from [facility]   Hunger Vital Sign    Within the past 12 months, you worried that your food would run out before you got the money to buy more.: Never true    Within the past 12 months, the food you bought just didn't last and you didn't have money to get more.: Never true  Transportation Needs: No Transportation Needs (04/14/2023)   Received from [facility] - Transportation    Lack of Transportation (Medical): No    Lack of Transportation (Non-Medical): No  Physical Activity: Sufficiently Active (04/14/2023)   Received from [facility]   Exercise Vital Sign    On average, how many days per week do you engage in moderate to strenuous exercise (like a brisk walk)?: 3 days    On average, how many minutes do you engage in exercise at this level?: 60 min  Stress: No Stress Concern Present (01/28/2022)   Received from [facility] - Occupational Stress Questionnaire    Feeling of Stress : Not at all  Social Connections: Unknown (05/17/2021)   Received from [facility]   Social Network    Social Network: Not on file    Allergies: No Known Allergies  Metabolic Disorder Labs: Lab Results  Component Value Date   HGBA1C 5.4 10/23/2019   MPG 108 10/23/2019   MPG 105 10/02/2018   No results found for: PROLACTIN Lab Results  Component Value Date   CHOL 178 05/03/2023   TRIG 84 05/03/2023   HDL 57 05/03/2023   CHOLHDL 3.1 05/03/2023   LDLCALC 105 (H) 05/03/2023   LDLCALC 105 (H)  12/09/2022   Lab Results  Component Value Date   TSH 1.52 07/17/2021   TSH 1.52 10/23/2019    Therapeutic Level Labs: Lab Results  Component Value Date   LITHIUM  <0.1 (L) 05/03/2023   LITHIUM  <0.1 (L) 01/22/2022   No results found for: VALPROATE No  results found for: CBMZ  Current Medications: Current Outpatient Medications  Medication Sig Dispense Refill   ALPRAZolam  (XANAX ) 0.25 MG tablet Take 1 tablet (0.25 mg total) by mouth daily as needed. for anxiety 20 tablet 0   ALPRAZolam  (XANAX ) 0.25 MG tablet Take 1-2 tabs (0.25mg -0.50mg ) 30-60 minutes before procedure. May repeat if needed.Do not drive. 4 tablet 0   atomoxetine  (STRATTERA ) 60 MG capsule Take 1 capsule (60 mg total) by mouth daily. TAKE 1 CAPSULE BY MOUTH EVERY DAY 30 capsule 1   Bempedoic Acid-Ezetimibe (NEXLIZET ) 180-10 MG TABS Take 1 tablet by mouth daily at 6 (six) AM. 30 tablet 11   buPROPion  (WELLBUTRIN  XL) 150 MG 24 hr tablet Take 1 tablet (150 mg total) by mouth daily. 30 tablet 1   carbamazepine  (TEGRETOL ) 200 MG tablet Take 1 tablet (200 mg total) by mouth at bedtime. 30 tablet 1   dexlansoprazole  (DEXILANT ) 60 MG capsule Take 1 capsule (60 mg total) by mouth daily. 90 capsule 3   dicyclomine  (BENTYL ) 10 MG capsule TAKE 1 CAPSULE (10 MG TOTAL) BY MOUTH 3 (THREE) TIMES DAILY BEFORE MEALS. AS NEEDED 270 capsule 1   doxepin  (SINEQUAN ) 10 MG capsule Take 1 capsule (10 mg total) by mouth at bedtime. 30 capsule 1   estradiol (ESTRACE) 0.1 MG/GM vaginal cream Place 1 g vaginally at bedtime.     Evolocumab  (REPATHA  SURECLICK) 140 MG/ML SOAJ Inject 140 mg into the skin every 14 (fourteen) days. 2 mL 11   Galcanezumab -gnlm (EMGALITY ) 120 MG/ML SOAJ Inject 120 mg into the skin every 30 (thirty) days. 1.12 mL 11   lithium  300 MG tablet TAKE 2 TABLET IN AM, 2 TABLET AT NOON AND 2 TABLET IN EVENING 180 tablet 0   nitrofurantoin (MACRODANTIN) 50 MG capsule Take 50 mg by mouth daily as needed (UTI).  5   nitroGLYCERIN   (NITROSTAT ) 0.4 MG SL tablet Place 0.4 mg under the tongue every 5 (five) minutes as needed for chest pain.     ondansetron  (ZOFRAN ) 4 MG tablet Take 1 tablet (4 mg total) by mouth every 6 (six) hours as needed for nausea or vomiting. 30 tablet 0   rosuvastatin  (CRESTOR ) 10 MG tablet Take 1 tablet (10 mg total) by mouth daily. 90 tablet 3   rosuvastatin  (CRESTOR ) 10 MG tablet Take 1 tablet (10 mg total) by mouth daily. 90 tablet 3   SUMAtriptan (IMITREX) 25 MG tablet daily at 6 (six) AM.     valACYclovir (VALTREX) 500 MG tablet Take 500 mg by mouth daily as needed (outbreaks).     No current facility-administered medications for this visit.     Musculoskeletal: Strength & Muscle Tone: within normal limits Gait & Station: normal Patient leans: N/A Psychiatric Specialty Exam: Physical Exam  Review of Systems  Weight 112 lb (50.8 kg).There is no height or weight on file to calculate BMI.  General Appearance: Casual  Eye Contact:  Good  Speech:  fast  Volume:  Normal  Mood:  Anxious  Affect:  Appropriate  Thought Process:  Descriptions of Associations: Intact  Orientation:  Full (Time, Place, and Person)  Thought Content:  Some time talk to herself but denies any delusions or paranoia.  Suicidal Thoughts:  No  Homicidal Thoughts:  No  Memory:  Immediate;   Good Recent;   Good Remote;   Fair  Judgement:  Intact  Insight:  Present  Psychomotor Activity:  Increased  Concentration:  Concentration: Fair and Attention Span: Fair  Recall:  [employer]  of Knowledge:  Good  Language:  Good  Akathisia:  No  Handed:  Right  AIMS (if indicated):     Assets:  Communication Skills Desire for Improvement Housing Social Support Talents/Skills Transportation  ADL's:  Intact  Cognition:  WNL  Sleep:   fair   Screenings: GAD-7    Flowsheet Row Video [facility]28/20[facility] in [facility]  Total GAD-7 Score 3   PHQ2-9    Flowsheet Row Video  Visit from 02/28/2023 in [facility]  PHQ-2 Total Score 0   Flowsheet Row Admission (Discharged) from 10/22/2022 in [facility] Pre-Admission Testing Visit from 10/19/2022 in [place] [facility] Admission (Discharged) from 04/16/2022 in [place] [facility]  C-SSRS RISK CATEGORY No Risk No Risk No Risk     Assessment and Plan: Patient is 53 year old female with history of coronary artery disease, IBS, GERD and hyperlipidemia.  Patient not taking Tegretol  200 mg at bedtime.  Discussed residual anxiety, attention, focus and talking to herself.  Usually talking to herself calm her down.  She is concerned if she is on vacation and talking to herself get worse when she like to address that issue.  I have offered adjusting her dose of Tegretol  or starting a low-dose antipsychotic medication but patient does not want to change or adjust the dose at this time.  She feels the medicine seems to be working most at bedtime.  She does not want to gain weight and so far comfortable with the current medication.  She has mild tremors but stable.  Continue Wellbutrin  XL 150 mg in the morning, Strattera  60 mg daily, doxepin  10 mg at bedtime and she will keep the Tegretol  200 mg at bedtime however if needed we will increase the dose of Tegretol .  Recommend to call back any question or concern or if she feels worsening the symptom.  She like to have a follow-up in 3 months.  We will do Tegretol  level on her next appointment.  Collaboration of Care: Collaboration of Care: Other provider involved in patient's care AEB notes are available in epic to review  Patient/Guardian was advised Release of Information must be obtained prior to any record release in order to collaborate their care with an outside provider. Patient/Guardian was advised if they have not already done so to contact the registration department to sign all necessary forms in order for us  to release  information regarding their care.   Consent: Patient/Guardian gives verbal consent for treatment and assignment of benefits for services provided during this visit. Patient/Guardian expressed understanding and agreed to proceed.    Follow Up Instructions:    I discussed the assessment and treatment plan with the patient. The patient was provided an opportunity to ask questions and all were answered. The patient agreed with the plan and demonstrated an understanding of the instructions.   The patient was advised to call back or seek an in-person evaluation if the symptoms worsen or if the condition fails to improve as anticipated.  I provided 26 minutes of non-face-to-face time during this encounter.   Leni ONEIDA Client, MD  Leni ONEIDA Client, MD 08/15/2023, 8:47 AM

## 2023-08-29 ENCOUNTER — Other Ambulatory Visit (HOSPITAL_COMMUNITY): Payer: Self-pay

## 2023-08-30 ENCOUNTER — Other Ambulatory Visit (HOSPITAL_COMMUNITY): Payer: Self-pay

## 2023-08-31 ENCOUNTER — Other Ambulatory Visit (HOSPITAL_COMMUNITY): Payer: Self-pay

## 2023-08-31 ENCOUNTER — Telehealth: Payer: Self-pay | Admitting: Pharmacy Technician

## 2023-08-31 NOTE — Telephone Encounter (Signed)
   Pharmacy Patient Advocate Encounter   Received notification from Onbase that prior authorization for repatha  is required/requested.   Insurance verification completed.   The patient is insured through [employer] [facility] .   Per test claim: PA required; PA submitted to above mentioned insurance via Latent Key/confirmation #/EOC [id number] Status is pending

## 2023-08-31 NOTE — Telephone Encounter (Signed)
 Pharmacy Patient Advocate Encounter  Received notification from [employer] that Prior Authorization for repatha  has been APPROVED from 08/31/23 to 01/04/24   PA #/Case ID/Reference #: [id number]   I asked our pharmacy team to fill for WL -0.00 copay shipping today 08/31/23

## 2023-09-16 ENCOUNTER — Other Ambulatory Visit: Payer: Self-pay | Admitting: Gastroenterology

## 2023-09-16 DIAGNOSIS — R195 Other fecal abnormalities: Secondary | ICD-10-CM

## 2023-10-03 NOTE — Telephone Encounter (Signed)
 Patient is requesting the following refill Requested Prescriptions   Pending Prescriptions Disp Refills  . nitrofurantoin (MACRODANTIN) 50 MG capsule [Pharmacy Med Name: NITROFURANTOIN MCR 50 MG CAP] 30 capsule 0    Sig: TAKE 1 CAPSULE BY MOUTH EVERY DAY NIGHT    Recent Visits Date Type Provider Dept  07/11/23 Office Visit [place], Malachi Piety, MD [facility]  05/16/23 Office Visit [place], Malachi Piety, MD [facility]  04/14/23 Office Visit [place], Malachi Piety, MD [facility]  Showing recent visits within past 365 days and meeting all other requirements Future Appointments No visits were found meeting these conditions. Showing future appointments within next 365 days and meeting all other requirements    Labs:

## 2023-10-19 ENCOUNTER — Encounter (INDEPENDENT_AMBULATORY_CARE_PROVIDER_SITE_OTHER): Payer: Self-pay | Admitting: Gastroenterology

## 2023-11-09 ENCOUNTER — Other Ambulatory Visit (HOSPITAL_COMMUNITY): Payer: Self-pay | Admitting: Psychiatry

## 2023-11-09 DIAGNOSIS — F250 Schizoaffective disorder, bipolar type: Secondary | ICD-10-CM

## 2023-11-09 DIAGNOSIS — F900 Attention-deficit hyperactivity disorder, predominantly inattentive type: Secondary | ICD-10-CM

## 2023-11-14 ENCOUNTER — Encounter (HOSPITAL_COMMUNITY): Payer: Self-pay | Admitting: Psychiatry

## 2023-11-14 ENCOUNTER — Telehealth (HOSPITAL_BASED_OUTPATIENT_CLINIC_OR_DEPARTMENT_OTHER): Admitting: Psychiatry

## 2023-11-14 VITALS — Wt 100.0 lb

## 2023-11-14 DIAGNOSIS — F250 Schizoaffective disorder, bipolar type: Secondary | ICD-10-CM

## 2023-11-14 DIAGNOSIS — F410 Panic disorder [episodic paroxysmal anxiety] without agoraphobia: Secondary | ICD-10-CM | POA: Diagnosis not present

## 2023-11-14 DIAGNOSIS — F900 Attention-deficit hyperactivity disorder, predominantly inattentive type: Secondary | ICD-10-CM

## 2023-11-14 MED ORDER — BUPROPION HCL ER (XL) 150 MG PO TB24: 150.0000 mg | ORAL_TABLET | Freq: Every day | ORAL | 1 refills | Status: DC

## 2023-11-14 MED ORDER — ATOMOXETINE HCL 60 MG PO CAPS: 60.0000 mg | ORAL_CAPSULE | Freq: Every day | ORAL | 1 refills | Status: DC

## 2023-11-14 MED ORDER — DOXEPIN HCL 10 MG PO CAPS: 10.0000 mg | ORAL_CAPSULE | Freq: Every day | ORAL | 0 refills | Status: DC

## 2023-11-14 MED ORDER — CARBAMAZEPINE 200 MG PO TABS: 200.0000 mg | ORAL_TABLET | Freq: Two times a day (BID) | ORAL | 0 refills | Status: DC

## 2023-11-14 NOTE — Progress Notes (Signed)
 [facility] MD Virtual Progress Note   Patient Location: Home Provider Location: Home Office  I connect with patient by Video and verified that I am speaking with correct person by using two identifiers. I discussed the limitations of evaluation and management by telemedicine and the availability of in person appointments. I also discussed with the patient that there may be a patient responsible charge related to this service. The patient expressed understanding and agreed to proceed.  Ruth Duncan [id number] 53 y.o.  11/14/2023 8:26 AM  History of Present Illness:  Patient is evaluated by video session.  She reported a lot of anxiety and nervousness.  Lately she is trying to give 1 acre land to her son and going through a lot of legal battles but hoping it will close in few days.  She is also stressed about her ex-husband as she find out that he is marrying with his girlfriend but never asked to have a final divorce from the patient.  She is very upset because she is in touch with her ex a lot of things but got shock when she find out through her son that he is getting married and moving to [place] .  She is also very anxious about her general health.  Recently unexplained more than 15 pound weight loss and having nodules on the right side of neck.  She is seeing rheumatologist and primary care.  She admitted a lot of irritability, frustration, anger.  She is having a lot of crying spells.  Patient told she is married to her husband for more than 30 years and they are not living together and she had no issue giving him divorce but he should have discussed with her on this issue before making that decision.  Patient now embarrassed because she is getting a lot of messages from the friend's about asked getting his ex getting married.  She is also upset because kids are very stressed.  She is not sleeping well.  She denies any suicidal thoughts or homicidal thoughts but appears very  anxious.  She denies any hallucinations or paranoia.  She is taking doxepin  10 mg at bedtime, Tegretol  200 mg at bedtime, Wellbutrin  XL 150 mg in the morning and Strattera  60 mg.  She struggles with attention, focus.  She has prescribed Xanax  more than 6 months ago and sometimes she takes but does not feel it working and she do not like it.  She is wondering if she can try something else to help her nerve calm.  She is having panic attacks.  She had a good support from her friends.  She is going to have a MRI of the neck because she is very concerned about these abnormal findings.  Past Psychiatric History: H/O alcohol, cocaine and inpatient at [facility] in 2007 and rehabilitation at [facility] however things are now slowly and gradually tried Strattera , Prozac , Trilafon , Abilify, Lamictal , Latuda  and Trazadone. No h/o suicidal attempt.  H/O trouble with the law later probation. H/O paranoia, hallucination, anxiety.   Past Medical History:  Diagnosis Date   ADD (attention deficit disorder)    Atherosclerotic cardiovascular disease    Bipolar disorder (HCC)    Chronic abdominal pain    hx of IBS-D, ? bile salt-induced diarrhea   Chronic eczema of hand 2012   BILATERAL ON MTX SINCE 2014   Complication of anesthesia    Diverticulitis    Gastrointestinal hemorrhage    GERD (gastroesophageal reflux disease)    History of kidney  stones    Hyperlipidemia    Melena    Ovarian cancer (HCC) 2010   PONV (postoperative nausea and vomiting)    Schizoaffective disorder (HCC)    Surgical menopause 2010   Tubular adenoma 2010    Outpatient Encounter Medications as of 11/14/2023  Medication Sig   ALPRAZolam  (XANAX ) 0.25 MG tablet Take 1 tablet (0.25 mg total) by mouth daily as needed. for anxiety   ALPRAZolam  (XANAX ) 0.25 MG tablet Take 1-2 tabs (0.25mg -0.50mg ) 30-60 minutes before procedure. May repeat if needed.Do not drive.   atomoxetine  (STRATTERA ) 60 MG capsule Take 1 capsule (60 mg total) by mouth  daily. TAKE 1 CAPSULE BY MOUTH EVERY DAY   Bempedoic Acid-Ezetimibe (NEXLIZET ) 180-10 MG TABS Take 1 tablet by mouth daily at 6 (six) AM.   buPROPion  (WELLBUTRIN  XL) 150 MG 24 hr tablet Take 1 tablet (150 mg total) by mouth daily.   carbamazepine  (TEGRETOL ) 200 MG tablet Take 1 tablet (200 mg total) by mouth at bedtime.   dexlansoprazole  (DEXILANT ) 60 MG capsule Take 1 capsule (60 mg total) by mouth daily.   dicyclomine  (BENTYL ) 10 MG capsule TAKE 1 CAPSULE (10 MG TOTAL) BY MOUTH 3 (THREE) TIMES DAILY BEFORE MEALS. AS NEEDED   doxepin  (SINEQUAN ) 10 MG capsule Take 1 capsule (10 mg total) by mouth at bedtime.   estradiol (ESTRACE) 0.1 MG/GM vaginal cream Place 1 g vaginally at bedtime.   Evolocumab  (REPATHA  SURECLICK) 140 MG/ML SOAJ Inject 140 mg into the skin every 14 (fourteen) days.   Galcanezumab -gnlm (EMGALITY ) 120 MG/ML SOAJ Inject 120 mg into the skin every 30 (thirty) days.   nitrofurantoin (MACRODANTIN) 50 MG capsule Take 50 mg by mouth daily as needed (UTI).   nitroGLYCERIN  (NITROSTAT ) 0.4 MG SL tablet Place 0.4 mg under the tongue every 5 (five) minutes as needed for chest pain.   ondansetron  (ZOFRAN ) 4 MG tablet Take 1 tablet (4 mg total) by mouth every 6 (six) hours as needed for nausea or vomiting.   rosuvastatin  (CRESTOR ) 10 MG tablet Take 1 tablet (10 mg total) by mouth daily.   rosuvastatin  (CRESTOR ) 10 MG tablet Take 1 tablet (10 mg total) by mouth daily.   valACYclovir (VALTREX) 500 MG tablet Take 500 mg by mouth daily as needed (outbreaks).   No facility-administered encounter medications on file as of 11/14/2023.    No results found for this or any previous visit (from the past 2160 hours).   Psychiatric Specialty Exam: Physical Exam  Review of Systems  Constitutional:  Positive for activity change and fatigue.  HENT:         Nodules on the right side of the neck  Psychiatric/Behavioral:  The patient is nervous/anxious.     Weight 100 lb (45.4 kg).There is no height  or weight on file to calculate BMI.  General Appearance: Casual  Eye Contact:  Good  Speech:  Slow  Volume:  Increased  Mood:  Anxious  Affect:  Appropriate  Thought Process:  Goal Directed  Orientation:  Full (Time, Place, and Person)  Thought Content:  Rumination  Suicidal Thoughts:  No  Homicidal Thoughts:  No  Memory:  Immediate;   Good Recent;   Good Remote;   Good  Judgement:  Intact  Insight:  Present  Psychomotor Activity:  Normal  Concentration:  Concentration: Fair and Attention Span: Fair  Recall:  Good  Fund of Knowledge:  Good  Language:  Good  Akathisia:  Yes  Handed:  Right  AIMS (if indicated):  Assets:  Communication Skills Desire for Improvement Housing Resilience Social Support Transportation  ADL's:  Intact  Cognition:  WNL  Sleep:  fair       02/28/2023    3:31 PM  Depression screen PHQ 2/9  Decreased Interest 0  Down, Depressed, Hopeless 0  PHQ - 2 Score 0    Assessment/Plan: Schizoaffective disorder, bipolar type (HCC) - Plan: buPROPion  (WELLBUTRIN  XL) 150 MG 24 hr tablet, carbamazepine  (TEGRETOL ) 200 MG tablet, doxepin  (SINEQUAN ) 10 MG capsule  Attention deficit hyperactivity disorder (ADHD), predominantly inattentive type - Plan: atomoxetine  (STRATTERA ) 60 MG capsule  Panic attack - Plan: doxepin  (SINEQUAN ) 10 MG capsule  Patient is 53 year old currently separated with history of coronary artery disease, IBS, GERD, hyperlipidemia and recently enlarged nodules on the right side of neck and unexplained weight loss.  Discussed psychosocial stressors, general health concerns.  She is in the process of getting further workup.  She has MRI coming up and she is going to talk to the doctor next week.  She is not taking Xanax  because it does not help her panic attack.  She feels nervous and anxious and irritable most of the time.  She is upset on her ex.  I recommend to have Tegretol  level.  She did not have level in a while.  She used to be on  lithium  but historically always subtherapeutic level and we switched to Tegretol .  Recommend to try Tegretol  200 mg twice a day and do the level after 5 days.  Will keep the Strattera  60 mg daily, Wellbutrin  XL 150 mg in the morning and doxepin  10 mg at bedtime.  She started therapy at [facility] with Mr. Sharolyn.  I encouraged to keep the appointment.  We will also order Tegretol  level with CBC and hemoglobin A1c.  Will follow-up in 4 weeks.  Discussed safety concerns at any time having suicidal thoughts or homicidal call that she need to call 911 or go to local emergency room.   Follow Up Instructions:     I discussed the assessment and treatment plan with the patient. The patient was provided an opportunity to ask questions and all were answered. The patient agreed with the plan and demonstrated an understanding of the instructions.   The patient was advised to call back or seek an in-person evaluation if the symptoms worsen or if the condition fails to improve as anticipated.    Collaboration of Care: Other provider involved in patient's care AEB notes are available in epic to review  Patient/Guardian was advised Release of Information must be obtained prior to any record release in order to collaborate their care with an outside provider. Patient/Guardian was advised if they have not already done so to contact the registration department to sign all necessary forms in order for us  to release information regarding their care.   Consent: Patient/Guardian gives verbal consent for treatment and assignment of benefits for services provided during this visit. Patient/Guardian expressed understanding and agreed to proceed.     Total encounter time 28 minutes which includes face-to-face time, chart reviewed, care coordination, order entry and documentation during this encounter.   Note: This document was prepared by [employer] voice dictation technology and any errors that results from this process  are unintentional.    Leni ONEIDA Client, MD 11/14/2023

## 2023-11-28 NOTE — Telephone Encounter (Signed)
 Patient is requesting the following refill Requested Prescriptions   Pending Prescriptions Disp Refills  . nitrofurantoin (MACRODANTIN) 50 MG capsule [Pharmacy Med Name: NITROFURANTOIN MCR 50 MG CAP] 30 capsule 0    Sig: TAKE 1 CAPSULE BY MOUTH EVERY DAY NIGHT    Recent Visits Date Type Provider Dept  07/11/23 Office Visit [place], Malachi Piety, MD [facility]  05/16/23 Office Visit [place], Malachi Piety, MD [facility]  04/14/23 Office Visit [place], Malachi Piety, MD [facility]  Showing recent visits within past 365 days and meeting all other requirements Future Appointments No visits were found meeting these conditions. Showing future appointments within next 365 days and meeting all other requirements    Labs:

## 2023-12-10 ENCOUNTER — Other Ambulatory Visit (HOSPITAL_COMMUNITY): Payer: Self-pay | Admitting: Psychiatry

## 2023-12-10 DIAGNOSIS — F250 Schizoaffective disorder, bipolar type: Secondary | ICD-10-CM

## 2023-12-13 ENCOUNTER — Other Ambulatory Visit: Payer: Self-pay | Admitting: Physician Assistant

## 2023-12-13 ENCOUNTER — Telehealth (HOSPITAL_COMMUNITY): Admitting: Psychiatry

## 2023-12-13 ENCOUNTER — Encounter (HOSPITAL_COMMUNITY): Payer: Self-pay | Admitting: Psychiatry

## 2023-12-13 VITALS — Wt 98.0 lb

## 2023-12-13 DIAGNOSIS — F250 Schizoaffective disorder, bipolar type: Secondary | ICD-10-CM

## 2023-12-13 DIAGNOSIS — F410 Panic disorder [episodic paroxysmal anxiety] without agoraphobia: Secondary | ICD-10-CM

## 2023-12-13 DIAGNOSIS — F900 Attention-deficit hyperactivity disorder, predominantly inattentive type: Secondary | ICD-10-CM

## 2023-12-13 DIAGNOSIS — Z1231 Encounter for screening mammogram for malignant neoplasm of breast: Secondary | ICD-10-CM

## 2023-12-13 MED ORDER — BUPROPION HCL ER (XL) 150 MG PO TB24: 150.0000 mg | ORAL_TABLET | Freq: Every day | ORAL | 2 refills | Status: AC

## 2023-12-13 MED ORDER — CARBAMAZEPINE 200 MG PO TABS: 200.0000 mg | ORAL_TABLET | Freq: Two times a day (BID) | ORAL | 2 refills | Status: AC

## 2023-12-13 MED ORDER — ATOMOXETINE HCL 60 MG PO CAPS: 60.0000 mg | ORAL_CAPSULE | Freq: Every day | ORAL | 2 refills | Status: AC

## 2023-12-13 MED ORDER — DOXEPIN HCL 10 MG PO CAPS: 10.0000 mg | ORAL_CAPSULE | Freq: Every day | ORAL | 2 refills | Status: AC

## 2023-12-13 NOTE — Progress Notes (Signed)
 [facility] MD Virtual Progress Note   Patient Location: In car Provider Location: Home Office  I connect with patient by Video and verified that I am speaking with correct person by using two identifiers. I discussed the limitations of evaluation and management by telemedicine and the availability of in person appointments. I also discussed with the patient that there may be a patient responsible charge related to this service. The patient expressed understanding and agreed to proceed.  Ruth Duncan [id number] 53 y.o.  12/13/2023 11:00 AM  History of Present Illness:  Patient is evaluated by video session.  She is actually doing better since Tegretol  increased and she is taking now 200 mg twice a day.  She forgot to do the blood work but promised to have it done soon.  She is also doing better physically as neck swelling is not as intense and she was told it is not cancerous but she still need to do further workup.  She has no more further weight loss other than 1 pound since her last visit.  Patient reported still family stress as ex not cooperating when it comes to help the son financially.  She is pleased that able to keep the granddaughter on the holidays.  She gets sometimes irritable and frustrated but denies any major panic attack.  She sleeps okay.  She is at work today.  She has no tremors, shakes or any EPS.  She denies any active or passive suicidal thoughts or homicidal thoughts.  She like to keep the current medicine which is helping her mood, focus, attention, sleep and paranoia.  She has not had hallucination or paranoia in a while.  She has not taken Xanax  since earlier this year.  Her panic attacks are not as intense and she is managing situation better than she anticipated.  Past Psychiatric History: H/O alcohol, cocaine and inpatient at [facility] in 2007 and rehabilitation at [facility] however things are now slowly and gradually tried Strattera , Prozac , Trilafon ,  Abilify, Lamictal , Latuda  and Trazadone. No h/o suicidal attempt.  H/O trouble with the law later probation. H/O paranoia, hallucination, anxiety.   Past Medical History:  Diagnosis Date   ADD (attention deficit disorder)    Atherosclerotic cardiovascular disease    Bipolar disorder (HCC)    Chronic abdominal pain    hx of IBS-D, ? bile salt-induced diarrhea   Chronic eczema of hand 2012   BILATERAL ON MTX SINCE 2014   Complication of anesthesia    Diverticulitis    Gastrointestinal hemorrhage    GERD (gastroesophageal reflux disease)    History of kidney stones    Hyperlipidemia    Melena    Ovarian cancer (HCC) 2010   PONV (postoperative nausea and vomiting)    Schizoaffective disorder (HCC)    Surgical menopause 2010   Tubular adenoma 2010    Outpatient Encounter Medications as of 12/13/2023  Medication Sig   ALPRAZolam  (XANAX ) 0.25 MG tablet Take 1 tablet (0.25 mg total) by mouth daily as needed. for anxiety   ALPRAZolam  (XANAX ) 0.25 MG tablet Take 1-2 tabs (0.25mg -0.50mg ) 30-60 minutes before procedure. May repeat if needed.Do not drive.   atomoxetine  (STRATTERA ) 60 MG capsule Take 1 capsule (60 mg total) by mouth daily. TAKE 1 CAPSULE BY MOUTH EVERY DAY   Bempedoic Acid-Ezetimibe (NEXLIZET ) 180-10 MG TABS Take 1 tablet by mouth daily at 6 (six) AM.   buPROPion  (WELLBUTRIN  XL) 150 MG 24 hr tablet Take 1 tablet (150 mg total) by mouth daily.  carbamazepine  (TEGRETOL ) 200 MG tablet Take 1 tablet (200 mg total) by mouth 2 (two) times daily.   dexlansoprazole  (DEXILANT ) 60 MG capsule Take 1 capsule (60 mg total) by mouth daily.   dicyclomine  (BENTYL ) 10 MG capsule TAKE 1 CAPSULE (10 MG TOTAL) BY MOUTH 3 (THREE) TIMES DAILY BEFORE MEALS. AS NEEDED   doxepin  (SINEQUAN ) 10 MG capsule Take 1 capsule (10 mg total) by mouth at bedtime.   estradiol (ESTRACE) 0.1 MG/GM vaginal cream Place 1 g vaginally at bedtime.   Evolocumab  (REPATHA  SURECLICK) 140 MG/ML SOAJ Inject 140 mg into the  skin every 14 (fourteen) days.   Galcanezumab -gnlm (EMGALITY ) 120 MG/ML SOAJ Inject 120 mg into the skin every 30 (thirty) days.   nitrofurantoin (MACRODANTIN) 50 MG capsule Take 50 mg by mouth daily as needed (UTI).   nitroGLYCERIN  (NITROSTAT ) 0.4 MG SL tablet Place 0.4 mg under the tongue every 5 (five) minutes as needed for chest pain.   ondansetron  (ZOFRAN ) 4 MG tablet Take 1 tablet (4 mg total) by mouth every 6 (six) hours as needed for nausea or vomiting.   rosuvastatin  (CRESTOR ) 10 MG tablet Take 1 tablet (10 mg total) by mouth daily.   rosuvastatin  (CRESTOR ) 10 MG tablet Take 1 tablet (10 mg total) by mouth daily.   valACYclovir (VALTREX) 500 MG tablet Take 500 mg by mouth daily as needed (outbreaks).   No facility-administered encounter medications on file as of 12/13/2023.    No results found for this or any previous visit (from the past 2160 hours).   Psychiatric Specialty Exam: Physical Exam  Review of Systems  Constitutional:  Positive for activity change and fatigue.  HENT:         Nodules on the right side of the neck  Psychiatric/Behavioral:  The patient is nervous/anxious.     There were no vitals taken for this visit.There is no height or weight on file to calculate BMI.  General Appearance: Casual  Eye Contact:  Good  Speech:  Slow  Volume:  Increased  Mood:  Anxious  Affect:  Appropriate  Thought Process:  Goal Directed  Orientation:  Full (Time, Place, and Person)  Thought Content:  Rumination  Suicidal Thoughts:  No  Homicidal Thoughts:  No  Memory:  Immediate;   Good Recent;   Good Remote;   Good  Judgement:  Intact  Insight:  Present  Psychomotor Activity:  Normal  Concentration:  Concentration: Fair and Attention Span: Fair  Recall:  Good  Fund of Knowledge:  Good  Language:  Good  Akathisia:  Yes  Handed:  Right  AIMS (if indicated):     Assets:  Communication Skills Desire for Improvement Housing Resilience Social Support Transportation   ADL's:  Intact  Cognition:  WNL  Sleep:  fair       02/28/2023    3:31 PM  Depression screen PHQ 2/9  Decreased Interest 0  Down, Depressed, Hopeless 0  PHQ - 2 Score 0    Assessment/Plan: Schizoaffective disorder, bipolar type (HCC) - Plan: carbamazepine  (TEGRETOL ) 200 MG tablet, doxepin  (SINEQUAN ) 10 MG capsule, buPROPion  (WELLBUTRIN  XL) 150 MG 24 hr tablet  Panic attack - Plan: doxepin  (SINEQUAN ) 10 MG capsule  Attention deficit hyperactivity disorder (ADHD), predominantly inattentive type - Plan: atomoxetine  (STRATTERA ) 60 MG capsule  Patient is 53 year old separated female with history of coronary artery disease, IBS, hyperlipidemia, schizoaffective disorder, ADHD, panic attacks.  Discussed psychosocial stressor.  Patient handling better than she anticipated.  She like to increase Tegretol  which  is now 200 mg twice a day.  She forgot to do the blood work but promised to have it done very soon.  Her physical condition is also much better and she feels her neck swelling is not as bad especially she was told it is not cancerous.  Her energy level is good.  She is in therapy with Mr. Sharolyn every week and that has been helpful.  She does not want to change the medication.  Continue Tegretol  200 mg twice a day, Sinequan  10 mg at bedtime, Strattera  60 mg daily, Wellbutrin  XL 150 mg in the morning.  Encouraged to continue therapy at [facility] with Mr. Sharolyn.  Emphasis to do blood work including Tegretol  level.  Follow-up in 3 months.  Follow Up Instructions:     I discussed the assessment and treatment plan with the patient. The patient was provided an opportunity to ask questions and all were answered. The patient agreed with the plan and demonstrated an understanding of the instructions.   The patient was advised to call back or seek an in-person evaluation if the symptoms worsen or if the condition fails to improve as anticipated.    Collaboration of Care: Other provider  involved in patient's care AEB notes are available in epic to review  Patient/Guardian was advised Release of Information must be obtained prior to any record release in order to collaborate their care with an outside provider. Patient/Guardian was advised if they have not already done so to contact the registration department to sign all necessary forms in order for us  to release information regarding their care.   Consent: Patient/Guardian gives verbal consent for treatment and assignment of benefits for services provided during this visit. Patient/Guardian expressed understanding and agreed to proceed.     Total encounter time 18 minutes which includes face-to-face time, chart reviewed, care coordination, order entry and documentation during this encounter.   Note: This document was prepared by [employer] voice dictation technology and any errors that results from this process are unintentional.    Leni ONEIDA Client, MD 12/13/2023

## 2023-12-14 ENCOUNTER — Other Ambulatory Visit (HOSPITAL_COMMUNITY): Payer: Self-pay

## 2023-12-14 DIAGNOSIS — Z5181 Encounter for therapeutic drug level monitoring: Secondary | ICD-10-CM

## 2023-12-15 ENCOUNTER — Telehealth: Payer: Self-pay | Admitting: Pharmacist

## 2023-12-15 NOTE — Telephone Encounter (Signed)
 Pharmacy Patient Advocate Encounter  Received notification from [facility] that Prior Authorization for Galcanezumab -gnlm (EMGALITY ) 120 MG/ML SOAJ has been APPROVED from 12/15/2023 to 01/03/2025   PA #/Case ID/Reference #: [id number]

## 2023-12-15 NOTE — Telephone Encounter (Signed)
 Pharmacy Patient Advocate Encounter   Received notification from CoverMyMeds that prior authorization for Emgality  120MG /ML auto-injectors (migraine) is required/requested.   Insurance verification completed.   The patient is insured through [facility].   Per test claim: PA required; PA submitted to above mentioned insurance via Latent Key/confirmation #/EOC [id number] Status is pending

## 2024-01-09 ENCOUNTER — Other Ambulatory Visit: Payer: Self-pay

## 2024-01-09 ENCOUNTER — Encounter: Payer: Self-pay | Admitting: Pharmacist

## 2024-01-12 ENCOUNTER — Other Ambulatory Visit: Payer: Self-pay

## 2024-01-16 ENCOUNTER — Other Ambulatory Visit: Payer: Self-pay

## 2024-01-16 ENCOUNTER — Encounter: Payer: Self-pay | Admitting: Pharmacist

## 2024-01-17 ENCOUNTER — Other Ambulatory Visit (HOSPITAL_COMMUNITY): Payer: Self-pay

## 2024-01-19 ENCOUNTER — Other Ambulatory Visit: Payer: Self-pay

## 2024-01-30 ENCOUNTER — Ambulatory Visit

## 2024-02-06 ENCOUNTER — Ambulatory Visit

## 2024-02-14 ENCOUNTER — Ambulatory Visit

## 2024-02-20 ENCOUNTER — Ambulatory Visit: Admitting: Gastroenterology

## 2024-02-27 ENCOUNTER — Ambulatory Visit: Admitting: Internal Medicine

## 2024-03-01 ENCOUNTER — Ambulatory Visit: Admitting: Internal Medicine

## 2024-03-14 ENCOUNTER — Telehealth (HOSPITAL_COMMUNITY): Admitting: Psychiatry

## 2024-07-12 ENCOUNTER — Telehealth: Admitting: Adult Health
